# Patient Record
Sex: Female | Born: 1960 | Race: Black or African American | Hispanic: No | Marital: Single | Smoking: Never smoker
Health system: Southern US, Community
[De-identification: ages and names within clinical notes are randomized; demographics above are authoritative.]

## PROBLEM LIST (undated history)

## (undated) ENCOUNTER — Non-Acute Institutional Stay: Payer: MEDICAID

## (undated) ENCOUNTER — Ambulatory Visit

## (undated) ENCOUNTER — Encounter: Attending: Geriatric Medicine | Primary: Geriatric Medicine

## (undated) ENCOUNTER — Ambulatory Visit
Payer: MEDICAID | Attending: Rehabilitative and Restorative Service Providers" | Primary: Rehabilitative and Restorative Service Providers"

## (undated) ENCOUNTER — Encounter

## (undated) ENCOUNTER — Telehealth

## (undated) ENCOUNTER — Ambulatory Visit: Payer: MEDICAID

## (undated) ENCOUNTER — Non-Acute Institutional Stay: Payer: MEDICAID | Attending: Geriatric Medicine | Primary: Geriatric Medicine

## (undated) ENCOUNTER — Ambulatory Visit: Payer: MEDICAID | Attending: Nephrology | Primary: Nephrology

## (undated) ENCOUNTER — Encounter: Attending: Adult Health | Primary: Adult Health

## (undated) ENCOUNTER — Ambulatory Visit: Attending: Radiation Oncology | Primary: Radiation Oncology

## (undated) ENCOUNTER — Encounter: Attending: Radiation Oncology | Primary: Radiation Oncology

## (undated) ENCOUNTER — Encounter: Attending: Pharmacist | Primary: Pharmacist

## (undated) ENCOUNTER — Inpatient Hospital Stay

## (undated) ENCOUNTER — Encounter: Payer: MEDICAID | Attending: Geriatric Medicine | Primary: Geriatric Medicine

## (undated) ENCOUNTER — Encounter: Attending: Anesthesiology | Primary: Anesthesiology

## (undated) ENCOUNTER — Non-Acute Institutional Stay: Payer: MEDICAID | Attending: Hematology & Oncology | Primary: Hematology & Oncology

## (undated) ENCOUNTER — Encounter: Payer: MEDICAID | Attending: Nephrology | Primary: Nephrology

## (undated) ENCOUNTER — Encounter
Payer: MEDICAID | Attending: Rehabilitative and Restorative Service Providers" | Primary: Rehabilitative and Restorative Service Providers"

## (undated) ENCOUNTER — Telehealth: Attending: Hematology & Oncology | Primary: Hematology & Oncology

## (undated) ENCOUNTER — Telehealth: Attending: Internal Medicine | Primary: Internal Medicine

## (undated) ENCOUNTER — Telehealth: Attending: Geriatric Medicine | Primary: Geriatric Medicine

## (undated) ENCOUNTER — Telehealth: Attending: Pharmacist | Primary: Pharmacist

## (undated) ENCOUNTER — Ambulatory Visit: Attending: Orthopaedic Surgery | Primary: Orthopaedic Surgery

## (undated) ENCOUNTER — Ambulatory Visit: Payer: MEDICAID | Attending: Adult Health | Primary: Adult Health

## (undated) ENCOUNTER — Encounter: Payer: MEDICAID | Attending: Adult Health | Primary: Adult Health

## (undated) ENCOUNTER — Telehealth
Attending: Pharmacist Clinician (PhC)/ Clinical Pharmacy Specialist | Primary: Pharmacist Clinician (PhC)/ Clinical Pharmacy Specialist

## (undated) ENCOUNTER — Encounter: Payer: MEDICAID | Attending: Hematology & Oncology | Primary: Hematology & Oncology

## (undated) ENCOUNTER — Ambulatory Visit
Payer: MEDICAID | Attending: Student in an Organized Health Care Education/Training Program | Primary: Student in an Organized Health Care Education/Training Program

## (undated) ENCOUNTER — Ambulatory Visit
Attending: Rehabilitative and Restorative Service Providers" | Primary: Rehabilitative and Restorative Service Providers"

## (undated) DIAGNOSIS — E119 Type 2 diabetes mellitus without complications: Secondary | ICD-10-CM

## (undated) DIAGNOSIS — I10 Essential (primary) hypertension: Secondary | ICD-10-CM

## (undated) HISTORY — PX: ABDOMINAL HYSTERECTOMY: SHX81

---

## 2005-01-19 ENCOUNTER — Emergency Department: Payer: Self-pay | Admitting: Emergency Medicine

## 2007-10-29 ENCOUNTER — Other Ambulatory Visit: Payer: Self-pay

## 2007-10-29 ENCOUNTER — Emergency Department: Payer: Self-pay | Admitting: Emergency Medicine

## 2008-05-23 ENCOUNTER — Emergency Department: Payer: Self-pay | Admitting: Emergency Medicine

## 2013-08-05 ENCOUNTER — Inpatient Hospital Stay: Payer: Self-pay | Admitting: Internal Medicine

## 2013-08-05 DIAGNOSIS — I517 Cardiomegaly: Secondary | ICD-10-CM

## 2013-08-05 LAB — TROPONIN I
Troponin-I: <0.02 ng/mL
Troponin-I: <0.02 ng/mL

## 2013-08-05 LAB — BASIC METABOLIC PANEL
Anion Gap: 7 (ref 7–16)
BUN: 17 mg/dL (ref 7–18)
Calcium, Total: 8.6 mg/dL (ref 8.5–10.1)
Chloride: 106 mmol/L (ref 98–107)
Co2: 26 mmol/L (ref 21–32)
Creatinine: 1.01 mg/dL (ref 0.60–1.30)
EGFR (Non-African Amer.): >60
Glucose: 238 mg/dL — ABNORMAL HIGH (ref 65–99)
Osmolality: 287 (ref 275–301)
Potassium: 3.5 mmol/L (ref 3.5–5.1)
Sodium: 139 mmol/L (ref 136–145)

## 2013-08-05 LAB — CBC
HCT: 40.7 % (ref 35.0–47.0)
HGB: 13.6 g/dL (ref 12.0–16.0)
MCH: 31 pg (ref 26.0–34.0)
MCHC: 33.4 g/dL (ref 32.0–36.0)
MCV: 93 fL (ref 80–100)
Platelet: 270 10*3/uL (ref 150–440)
RBC: 4.39 10*6/uL (ref 3.80–5.20)
RDW: 13.6 % (ref 11.5–14.5)
WBC: 9.8 10*3/uL (ref 3.6–11.0)

## 2013-08-05 LAB — LIPID PANEL
Cholesterol: 200 mg/dL (ref 0–200)
HDL Cholesterol: 69 mg/dL — ABNORMAL HIGH (ref 40–60)
Ldl Cholesterol, Calc: 106 mg/dL — ABNORMAL HIGH (ref 0–100)
Triglycerides: 123 mg/dL (ref 0–200)
VLDL CHOLESTEROL, CALC: 25 mg/dL (ref 5–40)

## 2013-08-05 LAB — DRUG SCREEN, URINE
Amphetamines, Ur Screen: NEGATIVE (ref ?–1000)
Barbiturates, Ur Screen: NEGATIVE (ref ?–200)
Benzodiazepine, Ur Scrn: NEGATIVE (ref ?–200)
CANNABINOID 50 NG, UR ~~LOC~~: POSITIVE (ref ?–50)
Cocaine Metabolite,Ur ~~LOC~~: NEGATIVE (ref ?–300)
MDMA (Ecstasy)Ur Screen: NEGATIVE (ref ?–500)
Methadone, Ur Screen: NEGATIVE (ref ?–300)
Opiate, Ur Screen: NEGATIVE (ref ?–300)
Phencyclidine (PCP) Ur S: NEGATIVE (ref ?–25)
TRICYCLIC, UR SCREEN: NEGATIVE (ref ?–1000)

## 2013-08-05 LAB — CK TOTAL AND CKMB (NOT AT ARMC)
CK, Total: 112 U/L
CK, Total: 108 U/L
CK, Total: 98 U/L
CK-MB: 1.2 ng/mL (ref 0.5–3.6)
CK-MB: 0.8 ng/mL (ref 0.5–3.6)
CK-MB: 0.7 ng/mL (ref 0.5–3.6)

## 2013-08-05 LAB — HEMOGLOBIN A1C: Hemoglobin A1C: 8.2 % — ABNORMAL HIGH (ref 4.2–6.3)

## 2013-08-05 LAB — D-DIMER(ARMC): D-DIMER: 561 ng/mL

## 2013-08-05 LAB — PRO B NATRIURETIC PEPTIDE: B-Type Natriuretic Peptide: 876 pg/mL — ABNORMAL HIGH (ref 0–125)

## 2013-08-06 LAB — BASIC METABOLIC PANEL
Anion Gap: 9 (ref 7–16)
BUN: 19 mg/dL — ABNORMAL HIGH (ref 7–18)
CHLORIDE: 97 mmol/L — AB (ref 98–107)
CO2: 26 mmol/L (ref 21–32)
Calcium, Total: 9.2 mg/dL (ref 8.5–10.1)
Creatinine: 1.09 mg/dL (ref 0.60–1.30)
EGFR (African American): >60
GFR CALC NON AF AMER: 57 — AB
GLUCOSE: 235 mg/dL — AB (ref 65–99)
Osmolality: 274 (ref 275–301)
Potassium: 3.4 mmol/L — ABNORMAL LOW (ref 3.5–5.1)
SODIUM: 132 mmol/L — AB (ref 136–145)

## 2013-08-06 LAB — CBC WITH DIFFERENTIAL/PLATELET
BASOS PCT: 0.8 %
Basophil #: 0.1 10*3/uL (ref 0.0–0.1)
Eosinophil #: 0 10*3/uL (ref 0.0–0.7)
Eosinophil %: 0.4 %
HCT: 40.6 % (ref 35.0–47.0)
HGB: 13.8 g/dL (ref 12.0–16.0)
LYMPHS PCT: 15.2 %
Lymphocyte #: 1.8 10*3/uL (ref 1.0–3.6)
MCH: 30.8 pg (ref 26.0–34.0)
MCHC: 33.9 g/dL (ref 32.0–36.0)
MCV: 91 fL (ref 80–100)
Monocyte #: 1.2 x10 3/mm — ABNORMAL HIGH (ref 0.2–0.9)
Monocyte %: 10.2 %
Neutrophil #: 8.6 10*3/uL — ABNORMAL HIGH (ref 1.4–6.5)
Neutrophil %: 73.4 %
Platelet: 275 10*3/uL (ref 150–440)
RBC: 4.47 10*6/uL (ref 3.80–5.20)
RDW: 13.5 % (ref 11.5–14.5)
WBC: 11.8 10*3/uL — ABNORMAL HIGH (ref 3.6–11.0)

## 2013-08-06 LAB — MAGNESIUM: MAGNESIUM: 1.7 mg/dL — AB

## 2013-08-22 ENCOUNTER — Emergency Department: Payer: Self-pay | Admitting: Emergency Medicine

## 2013-08-22 LAB — BASIC METABOLIC PANEL
Anion Gap: 5 — ABNORMAL LOW (ref 7–16)
BUN: 20 mg/dL — ABNORMAL HIGH (ref 7–18)
CO2: 26 mmol/L (ref 21–32)
Calcium, Total: 9.3 mg/dL (ref 8.5–10.1)
Chloride: 105 mmol/L (ref 98–107)
Creatinine: 1.09 mg/dL (ref 0.60–1.30)
EGFR (African American): >60
EGFR (Non-African Amer.): 57 — ABNORMAL LOW
Glucose: 223 mg/dL — ABNORMAL HIGH (ref 65–99)
OSMOLALITY: 281 (ref 275–301)
Potassium: 3.3 mmol/L — ABNORMAL LOW (ref 3.5–5.1)
Sodium: 136 mmol/L (ref 136–145)

## 2013-08-22 LAB — CBC
HCT: 41.1 % (ref 35.0–47.0)
HGB: 13.8 g/dL (ref 12.0–16.0)
MCH: 30.9 pg (ref 26.0–34.0)
MCHC: 33.6 g/dL (ref 32.0–36.0)
MCV: 92 fL (ref 80–100)
PLATELETS: 329 10*3/uL (ref 150–440)
RBC: 4.46 10*6/uL (ref 3.80–5.20)
RDW: 13.7 % (ref 11.5–14.5)
WBC: 10.6 10*3/uL (ref 3.6–11.0)

## 2013-08-22 LAB — TROPONIN I

## 2013-08-22 LAB — PRO B NATRIURETIC PEPTIDE: B-Type Natriuretic Peptide: 1038 pg/mL — ABNORMAL HIGH (ref 0–125)

## 2014-04-30 ENCOUNTER — Emergency Department: Payer: Self-pay | Admitting: Emergency Medicine

## 2014-04-30 LAB — CBC WITH DIFFERENTIAL/PLATELET
Basophil #: 0.1 10*3/uL (ref 0.0–0.1)
Basophil %: 0.5 %
EOS PCT: 0 %
Eosinophil #: 0 10*3/uL (ref 0.0–0.7)
HCT: 45.5 % (ref 35.0–47.0)
HGB: 15.1 g/dL (ref 12.0–16.0)
Lymphocyte #: 0.9 10*3/uL — ABNORMAL LOW (ref 1.0–3.6)
Lymphocyte %: 7.4 %
MCH: 30.6 pg (ref 26.0–34.0)
MCHC: 33.2 g/dL (ref 32.0–36.0)
MCV: 92 fL (ref 80–100)
MONOS PCT: 3.9 %
Monocyte #: 0.5 x10 3/mm (ref 0.2–0.9)
NEUTROS ABS: 10.5 10*3/uL — AB (ref 1.4–6.5)
Neutrophil %: 88.2 %
Platelet: 230 10*3/uL (ref 150–440)
RBC: 4.94 10*6/uL (ref 3.80–5.20)
RDW: 13.5 % (ref 11.5–14.5)
WBC: 11.9 10*3/uL — AB (ref 3.6–11.0)

## 2014-04-30 LAB — COMPREHENSIVE METABOLIC PANEL
Albumin: 3.8 g/dL (ref 3.4–5.0)
Alkaline Phosphatase: 61 U/L (ref 46–116)
Anion Gap: 12 (ref 7–16)
BUN: 11 mg/dL (ref 7–18)
Bilirubin,Total: 0.5 mg/dL (ref 0.2–1.0)
CHLORIDE: 96 mmol/L — AB (ref 98–107)
Calcium, Total: 9.2 mg/dL (ref 8.5–10.1)
Co2: 26 mmol/L (ref 21–32)
Creatinine: 1.18 mg/dL (ref 0.60–1.30)
EGFR (Non-African Amer.): 51 — ABNORMAL LOW
Glucose: 303 mg/dL — ABNORMAL HIGH (ref 65–99)
OSMOLALITY: 279 (ref 275–301)
POTASSIUM: 3.1 mmol/L — AB (ref 3.5–5.1)
SGOT(AST): 21 U/L (ref 15–37)
SGPT (ALT): 22 U/L (ref 14–63)
SODIUM: 134 mmol/L — AB (ref 136–145)
Total Protein: 7.9 g/dL (ref 6.4–8.2)

## 2014-04-30 LAB — LIPASE, BLOOD: Lipase: 153 U/L (ref 73–393)

## 2014-07-19 NOTE — H&P (Signed)
PATIENT NAME:  Margaret Herrera, Margaret Herrera MR#:  [id number] DATE OF BIRTH:  1960-12-05  DATE OF ADMISSION:  08/05/2013  ADMITTING PHYSICIAN: Gladstone Lighter, MD  PRIMARY CARE PHYSICIAN: [facility]   CHIEF COMPLAINT: Chest tightness and difficulty breathing.   HISTORY OF PRESENT ILLNESS: Margaret Herrera is a 54 year old African American female with past medical history significant for hypertension and insulin-dependent diabetes mellitus who was doing fine up until late last night, presents to the hospital secondary to sudden onset of dyspnea that woke her up from the middle of the sleep associated with chest tightness. The patient works regularly. She works from home. She has desk job. She was feeling fine all day yesterday. She went to bed last night and suddenly woke up in the middle of the night around 1:00 a.m. with shortness of breath, could not breathe at all, could not lie flat, associated with chest tightness and some nausea. She presented to the ER. She was hypoxic. She could not speak in full sentences and very tachypneic. She was placed on a BiPAP and chest x-ray showed pulmonary congestion so she is being treated for pulmonary edema at this time. Also noted her blood pressure systolic was greater than 938H and diastolic greater than 829H in spite of giving her medications to get it down. She is now started on a nitro drip. The patient says that she had a similar episode about a few months ago at [facility]. They did not admit her, monitored in the ER, managed her blood pressure and asked her to follow up with [facility] clinic. She did follow-up with her PCP and her blood pressure medication was changed. However, she is only on lisinopril 10 mg tablet and it seems inadequate to control her blood pressure at this time.   PAST MEDICAL HISTORY: 1.  Hypertension.  2.  Insulin-dependent diabetes mellitus.   PAST SURGICAL HISTORY: C-section.   ALLERGIES TO MEDICATIONS: No known drug allergies.    CURRENT HOME MEDICATIONS:  1.  Lisinopril 10 mg p.o. daily. 2.  Lantus 15 units subcutaneous daily.   SOCIAL HISTORY: Lives at home with her son. 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 No smoking or alcohol use.   FAMILY HISTORY: Dad with diabetes mellitus.  REVIEW OF SYSTEMS: CONSTITUTIONAL: No fever, fatigue or weakness.  EYES: No blurred vision, double vision, inflammation or glaucoma.  ENT: No tinnitus, ear pain, hearing loss, epistaxis or discharge.  RESPIRATORY: No cough, wheeze, or hemoptysis. Positive for dyspnea on exertion. No COPD.  CARDIOVASCULAR: Positive for chest tightness. Positive for orthopnea. No pedal edema, arrhythmia. Positive for dyspnea on exertion. No palpitations or syncope.  GASTROINTESTINAL: Positive for nausea. No vomiting, diarrhea, hematemesis, melena, or abdominal pain.  GENITOURINARY: No dysuria, hematuria, renal calculus, frequency, or incontinence.  ENDOCRINE: No polyuria, nocturia, thyroid problems, heat or cold intolerance.  HEMATOLOGY: No anemia, easy bruising or bleeding.  SKIN: No acne, rash or lesions.  MUSCULOSKELETAL: No neck, back, shoulder pain, arthritis or gout.  NEUROLOGIC: No numbness, weakness, CVA, TIA or seizures.  PSYCHOLOGICAL: No anxiety, insomnia or depression.   PHYSICAL EXAMINATION: VITAL SIGNS: Temperature 98.5 degrees Fahrenheit, pulse 103, respirations 28, blood pressure right now is 210/115 and sats 95% on 30% FiO2 on 12/5 pressures of BiPAP.  GENERAL: Well-built, well-nourished female lying in bed, not in any acute distress.  HEENT: Normocephalic, atraumatic. Pupils equal, round, and reacting to light. Anicteric sclerae. Extraocular movements intact. Oropharynx is clear without erythema, mass or exudates.  NECK: Supple. No  thyromegaly, JVD or carotid bruits. No lymphadenopathy.  LUNGS: She is moving air bilaterally. Fine bibasilar or crackles heard. No rhonchi or wheezes. No use of accessory muscles for breathing  at this time. Currently on a BiPAP.  CARDIOVASCULAR: S1, S2 regular rate and rhythm. No murmurs, rubs, or gallops.  ABDOMEN: Soft, nontender, nondistended. No hepatosplenomegaly. Normal bowel sounds.  EXTREMITIES: No pedal edema. No clubbing or cyanosis. 2+ dorsalis pedis pulses palpable bilaterally.  SKIN: No acne, rash or lesions.  LYMPHATICS: No cervical or inguinal lymphadenopathy.  NEUROLOGIC: Cranial nerves II through XII remain grossly intact. Motor strength 5/5 in all 4 extremities. Sensation is intact. No cerebellar dysfunction signs seen.  PSYCHOLOGICAL: The patient is awake, alert, and oriented x3.   DIAGNOSTIC DATA: WBC 9.8, hemoglobin 13.6, hematocrit 40.7, platelet count 270,000.   Sodium 139, potassium 3.5, chloride 106, bicarb 26, BUN 17, creatinine 1, glucose 238, and calcium 8.6. BNP is elevated at 876.  Chest x-ray is showing borderline heart size without vascular congestion. No airspace disease or consolidation noted.  First troponin is negative.   CT of the chest is showing degree of right-sided congestive heart failure with bibasilar consolidation, pleural effusions bilaterally seen. No pulmonary embolus noted.   EKG is showing sinus tachycardia with LVH pattern, heart rate of 104.   ASSESSMENT AND PLAN: This is a 54 year old female with history of hypertension and insulin-dependent diabetes mellitus presented with sudden onset of dyspnea, hypoxemia, and chest tightness. 1.  Acute respiratory distress with tachypnea and inability to speak in full sentences secondary to acute pulmonary edema. Likely triggered by malignant hypertension. Echo has been ordered. She is started on IV Lasix at this time. Followup chest x-ray in the a.m. CT of the chest did not show any PE, other than pulmonary vascular congestion and pleural effusions. She will be on BiPAP and be monitored in CCU.  2.  Malignant hypertension. Currently on nitro drip. Continue and try to wean off. I am starting  her on lisinopril/HCTZ and metoprolol.  3.  Insulin-dependent diabetes mellitus. Restart Lantus, check HbA1c and also on sliding scale insulin.  4.  Deep vein thrombosis prophylaxis with Lovenox.   CODE STATUS: FULL.  CRITICAL CARE TIME SPENT ON ADMISSION: 60 minutes.  ____________________________ Gladstone Lighter, MD rk:sb D: 08/05/2013 08:23:25 ET T: 08/05/2013 08:54:18 ET JOB#: [id number]  cc: Gladstone Lighter, MD, <Dictator> [facility] Gladstone Lighter MD ELECTRONICALLY SIGNED 08/05/2013 13:38

## 2014-07-19 NOTE — Discharge Summary (Signed)
PATIENT NAME:  Margaret Herrera, EBANKS MR#:  [id number] DATE OF BIRTH:  Aug 10, 1960  DATE OF ADMISSION:  08/05/2013 DATE OF DISCHARGE:  08/07/2013  DISCHARGE DIAGNOSES: 1.  Acute on chronic systolic and diastolic heart failure.  2.  Malignant hypertension.  3.  Diabetes mellitus type 2.  4.  Hyperlipidemia.   DISCHARGE MEDICATIONS: 1.  Lisinopril 10 mg p.o. daily.  2.  Lantus 15 units daily.  3.  Atorvastatin 20 mg p.o. daily.  4.  Hydralazine 50 mg p.o. t.i.d.  5.  Lasix 40 mg p.o. daily.  6.  Metoprolol tartrate 25 mg p.o. b.i.d.   Please note that all of her medications, except lisinopril and Lasix, are new. The patient's lisinopril and Lantus are home medications, but the Lopressor, statin and hydralazine are new medications.   DIET: Low sodium, low fat, low cholesterol.   CONSULTATIONS:  None.   HOSPITAL COURSE: This patient is a 54 year old female patient who came in because of chest tightness and difficulty breathing. The patient's blood pressure was very elevated on admission to systolic 163/846. The patient could not speak full sentences and she also was hypoxic, according to history and physical. The patient was admitted to ICU because of acute respiratory failure with inability to talk due to pulmonary edema and malignant hypertension. The patient was started on nitro drip. Her lab data on admission was unremarkable. CT of the chest showed right-sided CHF with consolidation. The patient's chest x-ray showed vascular congestion without any infiltrate. Labs on admission showed sodium 139, potassium 3.5, chloride 106, bicarb 26, BUN 17, creatinine 1. The patient was admitted to ICU.  CT chest showed vascular congestion and pleural effusion and did not show any PE. The patient was in ICU the 11th and 12th, and she was on nitro drip on May 11th  and 12th; that is more than 24 hours due to persistent elevated blood pressure. The patient was started on p.o. metoprolol along with hydralazine and we  weaned off the nitro drip.  The patient's echocardiogram showed EF of 25% to 30% with severely decreased LV function and impaired relaxation of LV diastolic filling. The patient's blood pressure nicely improved off the nitro drip, and the patient able to take the p.o. medications, so on the 13th, in the morning when I saw her, she was asymptomatic, did not have any trouble breathing. The patient's blood pressure stayed at around 146/83 and heart rate of 80, so we discharged her home in stable condition, wrote prescription for metoprolol, hydralazine and also statins. The patient went home in stable condition.   DISCHARGE PHYSICAL EXAMINATION:  VITAL SIGNS:  Temperature is 98.6, heart rate 82, blood pressure 150/90.  CARDIOVASCULAR:  Rate regular.  LUNGS:  Clear to auscultation. No wheeze. No rales.   .  IMAGING AND LABORATORY DATA DURING THE HOSPITAL DAY: Chest x-ray on May 12th showed resolution of pulmonary edema. WBC, on May 12th, 11.8, hemoglobin 13.8, hematocrit 40.6, platelets 275. Electrolytes, on May 12th showed sodium 132, potassium 3.4, chloride 97, bicarb 20, BUN 19, creatinine 1, glucose 235.  The patient's echocardiogram showed EF of 25% to 30% with moderately severe decreased LV function and also impaired relaxation pattern of LV diastolic filling.  The patient's troponins have been negative. CT angio chest showed no pulmonary emboli. The patient has right-sided CHF, bibasilar consolidation. The patient's urine toxicology is positive for cannabinoids. The patient had LDL of 106.  Hemoglobin A1c of 8.2. EKG showed sinus tachycardia, 104 beats per minute on admission.  TIME SPENT ON DISCHARGE PREPARATION:  More than 35 minutes and discharged home.   ____________________________ Epifanio Lesches, MD sk:dmm D: 08/09/2013 12:16:56 ET T: 08/09/2013 13:27:00 ET JOB#: [id number]  cc: Epifanio Lesches, MD, <Dictator> Epifanio Lesches MD ELECTRONICALLY SIGNED 08/21/2013 9:48

## 2014-09-30 ENCOUNTER — Other Ambulatory Visit: Payer: Self-pay | Admitting: Obstetrics and Gynecology

## 2014-09-30 DIAGNOSIS — Z1239 Encounter for other screening for malignant neoplasm of breast: Secondary | ICD-10-CM

## 2015-03-22 ENCOUNTER — Emergency Department
Admission: EM | Admit: 2015-03-22 | Discharge: 2015-03-22 | Disposition: A | Discharge status: Home / Self Care | Payer: BLUE CROSS/BLUE SHIELD | Attending: Emergency Medicine | Admitting: Emergency Medicine

## 2015-03-22 ENCOUNTER — Emergency Department: Payer: BLUE CROSS/BLUE SHIELD

## 2015-03-22 ENCOUNTER — Encounter: Payer: Self-pay | Admitting: Emergency Medicine

## 2015-03-22 DIAGNOSIS — Z79899 Other long term (current) drug therapy: Secondary | ICD-10-CM | POA: Diagnosis not present

## 2015-03-22 DIAGNOSIS — E1165 Type 2 diabetes mellitus with hyperglycemia: Secondary | ICD-10-CM | POA: Diagnosis not present

## 2015-03-22 DIAGNOSIS — L700 Acne vulgaris: Secondary | ICD-10-CM | POA: Insufficient documentation

## 2015-03-22 DIAGNOSIS — I10 Essential (primary) hypertension: Secondary | ICD-10-CM | POA: Insufficient documentation

## 2015-03-22 DIAGNOSIS — L709 Acne, unspecified: Secondary | ICD-10-CM

## 2015-03-22 DIAGNOSIS — R739 Hyperglycemia, unspecified: Secondary | ICD-10-CM

## 2015-03-22 DIAGNOSIS — Z794 Long term (current) use of insulin: Secondary | ICD-10-CM | POA: Insufficient documentation

## 2015-03-22 DIAGNOSIS — M6281 Muscle weakness (generalized): Secondary | ICD-10-CM | POA: Insufficient documentation

## 2015-03-22 DIAGNOSIS — R531 Weakness: Secondary | ICD-10-CM

## 2015-03-22 HISTORY — DX: Essential (primary) hypertension: I10

## 2015-03-22 HISTORY — DX: Type 2 diabetes mellitus without complications: E11.9

## 2015-03-22 LAB — COMPREHENSIVE METABOLIC PANEL
ALT: 18 U/L (ref 14–54)
AST: 21 U/L (ref 15–41)
Albumin: 4.1 g/dL (ref 3.5–5.0)
Alkaline Phosphatase: 47 U/L (ref 38–126)
Anion gap: 8 (ref 5–15)
BUN: 48 mg/dL — ABNORMAL HIGH (ref 6–20)
CHLORIDE: 102 mmol/L (ref 101–111)
CO2: 24 mmol/L (ref 22–32)
CREATININE: 1.38 mg/dL — AB (ref 0.44–1.00)
Calcium: 8.9 mg/dL (ref 8.9–10.3)
GFR, EST AFRICAN AMERICAN: 49 mL/min — AB (ref >60)
GFR, EST NON AFRICAN AMERICAN: 42 mL/min — AB (ref >60)
Glucose, Bld: 310 mg/dL — ABNORMAL HIGH (ref 65–99)
Potassium: 4.2 mmol/L (ref 3.5–5.1)
Sodium: 134 mmol/L — ABNORMAL LOW (ref 135–145)
Total Bilirubin: 0.9 mg/dL (ref 0.3–1.2)
Total Protein: 7.1 g/dL (ref 6.5–8.1)

## 2015-03-22 LAB — CBC WITH DIFFERENTIAL/PLATELET
Basophils Absolute: 0.1 10*3/uL (ref 0–0.1)
Basophils Relative: 2 %
EOS PCT: 3 %
Eosinophils Absolute: 0.2 10*3/uL (ref 0–0.7)
HCT: 37.3 % (ref 35.0–47.0)
Hemoglobin: 12.7 g/dL (ref 12.0–16.0)
LYMPHS ABS: 2.2 10*3/uL (ref 1.0–3.6)
LYMPHS PCT: 30 %
MCH: 31 pg (ref 26.0–34.0)
MCHC: 34 g/dL (ref 32.0–36.0)
MCV: 91.3 fL (ref 80.0–100.0)
MONO ABS: 0.9 10*3/uL (ref 0.2–0.9)
MONOS PCT: 12 %
Neutro Abs: 4 10*3/uL (ref 1.4–6.5)
Neutrophils Relative %: 53 %
PLATELETS: 242 10*3/uL (ref 150–440)
RBC: 4.08 MIL/uL (ref 3.80–5.20)
RDW: 13.3 % (ref 11.5–14.5)
WBC: 7.5 10*3/uL (ref 3.6–11.0)

## 2015-03-22 LAB — URINALYSIS COMPLETE WITH MICROSCOPIC (ARMC ONLY)
BILIRUBIN URINE: NEGATIVE
Bacteria, UA: NONE SEEN
Hgb urine dipstick: NEGATIVE
KETONES UR: NEGATIVE mg/dL
LEUKOCYTES UA: NEGATIVE
Nitrite: NEGATIVE
PH: 5 (ref 5.0–8.0)
Protein, ur: 100 mg/dL — AB
Specific Gravity, Urine: 1.01 (ref 1.005–1.030)

## 2015-03-22 LAB — GLUCOSE, CAPILLARY: Glucose-Capillary: 304 mg/dL — ABNORMAL HIGH (ref 65–99)

## 2015-03-22 LAB — TROPONIN I

## 2015-03-22 LAB — ETHANOL: ALCOHOL ETHYL (B): 5 mg/dL — AB (ref <5)

## 2015-03-22 MED ORDER — SODIUM CHLORIDE 0.9 % IV BOLUS (SEPSIS)
1000.0000 mL | Freq: Once | INTRAVENOUS | Status: AC
  Administered 2015-03-22: 1000 mL via INTRAVENOUS

## 2015-03-22 NOTE — ED Notes (Signed)
Pt reports lower extremity weakness intermittent x 1 month, most recent episode starting a few hours ago.  Pt also states hyperglycemia, pt hx insulin dependence diabetes.  Pt denies any pain, respirations equal and unlabored skin warm and dry.

## 2015-03-22 NOTE — ED Notes (Signed)
NAD noted at time of D/C. Pt denies questions or concerns. Pt ambulatory to the lobby at this time.  

## 2015-03-22 NOTE — Discharge Instructions (Signed)
1. Drink plenty of fluids daily. 2. Return to the ER for worsening symptoms, persistent vomiting, difficulty breathing or other concerns.  Hyperglycemia Hyperglycemia occurs when the glucose (sugar) in your blood is too high. Hyperglycemia can happen for many reasons, but it most often happens to people who do not know they have diabetes or are not managing their diabetes properly.  CAUSES  Whether you have diabetes or not, there are other causes of hyperglycemia. Hyperglycemia can occur when you have diabetes, but it can also occur in other situations that you might not be as aware of, such as: Diabetes  If you have diabetes and are having problems controlling your blood glucose, hyperglycemia could occur because of some of the following reasons:  Not following your meal plan.  Not taking your diabetes medications or not taking it properly.  Exercising less or doing less activity than you normally do.  Being sick. Pre-diabetes  This cannot be ignored. Before people develop Type 2 diabetes, they almost always have "pre-diabetes." This is when your blood glucose levels are higher than normal, but not yet high enough to be diagnosed as diabetes. Research has shown that some long-term damage to the body, especially the heart and circulatory system, may already be occurring during pre-diabetes. If you take action to manage your blood glucose when you have pre-diabetes, you may delay or prevent Type 2 diabetes from developing. Stress  If you have diabetes, you may be "diet" controlled or on oral medications or insulin to control your diabetes. However, you may find that your blood glucose is higher than usual in the hospital whether you have diabetes or not. This is often referred to as "stress hyperglycemia." Stress can elevate your blood glucose. This happens because of hormones put out by the body during times of stress. If stress has been the cause of your high blood glucose, it can be followed  regularly by your caregiver. That way he/she can make sure your hyperglycemia does not continue to get worse or progress to diabetes. Steroids  Steroids are medications that act on the infection fighting system (immune system) to block inflammation or infection. One side effect can be a rise in blood glucose. Most people can produce enough extra insulin to allow for this rise, but for those who cannot, steroids make blood glucose levels go even higher. It is not unusual for steroid treatments to "uncover" diabetes that is developing. It is not always possible to determine if the hyperglycemia will go away after the steroids are stopped. A special blood test called an A1c is sometimes done to determine if your blood glucose was elevated before the steroids were started. SYMPTOMS  Thirsty.  Frequent urination.  Dry mouth.  Blurred vision.  Tired or fatigue.  Weakness.  Sleepy.  Tingling in feet or leg. DIAGNOSIS  Diagnosis is made by monitoring blood glucose in one or all of the following ways:  A1c test. This is a chemical found in your blood.  Fingerstick blood glucose monitoring.  Laboratory results. TREATMENT  First, knowing the cause of the hyperglycemia is important before the hyperglycemia can be treated. Treatment may include, but is not be limited to:  Education.  Change or adjustment in medications.  Change or adjustment in meal plan.  Treatment for an illness, infection, etc.  More frequent blood glucose monitoring.  Change in exercise plan.  Decreasing or stopping steroids.  Lifestyle changes. HOME CARE INSTRUCTIONS   Test your blood glucose as directed.  Exercise regularly. Your  caregiver will give you instructions about exercise. Pre-diabetes or diabetes which comes on with stress is helped by exercising.  Eat wholesome, balanced meals. Eat often and at regular, fixed times. Your caregiver or nutritionist will give you a meal plan to guide your sugar  intake.  Being at an ideal weight is important. If needed, losing as little as 10 to 15 pounds may help improve blood glucose levels. SEEK MEDICAL CARE IF:   You have questions about medicine, activity, or diet.  You continue to have symptoms (problems such as increased thirst, urination, or weight gain). SEEK IMMEDIATE MEDICAL CARE IF:   You are vomiting or have diarrhea.  Your breath smells fruity.  You are breathing faster or slower.  You are very sleepy or incoherent.  You have numbness, tingling, or pain in your feet or hands.  You have chest pain.  Your symptoms get worse even though you have been following your caregiver's orders.  If you have any other questions or concerns.   This information is not intended to replace advice given to you by your health care provider. Make sure you discuss any questions you have with your health care provider.   Document Released: 09/07/2000 Document Revised: 06/06/2011 Document Reviewed: 11/18/2014 Elsevier Interactive Patient Education 2016 [employer]  Weakness Weakness is a lack of strength. It may be felt all over the body (generalized) or in one specific part of the body (focal). Some causes of weakness can be serious. You may need further medical evaluation, especially if you are elderly or you have a history of immunosuppression (such as chemotherapy or HIV), kidney disease, heart disease, or diabetes. CAUSES  Weakness can be caused by many different things, including:  Infection.  Physical exhaustion.  Internal bleeding or other blood loss that results in a lack of red blood cells (anemia).  Dehydration. This cause is more common in elderly people.  Side effects or electrolyte abnormalities from medicines, such as pain medicines or sedatives.  Emotional distress, anxiety, or depression.  Circulation problems, especially severe peripheral arterial disease.  Heart disease, such as rapid atrial fibrillation,  bradycardia, or heart failure.  Nervous system disorders, such as Guillain-Barr syndrome, multiple sclerosis, or stroke. DIAGNOSIS  To find the cause of your weakness, your caregiver will take your history and perform a physical exam. Lab tests or X-rays may also be ordered, if needed. TREATMENT  Treatment of weakness depends on the cause of your symptoms and can vary greatly. HOME CARE INSTRUCTIONS   Rest as needed.  Eat a well-balanced diet.  Try to get some exercise every day.  Only take over-the-counter or prescription medicines as directed by your caregiver. SEEK MEDICAL CARE IF:   Your weakness seems to be getting worse or spreads to other parts of your body.  You develop new aches or pains. SEEK IMMEDIATE MEDICAL CARE IF:   You cannot perform your normal daily activities, such as getting dressed and feeding yourself.  You cannot walk up and down stairs, or you feel exhausted when you do so.  You have shortness of breath or chest pain.  You have difficulty moving parts of your body.  You have weakness in only one area of the body or on only one side of the body.  You have a fever.  You have trouble speaking or swallowing.  You cannot control your bladder or bowel movements.  You have black or bloody vomit or stools. MAKE SURE YOU:  Understand these instructions.  Will watch  your condition.  Will get help right away if you are not doing well or get worse.   This information is not intended to replace advice given to you by your health care provider. Make sure you discuss any questions you have with your health care provider.   Document Released: 03/14/2005 Document Revised: 09/13/2011 Document Reviewed: 05/13/2011 Elsevier Interactive Patient Education [employer].

## 2015-03-22 NOTE — ED Provider Notes (Signed)
[facility] Emergency Department Provider Note  ____________________________________________  Time seen: Approximately 5:00 AM  I have reviewed the triage vital signs and the nursing notes.   HISTORY  Chief Complaint Extremity Weakness and Hyperglycemia    HPI Margaret Herrera is a 54 y.o. female who presents to the ED from home with a chief complain of weakness. Patient complains of bilateral lower extremity weakness, intermittent x one month. States her PCP attributed to fluid buildup. She was unable to sleep at home tonight secondary to weakness and presents to ED for evaluation. Patient also states she has had some recent medicine changes and notes hyperglycemia. Baseline blood sugars in the 140s; has noted blood sugars in the 400s x the past several days.Spouse denies slurred speech, altered mentation. Patient denies headache, vision changes, focal weakness, fever, chills, chest pain, shortness of breath, abdominal pain, nausea, vomiting, diarrhea. Denies recent travel or trauma. Denies associated back pain, numbness or tingling. Nothing makes her symptoms better or worse.   Past Medical History  Diagnosis Date  . Diabetes mellitus without complication ([place])   . Hypertension     There are no active problems to display for this patient.   History reviewed. No pertinent past surgical history.  Current Outpatient Rx  Name  Route  Sig  Dispense  Refill  . furosemide (LASIX) 40 MG tablet   Oral   Take 40 mg by mouth daily.         . hydrALAZINE (APRESOLINE) 50 MG tablet   Oral   Take 100 mg by mouth 3 (three) times daily.         . hydrochlorothiazide (HYDRODIURIL) 25 MG tablet   Oral   Take 25 mg by mouth daily.         . insulin glargine (LANTUS) 100 UNIT/ML injection   Subcutaneous   Inject 40 Units into the skin daily.         . isosorbide mononitrate (IMDUR) 60 MG 24 hr tablet   Oral   Take 60 mg by mouth daily.         Marland Kitchen  lisinopril (PRINIVIL,ZESTRIL) 10 MG tablet   Oral   Take 10 mg by mouth daily.         . metoprolol (LOPRESSOR) 100 MG tablet   Oral   Take 100 mg by mouth 2 (two) times daily.           Allergies Review of patient's allergies indicates no known allergies.  History reviewed. No pertinent family history.  Social History Social History  Substance Use Topics  . Smoking status: Never Smoker   . Smokeless tobacco: None  . Alcohol Use: No    Review of Systems Constitutional: Positive for lower extremity weakness bilaterally. No fever/chills. Eyes: No visual changes. ENT: No sore throat. Cardiovascular: Denies chest pain. Respiratory: Denies shortness of breath. Gastrointestinal: No abdominal pain.  No nausea, no vomiting.  No diarrhea.  No constipation. Genitourinary: Negative for dysuria. Musculoskeletal: Negative for back pain. Skin: Negative for rash. Neurological: Negative for headaches, focal weakness or numbness.  10-point ROS otherwise negative.  ____________________________________________   PHYSICAL EXAM:  VITAL SIGNS: ED Triage Vitals  Enc Vitals Group     BP 03/22/15 0449 199/83 mmHg     Pulse Rate 03/22/15 0449 66     Resp 03/22/15 0449 19     Temp 03/22/15 0449 98.3 F (36.8 C)     Temp Source 03/22/15 0449 Oral     SpO2 03/22/15  0449 98 %     Weight 03/22/15 0449 180 lb (81.647 kg)     Height 03/22/15 0449 5\' 4"  (1.626 m)     Head Cir --      Peak Flow --      Pain Score 03/22/15 0450 0     Pain Loc --      Pain Edu? --      Excl. in [place]? --     Constitutional: Alert and oriented. Well appearing and in no acute distress. Eyes: Conjunctivae are normal. PERRL. EOMI. Head: Atraumatic. Nose: No congestion/rhinnorhea. Mouth/Throat: Mucous membranes are moist.  Oropharynx non-erythematous. Neck: No stridor.  No carotid bruits. Cardiovascular: Normal rate, regular rhythm. Grossly normal heart sounds.  Good peripheral circulation. Respiratory:  Normal respiratory effort.  No retractions. Lungs CTAB. Gastrointestinal: Soft and nontender. No distention. No abdominal bruits. No CVA tenderness. Musculoskeletal: No lower extremity tenderness nor edema.  No joint effusions. Neurologic:  Normal speech and language. CN II-XII intact. 5/5 motor strength and sensation in all extremities. No gross focal neurologic deficits are appreciated.  Skin:  Skin is warm, dry and intact. No rash noted. Psychiatric: Mood and affect are normal. Speech and behavior are normal.  ____________________________________________   LABS (all labs ordered are listed, but only abnormal results are displayed)  Labs Reviewed  GLUCOSE, CAPILLARY - Abnormal; Notable for the following:    Glucose-Capillary 304 (*)    All other components within normal limits  COMPREHENSIVE METABOLIC PANEL - Abnormal; Notable for the following:    Sodium 134 (*)    Glucose, Bld 310 (*)    BUN 48 (*)    Creatinine, Ser 1.38 (*)    GFR calc non Af Amer 42 (*)    GFR calc Af Amer 49 (*)    All other components within normal limits  URINALYSIS COMPLETEWITH MICROSCOPIC ([facility] ONLY) - Abnormal; Notable for the following:    Color, Urine STRAW (*)    APPearance CLEAR (*)    Glucose, UA >500 (*)    Protein, ur 100 (*)    Squamous Epithelial / LPF 0-5 (*)    All other components within normal limits  CBC WITH DIFFERENTIAL/PLATELET  TROPONIN I  ETHANOL   ____________________________________________  EKG  ED ECG REPORT I, SUNG,JADE J, the attending physician, personally viewed and interpreted this ECG.   Date: 03/22/2015  EKG Time: 0444  Rate: 65  Rhythm: normal EKG, normal sinus rhythm  Axis: Normal  Intervals:none  ST&T Change: Nonspecific  ____________________________________________  RADIOLOGY  CT head without contrast interpreted per Dr. Radene Knee: 1. No acute intracranial pathology seen on CT. 2. Mild small vessel ischemic microangiopathy.  Portable chest x-ray  (viewed by me, interpreted per Dr. Radene Knee): Mild cardiomegaly. Lungs remain grossly clear. ____________________________________________   PROCEDURES  Procedure(s) performed: None  Critical Care performed: No  ____________________________________________   INITIAL IMPRESSION / ASSESSMENT AND PLAN / ED COURSE  Pertinent labs & imaging results that were available during my care of the patient were reviewed by me and considered in my medical decision making (see chart for details).  54 year old female with hyperglycemia and a one-month history of bilateral lower extremity weakness. Will check screening lab work, CT head, initiate IV fluid resuscitation and reassess.  ----------------------------------------- 6:20 AM on 03/22/2015 -----------------------------------------  Patient feeling better. No focal neurological deficits; low suspicion for acute CVA. Strict return precautions given. Patient and spouse verbalize understanding and agree with plan of care. ____________________________________________   FINAL CLINICAL IMPRESSION(S) / ED  DIAGNOSES  Final diagnoses:  Hyperglycemia  Weakness  Hydration acne      Paulette Blanch, MD 03/22/15 [phone]

## 2015-09-29 ENCOUNTER — Emergency Department: Payer: BLUE CROSS/BLUE SHIELD

## 2015-09-29 ENCOUNTER — Emergency Department
Admission: EM | Admit: 2015-09-29 | Discharge: 2015-09-29 | Disposition: A | Discharge status: Home / Self Care | Payer: BLUE CROSS/BLUE SHIELD | Attending: Emergency Medicine | Admitting: Emergency Medicine

## 2015-09-29 ENCOUNTER — Encounter: Payer: Self-pay | Admitting: Emergency Medicine

## 2015-09-29 DIAGNOSIS — Z794 Long term (current) use of insulin: Secondary | ICD-10-CM | POA: Insufficient documentation

## 2015-09-29 DIAGNOSIS — N289 Disorder of kidney and ureter, unspecified: Secondary | ICD-10-CM | POA: Insufficient documentation

## 2015-09-29 DIAGNOSIS — R1084 Generalized abdominal pain: Secondary | ICD-10-CM | POA: Diagnosis not present

## 2015-09-29 DIAGNOSIS — I10 Essential (primary) hypertension: Secondary | ICD-10-CM | POA: Insufficient documentation

## 2015-09-29 DIAGNOSIS — Z7984 Long term (current) use of oral hypoglycemic drugs: Secondary | ICD-10-CM | POA: Insufficient documentation

## 2015-09-29 DIAGNOSIS — R112 Nausea with vomiting, unspecified: Secondary | ICD-10-CM | POA: Diagnosis present

## 2015-09-29 DIAGNOSIS — R739 Hyperglycemia, unspecified: Secondary | ICD-10-CM

## 2015-09-29 DIAGNOSIS — E1165 Type 2 diabetes mellitus with hyperglycemia: Secondary | ICD-10-CM | POA: Diagnosis not present

## 2015-09-29 LAB — URINALYSIS COMPLETE WITH MICROSCOPIC (ARMC ONLY)
Bilirubin Urine: NEGATIVE
Hgb urine dipstick: NEGATIVE
Ketones, ur: NEGATIVE mg/dL
Leukocytes, UA: NEGATIVE
Nitrite: NEGATIVE
PROTEIN: 30 mg/dL — AB
SPECIFIC GRAVITY, URINE: 1.013 (ref 1.005–1.030)
WBC UA: NONE SEEN WBC/hpf (ref 0–5)
pH: 6 (ref 5.0–8.0)

## 2015-09-29 LAB — BASIC METABOLIC PANEL
ANION GAP: 7 (ref 5–15)
BUN: 50 mg/dL — AB (ref 6–20)
CHLORIDE: 103 mmol/L (ref 101–111)
CO2: 24 mmol/L (ref 22–32)
Calcium: 8.7 mg/dL — ABNORMAL LOW (ref 8.9–10.3)
Creatinine, Ser: 1.68 mg/dL — ABNORMAL HIGH (ref 0.44–1.00)
GFR, EST AFRICAN AMERICAN: 38 mL/min — AB (ref >60)
GFR, EST NON AFRICAN AMERICAN: 33 mL/min — AB (ref >60)
Glucose, Bld: 321 mg/dL — ABNORMAL HIGH (ref 65–99)
POTASSIUM: 4.2 mmol/L (ref 3.5–5.1)
SODIUM: 134 mmol/L — AB (ref 135–145)

## 2015-09-29 LAB — COMPREHENSIVE METABOLIC PANEL
ALBUMIN: 4.2 g/dL (ref 3.5–5.0)
ALK PHOS: 47 U/L (ref 38–126)
ALT: 15 U/L (ref 14–54)
AST: 19 U/L (ref 15–41)
Anion gap: 10 (ref 5–15)
BUN: 55 mg/dL — AB (ref 6–20)
CALCIUM: 9.2 mg/dL (ref 8.9–10.3)
CHLORIDE: 98 mmol/L — AB (ref 101–111)
CO2: 24 mmol/L (ref 22–32)
CREATININE: 1.83 mg/dL — AB (ref 0.44–1.00)
GFR calc non Af Amer: 30 mL/min — ABNORMAL LOW (ref >60)
GFR, EST AFRICAN AMERICAN: 34 mL/min — AB (ref >60)
GLUCOSE: 402 mg/dL — AB (ref 65–99)
Potassium: 3.8 mmol/L (ref 3.5–5.1)
SODIUM: 132 mmol/L — AB (ref 135–145)
Total Bilirubin: 0.6 mg/dL (ref 0.3–1.2)
Total Protein: 7.7 g/dL (ref 6.5–8.1)

## 2015-09-29 LAB — CBC WITH DIFFERENTIAL/PLATELET
BASOS ABS: 0.1 10*3/uL (ref 0–0.1)
BASOS PCT: 1 %
EOS ABS: 0.1 10*3/uL (ref 0–0.7)
Eosinophils Relative: 1 %
HCT: 35.5 % (ref 35.0–47.0)
HEMOGLOBIN: 12.5 g/dL (ref 12.0–16.0)
Lymphocytes Relative: 8 %
Lymphs Abs: 0.9 10*3/uL — ABNORMAL LOW (ref 1.0–3.6)
MCH: 31.6 pg (ref 26.0–34.0)
MCHC: 35.1 g/dL (ref 32.0–36.0)
MCV: 89.9 fL (ref 80.0–100.0)
MONOS PCT: 5 %
Monocytes Absolute: 0.6 10*3/uL (ref 0.2–0.9)
NEUTROS ABS: 9.5 10*3/uL — AB (ref 1.4–6.5)
NEUTROS PCT: 85 %
Platelets: 244 10*3/uL (ref 150–440)
RBC: 3.95 MIL/uL (ref 3.80–5.20)
RDW: 13.3 % (ref 11.5–14.5)
WBC: 11.2 10*3/uL — AB (ref 3.6–11.0)

## 2015-09-29 LAB — LIPASE, BLOOD: Lipase: 93 U/L — ABNORMAL HIGH (ref 11–51)

## 2015-09-29 MED ORDER — SODIUM CHLORIDE 0.9 % IV SOLN
Freq: Once | INTRAVENOUS | Status: AC
  Administered 2015-09-29: 11:00:00 via INTRAVENOUS

## 2015-09-29 MED ORDER — ONDANSETRON HCL 4 MG PO TABS: 4.0000 mg | ORAL_TABLET | Freq: Every day | ORAL | Status: DC | PRN

## 2015-09-29 NOTE — Discharge Instructions (Signed)
Abdominal Pain, Adult Many things can cause abdominal pain. Usually, abdominal pain is not caused by a disease and will improve without treatment. It can often be observed and treated at home. Your health care provider will do a physical exam and possibly order blood tests and X-rays to help determine the seriousness of your pain. However, in many cases, more time must pass before a clear cause of the pain can be found. Before that point, your health care provider may not know if you need more testing or further treatment. HOME CARE INSTRUCTIONS Monitor your abdominal pain for any changes. The following actions may help to alleviate any discomfort you are experiencing:  Only take over-the-counter or prescription medicines as directed by your health care provider.  Do not take laxatives unless directed to do so by your health care provider.  Try a clear liquid diet (broth, tea, or water) as directed by your health care provider. Slowly move to a bland diet as tolerated. SEEK MEDICAL CARE IF:  You have unexplained abdominal pain.  You have abdominal pain associated with nausea or diarrhea.  You have pain when you urinate or have a bowel movement.  You experience abdominal pain that wakes you in the night.  You have abdominal pain that is worsened or improved by eating food.  You have abdominal pain that is worsened with eating fatty foods.  You have a fever. SEEK IMMEDIATE MEDICAL CARE IF:  Your pain does not go away within 2 hours.  You keep throwing up (vomiting).  Your pain is felt only in portions of the abdomen, such as the right side or the left lower portion of the abdomen.  You pass bloody or black tarry stools. MAKE SURE YOU:  Understand these instructions.  Will watch your condition.  Will get help right away if you are not doing well or get worse.   This information is not intended to replace advice given to you by your health care provider. Make sure you discuss  any questions you have with your health care provider.   Document Released: 12/22/2004 Document Revised: 12/03/2014 Document Reviewed: 11/21/2012 Chronic Kidney Disease Chronic kidney disease happens when the kidneys are damaged over a long period. The kidneys are two organs that do many important jobs in the body. These jobs include:  Removing wastes and extra fluids from the blood.  Making hormones that help to keep the body healthy.  Making sure that the body has the right amount of fluids and chemicals. Chronic kidney disease may be caused by many things. The kidney damage occurs slowly. If too much damage occurs, the kidneys may stop working the way that they should. This is dangerous. Treatment can help to slow down the damage and keep it from getting worse. HOME CARE  Follow your diet as told by your doctor. You may need to limit the amount of salt (sodium) and protein that you eat each day.  Take medicines only as told by your doctor. Do not take any new medicines unless your doctor approves it.  Quit smoking if you smoke. Talk to your doctor about programs that may help you quit smoking.  Have your blood pressure checked regularly and keep track of the results.  Start or keep doing an exercise plan.  Get shots (immunizations) as told by your doctor.  Take vitamins and minerals as told by your doctor.  Keep all follow-up visits as told by your doctor. This is important. GET HELP RIGHT AWAY IF:  Your symptoms get worse.  You have new symptoms.  You have symptoms of end-stage kidney disease. These include:  Headaches.  Skin that is darker or lighter than normal.  Numbness in the hands or feet.  Easy bruising.  Frequent hiccups.  Stopping of menstrual periods in women.  You have a fever.  You are making very little pee (urine).  You have pain or bleeding when you pee.   This information is not intended to replace advice given to you by your health care  provider. Make sure you discuss any questions you have with your health care provider.   Document Released: 06/08/2009 Document Revised: 12/03/2014 Document Reviewed: 11/11/2011 Elsevier Interactive Patient Education 2016 [employer]  Nausea and Vomiting Nausea means you feel sick to your stomach. Throwing up (vomiting) is a reflex where stomach contents come out of your mouth. HOME CARE   Take medicine as told by your doctor.  Do not force yourself to eat. However, you do need to drink fluids.  If you feel like eating, eat a normal diet as told by your doctor.  Eat rice, wheat, potatoes, bread, lean meats, yogurt, fruits, and vegetables.  Avoid high-fat foods.  Drink enough fluids to keep your pee (urine) clear or pale yellow.  Ask your doctor how to replace body fluid losses (rehydrate). Signs of body fluid loss (dehydration) include:  Feeling very thirsty.  Dry lips and mouth.  Feeling dizzy.  Dark pee.  Peeing less than normal.  Feeling confused.  Fast breathing or heart rate. GET HELP RIGHT AWAY IF:   You have blood in your throw up.  You have black or bloody poop (stool).  You have a bad headache or stiff neck.  You feel confused.  You have bad belly (abdominal) pain.  You have chest pain or trouble breathing.  You do not pee at least once every 8 hours.  You have cold, clammy skin.  You keep throwing up after 24 to 48 hours.  You have a fever. MAKE SURE YOU:   Understand these instructions.  Will watch your condition.  Will get help right away if you are not doing well or get worse.   This information is not intended to replace advice given to you by your health care provider. Make sure you discuss any questions you have with your health care provider.   Document Released: 08/31/2007 Document Revised: 06/06/2011 Document Reviewed: 08/13/2010 Elsevier Interactive Patient Education 2016 [employer].  Chartered certified accountant Patient  Education [employer].

## 2015-09-29 NOTE — ED Notes (Signed)

## 2015-09-29 NOTE — ED Notes (Signed)
Pt presents to ED from home c/o n/v and 10/10 cramping bil LQ abdominal pain starting around 0300 this morning. Had good BM this morning, states she had fever last night. CBG 312 per EMS, pt taking Lantus. Pt given 8mcg fentanyl and 4mg  zofran IV PTA. Pt has no c/o pain on arrival after fentanyl.

## 2015-09-29 NOTE — ED Provider Notes (Addendum)
[facility] Emergency Department Provider Note        Time seen: ----------------------------------------- 8:58 AM on 09/29/2015 -----------------------------------------    I have reviewed the triage vital signs and the nursing notes.   HISTORY  Chief Complaint Abdominal Pain    HPI Londen Vogelgesang is a 55 y.o. female who presents the ER for abdominal cramping while she was having a bowel movement this morning. Patient states recently she had been constipated, had a large bowel movement this morning associated with abdominal cramping. She subsequently started vomiting. She states her blood sugars been very elevated recently, she's also had a fever. She denies other complaints at this time.   Past Medical History  Diagnosis Date  . Diabetes mellitus without complication ([place])   . Hypertension     There are no active problems to display for this patient.   History reviewed. No pertinent past surgical history.  Allergies Review of patient's allergies indicates no known allergies.  Social History Social History  Substance Use Topics  . Smoking status: Never Smoker   . Smokeless tobacco: None  . Alcohol Use: No    Review of Systems Constitutional: Negative for fever. Cardiovascular: Negative for chest pain. Respiratory: Negative for shortness of breath. Gastrointestinal: Positive for abdominal pain, vomiting and constipation Genitourinary: Negative for dysuria. Musculoskeletal: Negative for back pain. Skin: Negative for rash. Neurological: Negative for headaches, focal weakness or numbness.  10-point ROS otherwise negative.  ____________________________________________   PHYSICAL EXAM:  VITAL SIGNS: ED Triage Vitals  Enc Vitals Group     BP --      Pulse --      Resp --      Temp --      Temp src --      SpO2 --      Weight --      Height --      Head Cir --      Peak Flow --      Pain Score --      Pain Loc --    Pain Edu? --      Excl. in [place]? --    Constitutional: Alert and oriented. Well appearing and in no distress. Eyes: Conjunctivae are normal. PERRL. Normal extraocular movements. ENT   Head: Normocephalic and atraumatic.   Nose: No congestion/rhinnorhea.   Mouth/Throat: Mucous membranes are moist.   Neck: No stridor. Cardiovascular: Normal rate, regular rhythm. No murmurs, rubs, or gallops. Respiratory: Normal respiratory effort without tachypnea nor retractions. Breath sounds are clear and equal bilaterally. No wheezes/rales/rhonchi. Gastrointestinal: Soft and nontender. Normal bowel sounds Musculoskeletal: Nontender with normal range of motion in all extremities. No lower extremity tenderness nor edema. Neurologic:  Normal speech and language. No gross focal neurologic deficits are appreciated.  Skin:  Skin is warm, dry and intact. No rash noted. Psychiatric: Mood and affect are normal. Speech and behavior are normal.  ____________________________________________  ED COURSE:  Pertinent labs & imaging results that were available during my care of the patient were reviewed by me and considered in my medical decision making (see chart for details). Patient presents in no acute distress. She had received fentanyl and Zofran prior to arrival. She has no further complaints at this time. ____________________________________________    LABS (pertinent positives/negatives)  Labs Reviewed  CBC WITH DIFFERENTIAL/PLATELET - Abnormal; Notable for the following:    WBC 11.2 (*)    Neutro Abs 9.5 (*)    Lymphs Abs 0.9 (*)    All  other components within normal limits  COMPREHENSIVE METABOLIC PANEL - Abnormal; Notable for the following:    Sodium 132 (*)    Chloride 98 (*)    Glucose, Bld 402 (*)    BUN 55 (*)    Creatinine, Ser 1.83 (*)    GFR calc non Af Amer 30 (*)    GFR calc Af Amer 34 (*)    All other components within normal limits  LIPASE, BLOOD - Abnormal; Notable for  the following:    Lipase 93 (*)    All other components within normal limits  URINALYSIS COMPLETEWITH MICROSCOPIC ([facility] ONLY) - Abnormal; Notable for the following:    Color, Urine STRAW (*)    APPearance CLEAR (*)    Glucose, UA >500 (*)    Protein, ur 30 (*)    Bacteria, UA RARE (*)    Squamous Epithelial / LPF 0-5 (*)    All other components within normal limits  BASIC METABOLIC PANEL - Abnormal; Notable for the following:    Sodium 134 (*)    Glucose, Bld 321 (*)    BUN 50 (*)    Creatinine, Ser 1.68 (*)    Calcium 8.7 (*)    GFR calc non Af Amer 33 (*)    GFR calc Af Amer 38 (*)    All other components within normal limits    RADIOLOGY Images were viewed by me  Abdomen 2 view IMPRESSION: 1. No evidence of bowel obstruction or generalized adynamic ileus. Mild colonic stool burden. 2. Multiple pelvic left lower abdomen calcifications which may reflect a combination phleboliths calcified fibroids/fibroids. Consider a ureteral calculus on the left there consistent clinical symptoms. ____________________________________________  FINAL ASSESSMENT AND PLAN  Abdominal pain, vomiting, Mild renal insufficiency  Plan: Patient with labs and imaging as dictated above. Patient was given 2 L of saline and had a recheck of her basic metabolic panel which did show some improvement in her kidney function. She is showing signs of renal insufficiency which is likely due to poor glycemic control. I will advise close follow-up with her doctor for reevaluation.   Earleen Newport, MD   Note: This dictation was prepared with Dragon dictation. Any transcriptional errors that result from this process are unintentional   Earleen Newport, MD 09/29/15 [place], MD 09/29/15 1320

## 2016-12-26 IMAGING — CT CT HEAD W/O CM
1 series · 15 of 30 positions shown, 19 images · non-contrast
Comparison: None.

CLINICAL DATA: Chronic lower extremity weakness. Acute onset of
headache. Initial encounter.

EXAM:
CT HEAD WITHOUT CONTRAST
TECHNIQUE: Contiguous axial images were obtained from the base of the skull
through the vertex without intravenous contrast.

[Series 2: soft tissue · axial · 0.46mm/px · z∈[-140,-4]mm · 15 of 30 slices shown, 19 images]
[im 2/30  brain]
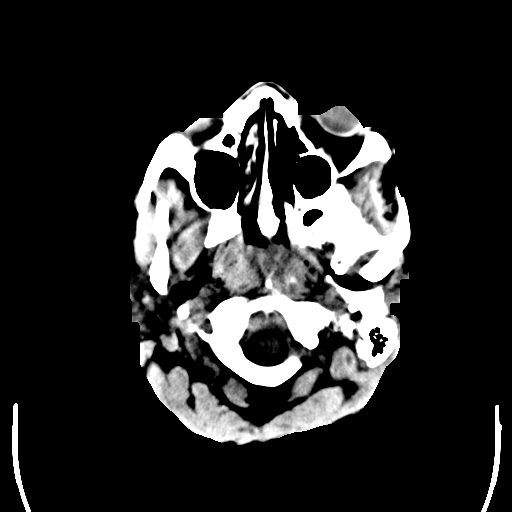
[im 2/30  bone]
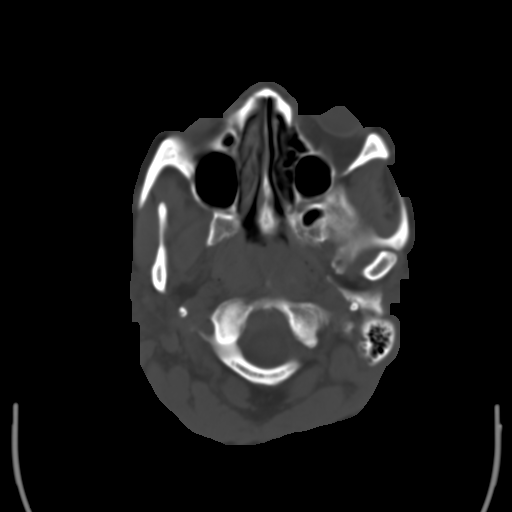
[im 4/30  brain]
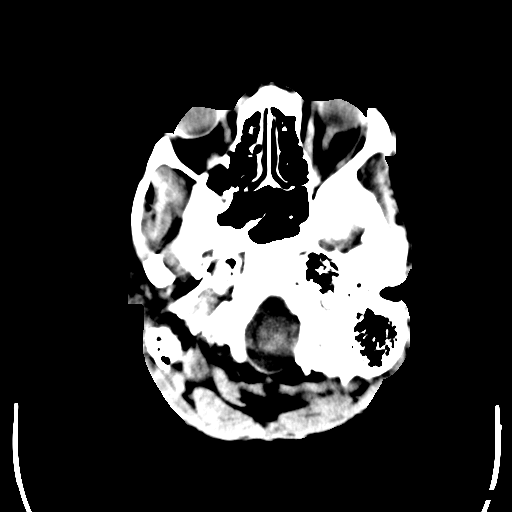
[im 6/30  brain]
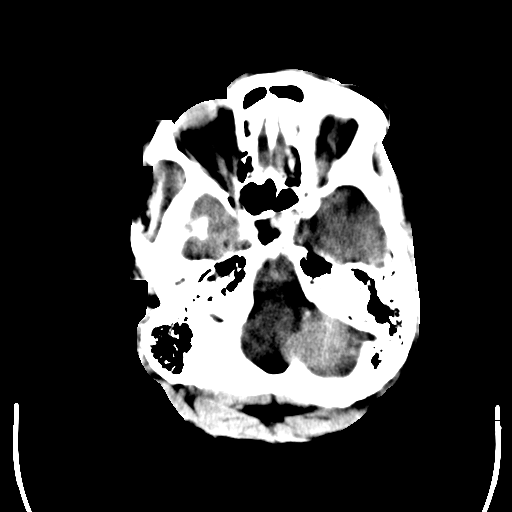
[im 8/30  brain]
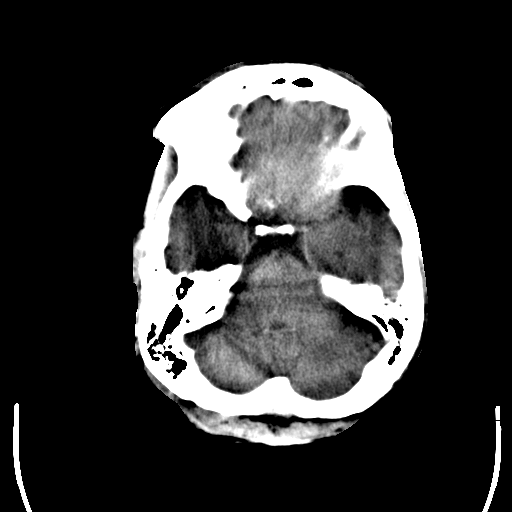
[im 10/30  brain]
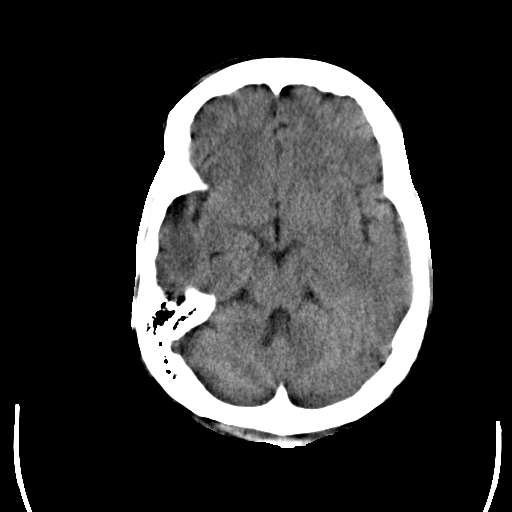
[im 10/30  bone]
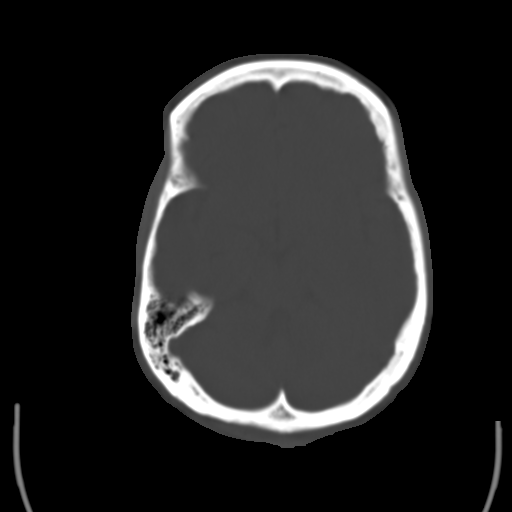
[im 12/30  brain]
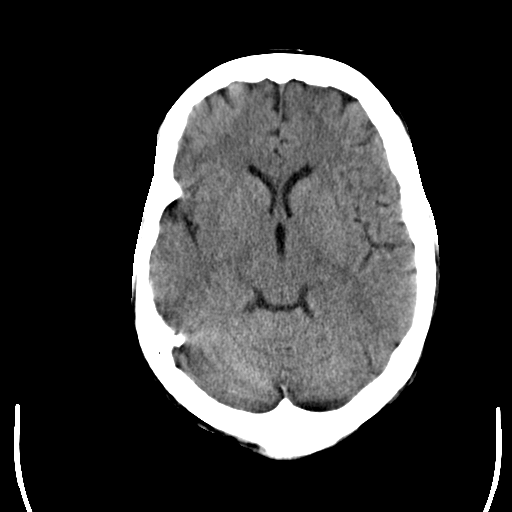
[im 14/30  brain]
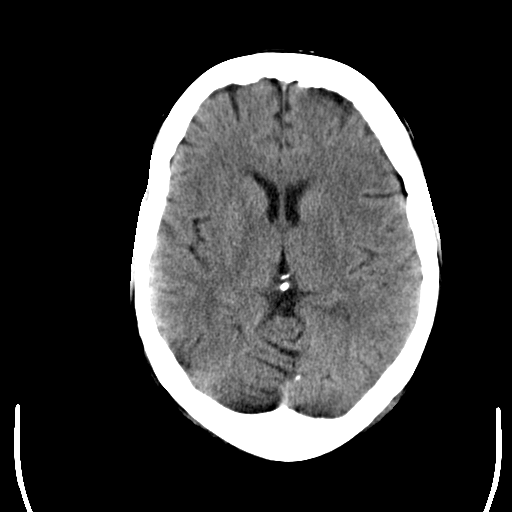
[im 16/30  brain]
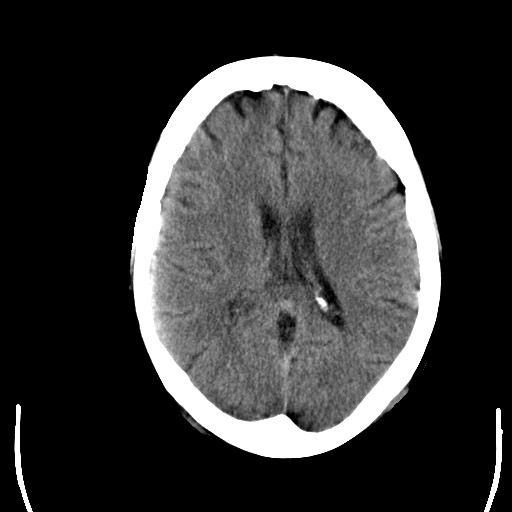
[im 17/30  brain]
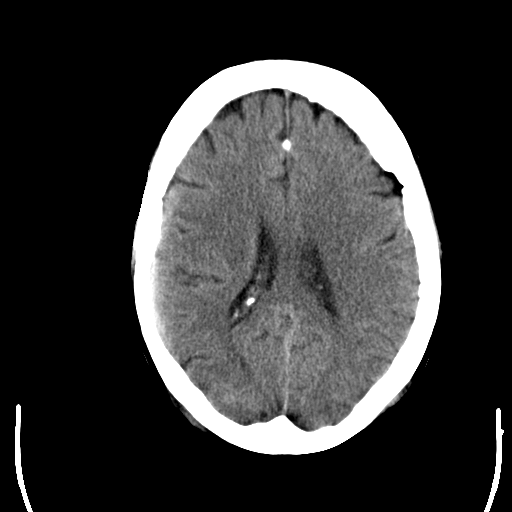
[im 17/30  bone]
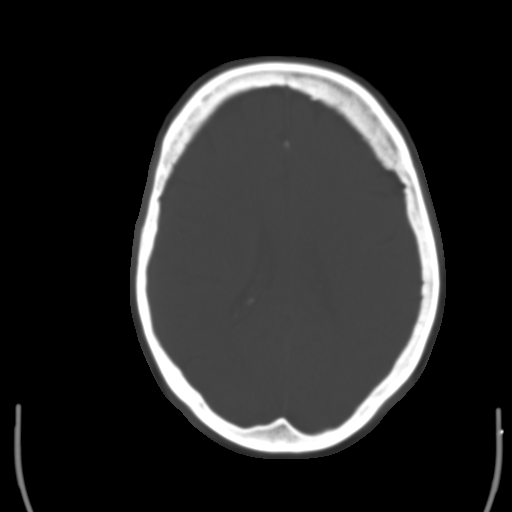
[im 19/30  brain]
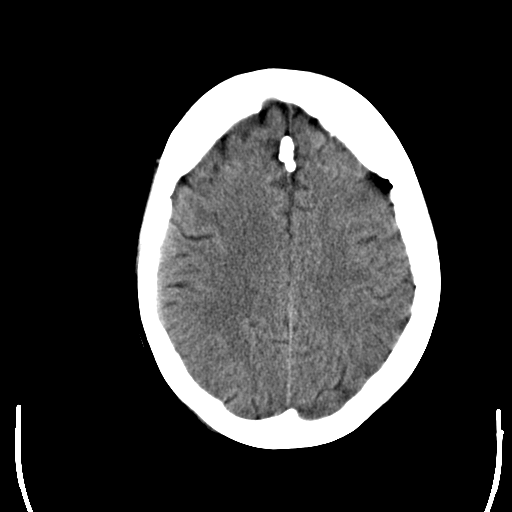
[im 21/30  brain]
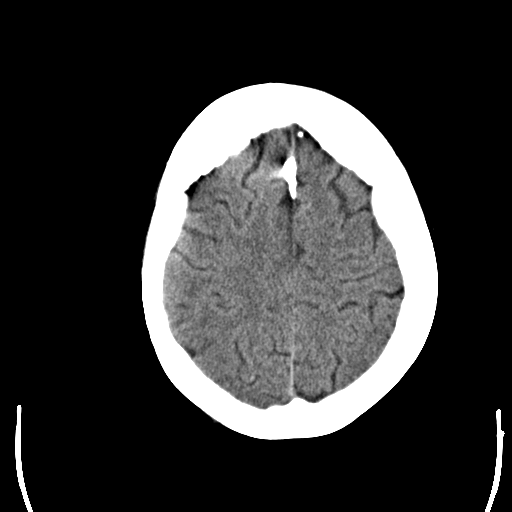
[im 23/30  brain]
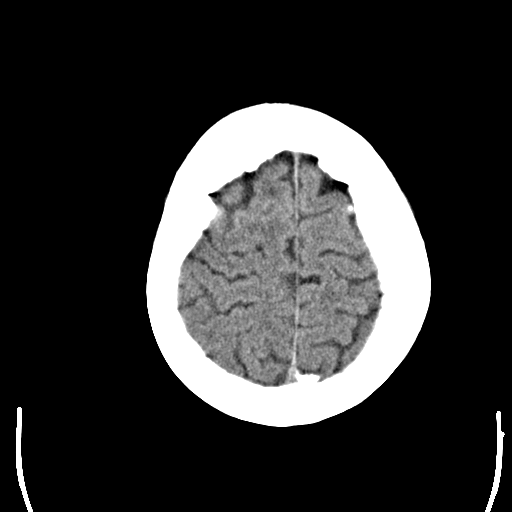
[im 25/30  brain]
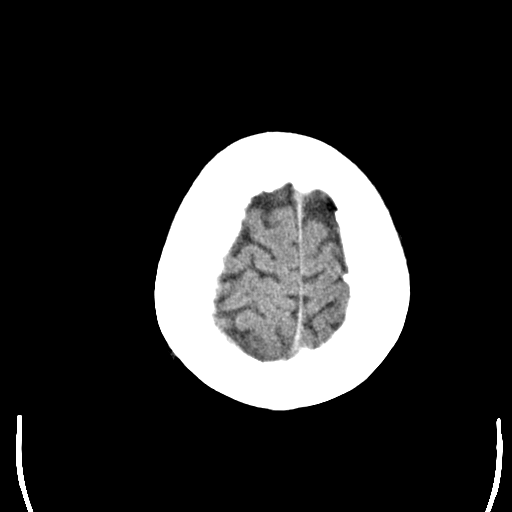
[im 25/30  bone]
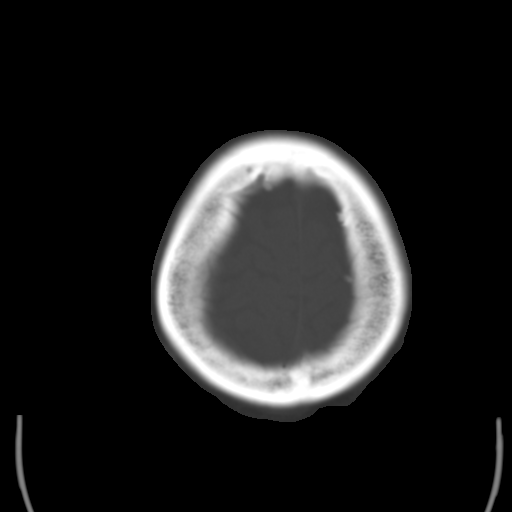
[im 27/30  brain]
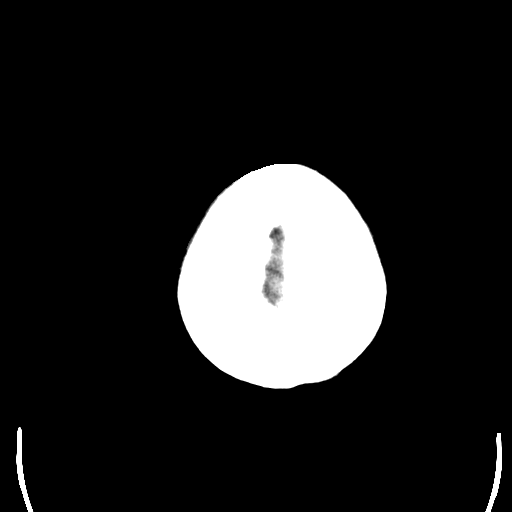
[im 29/30  brain]
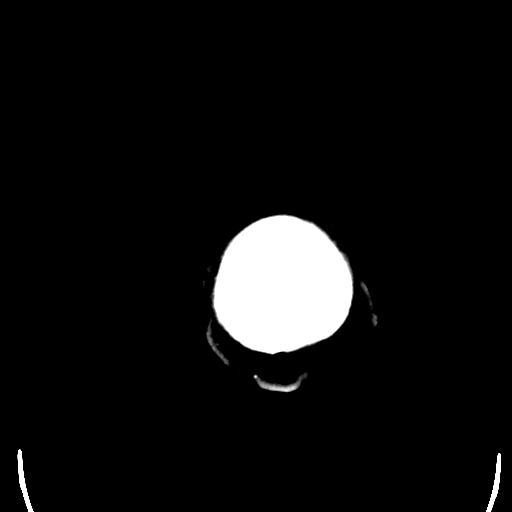

[15 of 30 positions shown; findings below may reference images not displayed]

FINDINGS: There is no evidence of acute infarction, mass lesion, or intra- or
extra-axial hemorrhage on CT.

Mild periventricular white matter change likely reflects small
vessel ischemic microangiopathy.

The posterior fossa, including the cerebellum, brainstem and fourth
ventricle, is within normal limits. The third and lateral
ventricles, and basal ganglia are unremarkable in appearance. The
cerebral hemispheres are symmetric in appearance, with normal
gray-white differentiation. No mass effect or midline shift is seen.

There is no evidence of fracture; visualized osseous structures are
unremarkable in appearance. The visualized portions of the orbits
are within normal limits. The paranasal sinuses and mastoid air
cells are well-aerated. No significant soft tissue abnormalities are
seen.
IMPRESSION: 1. No acute intracranial pathology seen on CT.
2. Mild small vessel ischemic microangiopathy.

## 2017-03-20 ENCOUNTER — Encounter: Payer: Self-pay | Admitting: Emergency Medicine

## 2017-03-20 ENCOUNTER — Other Ambulatory Visit: Payer: Self-pay

## 2017-03-20 ENCOUNTER — Emergency Department: Payer: Managed Care, Other (non HMO)

## 2017-03-20 ENCOUNTER — Observation Stay
Admission: EM | Admit: 2017-03-20 | Discharge: 2017-03-21 | Disposition: A | Discharge status: Home / Self Care | Payer: Managed Care, Other (non HMO) | Attending: Internal Medicine | Admitting: Internal Medicine

## 2017-03-20 DIAGNOSIS — Z794 Long term (current) use of insulin: Secondary | ICD-10-CM | POA: Diagnosis not present

## 2017-03-20 DIAGNOSIS — N183 Chronic kidney disease, stage 3 (moderate): Secondary | ICD-10-CM | POA: Diagnosis not present

## 2017-03-20 DIAGNOSIS — R799 Abnormal finding of blood chemistry, unspecified: Secondary | ICD-10-CM | POA: Insufficient documentation

## 2017-03-20 DIAGNOSIS — R079 Chest pain, unspecified: Principal | ICD-10-CM | POA: Diagnosis present

## 2017-03-20 DIAGNOSIS — Z79899 Other long term (current) drug therapy: Secondary | ICD-10-CM | POA: Insufficient documentation

## 2017-03-20 DIAGNOSIS — E1165 Type 2 diabetes mellitus with hyperglycemia: Secondary | ICD-10-CM | POA: Diagnosis not present

## 2017-03-20 DIAGNOSIS — I130 Hypertensive heart and chronic kidney disease with heart failure and stage 1 through stage 4 chronic kidney disease, or unspecified chronic kidney disease: Secondary | ICD-10-CM | POA: Diagnosis not present

## 2017-03-20 DIAGNOSIS — Z23 Encounter for immunization: Secondary | ICD-10-CM | POA: Insufficient documentation

## 2017-03-20 DIAGNOSIS — R748 Abnormal levels of other serum enzymes: Secondary | ICD-10-CM | POA: Diagnosis not present

## 2017-03-20 DIAGNOSIS — I214 Non-ST elevation (NSTEMI) myocardial infarction: Secondary | ICD-10-CM

## 2017-03-20 DIAGNOSIS — I509 Heart failure, unspecified: Secondary | ICD-10-CM | POA: Diagnosis not present

## 2017-03-20 DIAGNOSIS — E1122 Type 2 diabetes mellitus with diabetic chronic kidney disease: Secondary | ICD-10-CM | POA: Diagnosis not present

## 2017-03-20 LAB — CBC
HEMATOCRIT: 38.7 % (ref 35.0–47.0)
Hemoglobin: 13 g/dL (ref 12.0–16.0)
MCH: 31 pg (ref 26.0–34.0)
MCHC: 33.6 g/dL (ref 32.0–36.0)
MCV: 92.3 fL (ref 80.0–100.0)
Platelets: 281 10*3/uL (ref 150–440)
RBC: 4.19 MIL/uL (ref 3.80–5.20)
RDW: 13.6 % (ref 11.5–14.5)
WBC: 7.3 10*3/uL (ref 3.6–11.0)

## 2017-03-20 LAB — URINALYSIS, COMPLETE (UACMP) WITH MICROSCOPIC
Bacteria, UA: NONE SEEN
Bilirubin Urine: NEGATIVE
GLUCOSE, UA: 150 mg/dL — AB
HGB URINE DIPSTICK: NEGATIVE
Ketones, ur: NEGATIVE mg/dL
Leukocytes, UA: NEGATIVE
NITRITE: NEGATIVE
PROTEIN: NEGATIVE mg/dL
SPECIFIC GRAVITY, URINE: 1.008 (ref 1.005–1.030)
pH: 6 (ref 5.0–8.0)

## 2017-03-20 LAB — GLUCOSE, CAPILLARY
Glucose-Capillary: 321 mg/dL — ABNORMAL HIGH (ref 65–99)
Glucose-Capillary: 257 mg/dL — ABNORMAL HIGH (ref 65–99)
Glucose-Capillary: 281 mg/dL — ABNORMAL HIGH (ref 65–99)

## 2017-03-20 LAB — TROPONIN I
Troponin I: <0.03 ng/mL (ref <0.03)
Troponin I: 0.05 ng/mL (ref <0.03)
Troponin I: 0.05 ng/mL (ref <0.03)

## 2017-03-20 LAB — BASIC METABOLIC PANEL
ANION GAP: 8 (ref 5–15)
BUN: 44 mg/dL — AB (ref 6–20)
CO2: 25 mmol/L (ref 22–32)
Calcium: 8.9 mg/dL (ref 8.9–10.3)
Chloride: 98 mmol/L — ABNORMAL LOW (ref 101–111)
Creatinine, Ser: 1.65 mg/dL — ABNORMAL HIGH (ref 0.44–1.00)
GFR, EST AFRICAN AMERICAN: 39 mL/min — AB (ref >60)
GFR, EST NON AFRICAN AMERICAN: 33 mL/min — AB (ref >60)
Glucose, Bld: 368 mg/dL — ABNORMAL HIGH (ref 65–99)
POTASSIUM: 4.5 mmol/L (ref 3.5–5.1)
SODIUM: 131 mmol/L — AB (ref 135–145)

## 2017-03-20 MED ORDER — ZOLPIDEM TARTRATE 5 MG PO TABS
5.0000 mg | ORAL_TABLET | Freq: Every evening | ORAL | Status: DC | PRN
  Administered 2017-03-20: 5 mg via ORAL
  Filled 2017-03-20: qty 1

## 2017-03-20 MED ORDER — PNEUMOCOCCAL VAC POLYVALENT 25 MCG/0.5ML IJ INJ
0.5000 mL | INJECTION | INTRAMUSCULAR | Status: AC
  Administered 2017-03-21: 0.5 mL via INTRAMUSCULAR
  Filled 2017-03-20: qty 0.5

## 2017-03-20 MED ORDER — SODIUM CHLORIDE 0.9 % IV BOLUS (SEPSIS)
500.0000 mL | Freq: Once | INTRAVENOUS | Status: AC
  Administered 2017-03-20: 500 mL via INTRAVENOUS

## 2017-03-20 MED ORDER — HYDRALAZINE HCL 50 MG PO TABS
100.0000 mg | ORAL_TABLET | Freq: Three times a day (TID) | ORAL | Status: DC
  Administered 2017-03-20 – 2017-03-21 (×2): 100 mg via ORAL
  Filled 2017-03-20 (×2): qty 2

## 2017-03-20 MED ORDER — FUROSEMIDE 40 MG PO TABS
40.0000 mg | ORAL_TABLET | Freq: Every day | ORAL | Status: DC
  Administered 2017-03-21: 40 mg via ORAL
  Filled 2017-03-20: qty 1

## 2017-03-20 MED ORDER — ALPRAZOLAM 0.25 MG PO TABS: 0.2500 mg | ORAL_TABLET | Freq: Two times a day (BID) | ORAL | Status: DC | PRN

## 2017-03-20 MED ORDER — INSULIN ASPART 100 UNIT/ML ~~LOC~~ SOLN
0.0000 [IU] | Freq: Three times a day (TID) | SUBCUTANEOUS | Status: DC
  Administered 2017-03-21: 1 [IU] via SUBCUTANEOUS
  Administered 2017-03-21: 5 [IU] via SUBCUTANEOUS
  Filled 2017-03-20 (×2): qty 1

## 2017-03-20 MED ORDER — ONDANSETRON HCL 4 MG/2ML IJ SOLN: 4.0000 mg | Freq: Four times a day (QID) | INTRAMUSCULAR | Status: DC | PRN

## 2017-03-20 MED ORDER — INSULIN DETEMIR 100 UNIT/ML ~~LOC~~ SOLN
35.0000 [IU] | Freq: Two times a day (BID) | SUBCUTANEOUS | Status: DC
  Administered 2017-03-20 – 2017-03-21 (×2): 35 [IU] via SUBCUTANEOUS
  Filled 2017-03-20 (×3): qty 0.35

## 2017-03-20 MED ORDER — GI COCKTAIL ~~LOC~~
30.0000 mL | Freq: Once | ORAL | Status: AC
  Administered 2017-03-20: 30 mL via ORAL
  Filled 2017-03-20: qty 30

## 2017-03-20 MED ORDER — ASPIRIN 81 MG PO CHEW
324.0000 mg | CHEWABLE_TABLET | Freq: Once | ORAL | Status: AC
  Administered 2017-03-20: 324 mg via ORAL
  Filled 2017-03-20: qty 4

## 2017-03-20 MED ORDER — INSULIN ASPART 100 UNIT/ML ~~LOC~~ SOLN
0.0000 [IU] | Freq: Every day | SUBCUTANEOUS | Status: DC
  Administered 2017-03-20: 3 [IU] via SUBCUTANEOUS
  Filled 2017-03-20: qty 1

## 2017-03-20 MED ORDER — ISOSORBIDE MONONITRATE ER 60 MG PO TB24
60.0000 mg | ORAL_TABLET | Freq: Every day | ORAL | Status: DC
  Administered 2017-03-21: 60 mg via ORAL
  Filled 2017-03-20: qty 1

## 2017-03-20 MED ORDER — ENOXAPARIN SODIUM 40 MG/0.4ML ~~LOC~~ SOLN
40.0000 mg | SUBCUTANEOUS | Status: DC
  Administered 2017-03-20: 40 mg via SUBCUTANEOUS
  Filled 2017-03-20: qty 0.4

## 2017-03-20 MED ORDER — FAMOTIDINE IN NACL 20-0.9 MG/50ML-% IV SOLN
20.0000 mg | Freq: Once | INTRAVENOUS | Status: AC
  Administered 2017-03-20: 20 mg via INTRAVENOUS
  Filled 2017-03-20: qty 50

## 2017-03-20 MED ORDER — LISINOPRIL 20 MG PO TABS
20.0000 mg | ORAL_TABLET | Freq: Every day | ORAL | Status: DC
  Administered 2017-03-21: 20 mg via ORAL
  Filled 2017-03-20: qty 1

## 2017-03-20 MED ORDER — HYDROCHLOROTHIAZIDE 25 MG PO TABS
25.0000 mg | ORAL_TABLET | Freq: Every day | ORAL | Status: DC
  Administered 2017-03-21: 25 mg via ORAL
  Filled 2017-03-20: qty 1

## 2017-03-20 MED ORDER — NITROGLYCERIN 0.4 MG SL SUBL: 0.4000 mg | SUBLINGUAL_TABLET | SUBLINGUAL | Status: DC | PRN

## 2017-03-20 MED ORDER — ASPIRIN EC 325 MG PO TBEC
325.0000 mg | DELAYED_RELEASE_TABLET | Freq: Every day | ORAL | Status: DC
  Administered 2017-03-21: 325 mg via ORAL
  Filled 2017-03-20: qty 1

## 2017-03-20 MED ORDER — INFLUENZA VAC SPLIT QUAD 0.5 ML IM SUSY
0.5000 mL | PREFILLED_SYRINGE | INTRAMUSCULAR | Status: AC
  Administered 2017-03-21: 0.5 mL via INTRAMUSCULAR
  Filled 2017-03-20: qty 0.5

## 2017-03-20 MED ORDER — ATORVASTATIN CALCIUM 20 MG PO TABS: 40.0000 mg | ORAL_TABLET | Freq: Every day | ORAL | Status: DC

## 2017-03-20 MED ORDER — ONDANSETRON HCL 4 MG/2ML IJ SOLN
4.0000 mg | Freq: Once | INTRAMUSCULAR | Status: AC
  Administered 2017-03-20: 4 mg via INTRAVENOUS
  Filled 2017-03-20: qty 2

## 2017-03-20 MED ORDER — ACETAMINOPHEN 325 MG PO TABS: 650.0000 mg | ORAL_TABLET | Freq: Four times a day (QID) | ORAL | Status: DC | PRN

## 2017-03-20 MED ORDER — METOPROLOL TARTRATE 50 MG PO TABS
100.0000 mg | ORAL_TABLET | Freq: Two times a day (BID) | ORAL | Status: DC
  Administered 2017-03-20 – 2017-03-21 (×2): 100 mg via ORAL
  Filled 2017-03-20 (×2): qty 2

## 2017-03-20 MED ORDER — INSULIN ASPART 100 UNIT/ML ~~LOC~~ SOLN
3.0000 [IU] | Freq: Three times a day (TID) | SUBCUTANEOUS | Status: DC
  Administered 2017-03-21 (×2): 3 [IU] via SUBCUTANEOUS
  Filled 2017-03-20 (×2): qty 1

## 2017-03-20 NOTE — ED Triage Notes (Signed)
Pt presents to ED via [facility] for c/o chest pain and hyperglycemia. Per EMS pt c/o chest pain/indigestion feeling since 0300 today and high blood sugar. Pt states she is compliant with her diabetes medication. Pt states she has a feeling of indigestion midline and substernal at this time.

## 2017-03-20 NOTE — ED Notes (Signed)
Up to BR.

## 2017-03-20 NOTE — ED Notes (Signed)
Pt taken to xray at this time.

## 2017-03-20 NOTE — ED Notes (Signed)
Admitting MD at bedside at this time.

## 2017-03-20 NOTE — ED Provider Notes (Signed)
[facility] Emergency Department Provider Note  ____________________________________________  Time seen: Approximately 1:19 PM  I have reviewed the triage vital signs and the nursing notes.   HISTORY  Chief Complaint Chest Pain and Hyperglycemia   HPI Margaret Herrera is a 56 y.o. female with a history of diabetes, CHF, and hypertension who presents for evaluation of chest pain. Patient reports that since yesterday she has had heartburn. She describes the pain in the center of her chest, 1/10, burning quality, constant and nonradiating. She has had nausea but no vomiting. No dizziness, no shortness of breath, no diaphoresis. This morning her sugars were in the 400s. Patient reports that her baseline is usually in the low 200s. She denies any prior history of heart attacks, she is a former Economist, she denies ever undergoing a stress test or heart catheterization.No abdominal pain.   Past Medical History:  Diagnosis Date  . Diabetes mellitus without complication ([place])   . Hypertension     There are no active problems to display for this patient.   Past Surgical History:  Procedure Laterality Date  . ABDOMINAL HYSTERECTOMY     partial    Prior to Admission medications   Medication Sig Start Date End Date Taking? Authorizing Provider  furosemide (LASIX) 40 MG tablet Take 40 mg by mouth daily.    [provider]  hydrALAZINE (APRESOLINE) 100 MG tablet Take 100 mg by mouth 3 (three) times daily. 09/17/15   [provider]  hydrochlorothiazide (HYDRODIURIL) 25 MG tablet Take 25 mg by mouth daily.    [provider]  insulin glargine (LANTUS) 100 UNIT/ML injection Inject 40 Units into the skin daily.    [provider]  isosorbide mononitrate (IMDUR) 60 MG 24 hr tablet Take 60 mg by mouth daily.    [provider]  JANUVIA 100 MG tablet Take 100 mg by mouth daily. 09/11/15   [provider]  lisinopril  (PRINIVIL,ZESTRIL) 10 MG tablet Take 10 mg by mouth daily.    [provider]  metoprolol (LOPRESSOR) 100 MG tablet Take 100 mg by mouth 2 (two) times daily.    [provider]  ondansetron (ZOFRAN) 4 MG tablet Take 1 tablet (4 mg total) by mouth daily as needed for nausea or vomiting. 09/29/15   Earleen Newport, MD    Allergies Patient has no known allergies.  Family History Diabetes Alcoholism   Social History Social History   Tobacco Use  . Smoking status: Never Smoker  . Smokeless tobacco: Never Used  Substance Use Topics  . Alcohol use: No  . Drug use: No    Review of Systems  Constitutional: Negative for fever. Eyes: Negative for visual changes. ENT: Negative for sore throat. Neck: No neck pain  Cardiovascular: + chest pain. Respiratory: Negative for shortness of breath. Gastrointestinal: Negative for abdominal pain, vomiting or diarrhea. + nausea Genitourinary: Negative for dysuria. Musculoskeletal: Negative for back pain. Skin: Negative for rash. Neurological: Negative for headaches, weakness or numbness. Psych: No SI or HI  ____________________________________________   PHYSICAL EXAM:  VITAL SIGNS: ED Triage Vitals  Enc Vitals Group     BP 03/20/17 1215 123/70     Pulse Rate 03/20/17 1215 72     Resp 03/20/17 1215 20     Temp 03/20/17 1215 97.6 F (36.4 C)     Temp Source 03/20/17 1215 Oral     SpO2 03/20/17 1215 100 %     Weight 03/20/17 1215 150 lb (  68 kg)     Height 03/20/17 1215 5\' 2"  (1.575 m)     Head Circumference --      Peak Flow --      Pain Score 03/20/17 1214 1     Pain Loc --      Pain Edu? --      Excl. in [place]? --     Constitutional: Alert and oriented. Well appearing and in no apparent distress. HEENT:      Head: Normocephalic and atraumatic.         Eyes: Conjunctivae are normal. Sclera is non-icteric.       Mouth/Throat: Mucous membranes are moist.       Neck: Supple with no signs of  meningismus. Cardiovascular: Regular rate and rhythm. No murmurs, gallops, or rubs. 2+ symmetrical distal pulses are present in all extremities. No JVD. Respiratory: Normal respiratory effort. Lungs are clear to auscultation bilaterally. No wheezes, crackles, or rhonchi.  Gastrointestinal: Soft, non tender, and non distended with positive bowel sounds. No rebound or guarding. Musculoskeletal: Nontender with normal range of motion in all extremities. No edema, cyanosis, or erythema of extremities. Neurologic: Normal speech and language. Face is symmetric. Moving all extremities. No gross focal neurologic deficits are appreciated. Skin: Skin is warm, dry and intact. No rash noted. Psychiatric: Mood and affect are normal. Speech and behavior are normal.  ____________________________________________   LABS (all labs ordered are listed, but only abnormal results are displayed)  Labs Reviewed  BASIC METABOLIC PANEL - Abnormal; Notable for the following components:      Result Value   Sodium 131 (*)    Chloride 98 (*)    Glucose, Bld 368 (*)    BUN 44 (*)    Creatinine, Ser 1.65 (*)    GFR calc non Af Amer 33 (*)    GFR calc Af Amer 39 (*)    All other components within normal limits  URINALYSIS, COMPLETE (UACMP) WITH MICROSCOPIC - Abnormal; Notable for the following components:   Color, Urine STRAW (*)    APPearance CLEAR (*)    Glucose, UA 150 (*)    Squamous Epithelial / LPF 0-5 (*)    All other components within normal limits  GLUCOSE, CAPILLARY - Abnormal; Notable for the following components:   Glucose-Capillary 321 (*)    All other components within normal limits  TROPONIN I - Abnormal; Notable for the following components:   Troponin I 0.05 (*)    All other components within normal limits  GLUCOSE, CAPILLARY - Abnormal; Notable for the following components:   Glucose-Capillary 257 (*)    All other components within normal limits  CBC  TROPONIN I  CBG MONITORING, ED  CBG  MONITORING, ED   ____________________________________________  EKG  ED ECG REPORT I, Rudene Re, the attending physician, personally viewed and interpreted this ECG.  Normal sinus rhythm, rate of 73, first-degree AV block, normal QRS and QTc intervals, normal axis, no ST elevations or depressions, diffuse T-wave flattening.no significant changes when compared to prior from July 2017 ____________________________________________  RADIOLOGY  CXR: Coarsened lung markings in the bases, right greater than left, suggest bronchitic change.  ____________________________________________   PROCEDURES  Procedure(s) performed: None Procedures Critical Care performed:  Yes  CRITICAL CARE Performed by: Rudene Re  ?  Total critical care time: 30 min  Critical care time was exclusive of separately billable procedures and treating other patients.  Critical care was necessary to treat or prevent imminent or life-threatening deterioration.  Critical care was time spent personally by me on the following activities: development of treatment plan with patient and/or surrogate as well as nursing, discussions with consultants, evaluation of patient's response to treatment, examination of patient, obtaining history from patient or surrogate, ordering and performing treatments and interventions, ordering and review of laboratory studies, ordering and review of radiographic studies, pulse oximetry and re-evaluation of patient's condition.  ____________________________________________   INITIAL IMPRESSION / ASSESSMENT AND PLAN / ED COURSE  56 y.o. female with a history of diabetes, CHF, and hypertension who presents for evaluation of chest painthe patient describes as a burning sensation in the center of her chest associated with nausea. patient is well-appearing, in no distress, has normal vital signs, EKG shows no evidence of ischemia in this patient who is a diabetic and concerned for  possible ACS. We'll check cardiac enzymes 2. Will give aspirin, will also treat for possible reflux with Zofran, GI cocktail, and Pepcid. Patient is hyperglycemic but no evidence of DKA. We'll give gentle hydration.    ----------------------------------------- 1:10 PM on 03/20/2017 ----------------------------------------- OBSERVATION CARE: This patient is being placed under observation care for the following reasons: Chest pain with repeat testing to rule out ischemia  ----------------------------------------- 3:15 PM on 03/20/2017 ----------------------------------------- Patient remains pain free. Second troponin pending  ----------------------------------------- 5:15 PM on 03/20/2017 ----------------------------------------- END OF OBSERVATION STATUS: After an appropriate period of observation, this patient is being admitted due to the following reason(s):  2nd troponin elevated at 0.05. Patient remains pain free. Will admit to the Hospitalist for further evaluation.     As part of my medical decision making, I reviewed the following data within the [place] notes reviewed and incorporated, Labs reviewed , EKG interpreted , Old EKG reviewed, Old chart reviewed, Radiograph reviewed , Discussed with admitting physician , Notes from prior ED visits and  Controlled Substance Database    Pertinent labs & imaging results that were available during my care of the patient were reviewed by me and considered in my medical decision making (see chart for details).    ____________________________________________   FINAL CLINICAL IMPRESSION(S) / ED DIAGNOSES  Final diagnoses:  Chest pain, unspecified type  NSTEMI (non-ST elevated myocardial infarction) ([place])      NEW MEDICATIONS STARTED DURING THIS VISIT:  ED Discharge Orders    None       Note:  This document was prepared using Dragon voice recognition software and may include unintentional  dictation errors.    Rudene Re, MD 03/20/17 [phone]

## 2017-03-20 NOTE — ED Notes (Signed)
This RN to bedside at this time. Pt resting in bed with husband at bedside. Pt states that she is willing to be admitted to hospital. MD aware.

## 2017-03-20 NOTE — H&P (Addendum)
[facility] [place] NAME: Margaret Herrera    MR#:  [id number]  DATE OF BIRTH:  [birth year]  DATE OF ADMISSION:  03/20/2017  PRIMARY CARE PHYSICIAN: [facility] [place]   REQUESTING/REFERRING PHYSICIAN: Rudene Re, MD  CHIEF COMPLAINT:   Chief Complaint  Patient presents with  . Chest Pain  . Hyperglycemia   Chest pain today. HISTORY OF PRESENT ILLNESS:  Margaret Herrera  is a 56 y.o. female with a known history of hypertension and diabetes.  The patient presented the ED with a chest pain which is a burning sensation without radiation.  She had a nausea but no vomiting, diarrhea, diaphoresis or leg edema.  She denies any fever or chills.  She was found to have elevated troponin at 0.05 and a high blood sugar in the 400s.  The patient said she is taking Lantus 30 units twice a day, but the blood sugar at home is still not controlled at more than 200.  She was given aspirin 324 mg in the ED.  PAST MEDICAL HISTORY:   Past Medical History:  Diagnosis Date  . Diabetes mellitus without complication ([place])   . Hypertension     PAST SURGICAL HISTORY:   Past Surgical History:  Procedure Laterality Date  . ABDOMINAL HYSTERECTOMY     partial    SOCIAL HISTORY:   Social History   Tobacco Use  . Smoking status: Never Smoker  . Smokeless tobacco: Never Used  Substance Use Topics  . Alcohol use: No    FAMILY HISTORY:   Family History  Problem Relation Age of Onset  . Diabetes Mother     DRUG ALLERGIES:  No Known Allergies  REVIEW OF SYSTEMS:   Review of Systems  Constitutional: Negative for chills, fever and malaise/fatigue.  HENT: Negative for sore throat.   Eyes: Negative for blurred vision and double vision.  Respiratory: Negative for cough, hemoptysis, shortness of breath, wheezing and stridor.   Cardiovascular: Positive for chest pain. Negative for palpitations, orthopnea and leg swelling.    Gastrointestinal: Negative for abdominal pain, blood in stool, diarrhea, melena, nausea and vomiting.  Genitourinary: Negative for dysuria, flank pain and hematuria.  Musculoskeletal: Negative for back pain and joint pain.  Skin: Negative for rash.  Neurological: Negative for dizziness, sensory change, focal weakness, seizures, loss of consciousness, weakness and headaches.  Endo/Heme/Allergies: Negative for polydipsia.  Psychiatric/Behavioral: Negative for depression. The patient is not nervous/anxious.     MEDICATIONS AT HOME:   Prior to Admission medications   Medication Sig Start Date End Date Taking? Authorizing Provider  furosemide (LASIX) 40 MG tablet Take 40 mg by mouth daily.   Yes [provider]  hydrALAZINE (APRESOLINE) 100 MG tablet Take 100 mg by mouth 3 (three) times daily. 09/17/15  Yes [provider]  hydrochlorothiazide (HYDRODIURIL) 25 MG tablet Take 25 mg by mouth daily.   Yes [provider]  insulin detemir (LEVEMIR) 100 UNIT/ML injection Inject 30 Units into the skin at bedtime.   Yes [provider]  isosorbide mononitrate (IMDUR) 60 MG 24 hr tablet Take 60 mg by mouth daily.   Yes [provider]  JANUVIA 100 MG tablet Take 100 mg by mouth daily. 09/11/15  Yes [provider]  lisinopril (PRINIVIL,ZESTRIL) 20 MG tablet Take 20 mg by mouth daily.    Yes [provider]  metoprolol (LOPRESSOR) 100 MG tablet Take 100 mg by mouth 2 (two) times daily.  Yes [provider]      VITAL SIGNS:  Blood pressure (!) 137/55, pulse 71, temperature 97.6 F (36.4 C), temperature source Oral, resp. rate 13, height 5\' 2"  (1.575 m), weight 150 lb (68 kg), SpO2 99 %.  PHYSICAL EXAMINATION:  Physical Exam  GENERAL:  56 y.o.-year-old patient lying in the bed with no acute distress.  EYES: Pupils equal, round, reactive to light and accommodation. No scleral icterus. Extraocular muscles intact.  HEENT: Head  atraumatic, normocephalic. Oropharynx and nasopharynx clear.  NECK:  Supple, no jugular venous distention. No thyroid enlargement, no tenderness.  LUNGS: Normal breath sounds bilaterally, no wheezing, rales,rhonchi or crepitation. No use of accessory muscles of respiration.  CARDIOVASCULAR: S1, S2 normal. No murmurs, rubs, or gallops.  ABDOMEN: Soft, nontender, nondistended. Bowel sounds present. No organomegaly or mass.  EXTREMITIES: No pedal edema, cyanosis, or clubbing.  NEUROLOGIC: Cranial nerves II through XII are intact. Muscle strength 5/5 in all extremities. Sensation intact. Gait not checked.  PSYCHIATRIC: The patient is alert and oriented x 3.  SKIN: No obvious rash, lesion, or ulcer.   LABORATORY PANEL:   CBC Recent Labs  Lab 03/20/17 1216  WBC 7.3  HGB 13.0  HCT 38.7  PLT 281   ------------------------------------------------------------------------------------------------------------------  Chemistries  Recent Labs  Lab 03/20/17 1216  NA 131*  K 4.5  CL 98*  CO2 25  GLUCOSE 368*  BUN 44*  CREATININE 1.65*  CALCIUM 8.9   ------------------------------------------------------------------------------------------------------------------  Cardiac Enzymes Recent Labs  Lab 03/20/17 1558  TROPONINI 0.05*   ------------------------------------------------------------------------------------------------------------------  RADIOLOGY:  Dg Chest 2 View  Result Date: 03/20/2017 CLINICAL DATA:  Mid chest pain. EXAM: CHEST  2 VIEW COMPARISON:  March 22, 2015 FINDINGS: The heart, hila, and mediastinum are normal. No pneumothorax. Coarsened markings in the bases without focal infiltrate. No other acute abnormalities identified. IMPRESSION: Coarsened lung markings in the bases, right greater than left, suggest bronchitic change. Electronically Signed   By: Dorise Bullion III M.D   On: 03/20/2017 12:42      IMPRESSION AND PLAN:   Chest pain with elevated  troponin, rule out ACS. The patient will be placed alteration.  Continue aspirin 325 mg p.o. daily, Lipitor, follow-up troponin and cardiology consult.  Hyperglycemia with diabetes 2.  Start sliding scale and continue home Lantus, but increase to 25 unit twice daily.  Pseudohyponatremia due to hyperglycemia.  Follow-up BMP.  CKD stage III.  Stable.  Essential hypertension.  Continue home hypertension medication.  All the records are reviewed and case discussed with ED provider. Management plans discussed with the patient, her husband and they are in agreement.  CODE STATUS: Full code  TOTAL TIME TAKING CARE OF THIS PATIENT: 47 minutes.    Demetrios Loll M.D on 03/20/2017 at 6:52 PM  Between 7am to 6pm - Pager - [phone]  After 6pm go to [email or web address] - [employer] [facility]  Office  [phone]  CC: Primary care physician; [employer]   Note: This dictation was prepared with Diplomatic Services operational officer dictation along with smaller Company secretary. Any transcriptional errors that result from this process are unin

## 2017-03-20 NOTE — ED Notes (Signed)
Pt states took 50units of Levemir U-100 prior to coming to ED.

## 2017-03-20 NOTE — ED Notes (Signed)
Megan RN, aware of bed assigned  

## 2017-03-21 LAB — LIPID PANEL
Cholesterol: 186 mg/dL (ref 0–200)
HDL: 45 mg/dL
LDL Cholesterol: 105 mg/dL — ABNORMAL HIGH (ref 0–99)
Total CHOL/HDL Ratio: 4.1 ratio
Triglycerides: 179 mg/dL — ABNORMAL HIGH
VLDL: 36 mg/dL (ref 0–40)

## 2017-03-21 LAB — HEMOGLOBIN A1C
HEMOGLOBIN A1C: 10.1 % — AB (ref 4.8–5.6)
Mean Plasma Glucose: 243.17 mg/dL

## 2017-03-21 LAB — GLUCOSE, CAPILLARY
GLUCOSE-CAPILLARY: 138 mg/dL — AB (ref 65–99)
Glucose-Capillary: 251 mg/dL — ABNORMAL HIGH (ref 65–99)

## 2017-03-21 MED ORDER — INSULIN DETEMIR 100 UNIT/ML ~~LOC~~ SOLN
35.0000 [IU] | Freq: Every day | SUBCUTANEOUS | 11 refills | Status: DC
Start: 2017-03-21 — End: 2019-05-26

## 2017-03-21 NOTE — Discharge Instructions (Signed)
[facility] - [place] [facility] ° °DIET:  °Diabetic diet, cardiac diet ° °DISCHARGE CONDITION:  °Stable ° °ACTIVITY:  °Activity as tolerated ° °OXYGEN:  °Home Oxygen: No. °  °Oxygen Delivery: room air ° °DISCHARGE LOCATION:  °home  ° ° °ADDITIONAL DISCHARGE INSTRUCTION: ° ° °If you experience worsening of your admission symptoms, develop shortness of breath, life threatening emergency, suicidal or homicidal thoughts you must seek medical attention immediately by calling 911 or calling your MD immediately  if symptoms less severe. ° °You Must read complete instructions/literature along with all the possible adverse reactions/side effects for all the Medicines you take and that have been prescribed to you. Take any new Medicines after you have completely understood and accpet all the possible adverse reactions/side effects.  ° °Please note ° °You were cared for by a hospitalist during your hospital stay. If you have any questions about your discharge medications or the care you received while you were in the hospital after you are discharged, you can call the unit and asked to speak with the hospitalist on call if the hospitalist that took care of you is not available. Once you are discharged, your primary care physician will handle any further medical issues. Please note that NO REFILLS for any discharge medications will be authorized once you are discharged, as it is imperative that you return to your primary care physician (or establish a relationship with a primary care physician if you do not have one) for your aftercare needs so that they can reassess your need for medications and monitor your lab values. ° ° °

## 2017-03-21 NOTE — Discharge Summary (Signed)
[place] at [facility], 56 y.o., DOB 06/09/1960, MRN [id number]. Admission date: 03/20/2017 Discharge Date 03/21/2017 Primary [place], [place] Admitting Physician Demetrios Loll, MD  Admission Diagnosis  NSTEMI (non-ST elevated myocardial infarction) [facility]) [I21.4] Chest pain, unspecified type [R07.9]  Discharge Diagnosis   Active Problems:   Chest pain   dm2 with poor control   Pseudohyponatremia   CKD stage 3   [place] Hypertetion        Hospital Course  Margaret Herrera  is a 56 y.o. female with a known history of hypertension and diabetes.  The patient presented the ED with a chest pain which is a burning sensation without radiation.  Patient's troponin was 0.05.  It remained that way throughout the night.  She has no further pains.  She was seen in consultation by cardiology who recommended that she can follow-up outpatient and have a stress test.             Consults  cardiology  Significant Tests:  See full reports for all details    Dg Chest 2 View  Result Date: 03/20/2017 CLINICAL DATA:  Mid chest pain. EXAM: CHEST  2 VIEW COMPARISON:  March 22, 2015 FINDINGS: The heart, hila, and mediastinum are normal. No pneumothorax. Coarsened markings in the bases without focal infiltrate. No other acute abnormalities identified. IMPRESSION: Coarsened lung markings in the bases, right greater than left, suggest bronchitic change. Electronically Signed   By: Dorise Bullion III M.D   On: 03/20/2017 12:42       Today   Subjective:   Margaret Herrera feeling better denies any chest pain o Objective:   Blood pressure (!) 155/73, pulse 63, temperature 98.4 F (36.9 C), temperature source Oral, resp. rate 18, height 5\' 2"  (1.575 m), weight 150 lb (68 kg), SpO2 99 %.  .  Intake/Output Summary (Last 24 hours) at 03/21/2017 1439 Last data filed at 03/21/2017 0947 Gross per 24 hour  Intake 240 ml  Output -   Net 240 ml    Exam VITAL SIGNS: Blood pressure (!) 155/73, pulse 63, temperature 98.4 F (36.9 C), temperature source Oral, resp. rate 18, height 5\' 2"  (1.575 m), weight 150 lb (68 kg), SpO2 99 %.  GENERAL:  56 y.o.-year-old patient lying in the bed with no acute distress.  EYES: Pupils equal, round, reactive to light and accommodation. No scleral icterus. Extraocular muscles intact.  HEENT: Head atraumatic, normocephalic. Oropharynx and nasopharynx clear.  NECK:  Supple, no jugular venous distention. No thyroid enlargement, no tenderness.  LUNGS: Normal breath sounds bilaterally, no wheezing, rales,rhonchi or crepitation. No use of accessory muscles of respiration.  CARDIOVASCULAR: S1, S2 normal. No murmurs, rubs, or gallops.  ABDOMEN: Soft, nontender, nondistended. Bowel sounds present. No organomegaly or mass.  EXTREMITIES: No pedal edema, cyanosis, or clubbing.  NEUROLOGIC: Cranial nerves II through XII are intact. Muscle strength 5/5 in all extremities. Sensation intact. Gait not checked.  PSYCHIATRIC: The patient is alert and oriented x 3.  SKIN: No obvious rash, lesion, or ulcer.   Data Review     CBC w Diff:  Lab Results  Component Value Date   WBC 7.3 03/20/2017   HGB 13.0 03/20/2017   HGB 15.1 04/30/2014   HCT 38.7 03/20/2017   HCT 45.5 04/30/2014   PLT 281 03/20/2017   PLT 230 04/30/2014   LYMPHOPCT 8 09/29/2015   LYMPHOPCT 7.4 04/30/2014   MONOPCT 5 09/29/2015   MONOPCT 3.9 04/30/2014  EOSPCT 1 09/29/2015   EOSPCT 0.0 04/30/2014   BASOPCT 1 09/29/2015   BASOPCT 0.5 04/30/2014   CMP:  Lab Results  Component Value Date   NA 131 (L) 03/20/2017   NA 134 (L) 04/30/2014   K 4.5 03/20/2017   K 3.1 (L) 04/30/2014   CL 98 (L) 03/20/2017   CL 96 (L) 04/30/2014   CO2 25 03/20/2017   CO2 26 04/30/2014   BUN 44 (H) 03/20/2017   BUN 11 04/30/2014   CREATININE 1.65 (H) 03/20/2017   CREATININE 1.18 04/30/2014   PROT 7.7 09/29/2015   PROT 7.9 04/30/2014    ALBUMIN 4.2 09/29/2015   ALBUMIN 3.8 04/30/2014   BILITOT 0.6 09/29/2015   BILITOT 0.5 04/30/2014   ALKPHOS 47 09/29/2015   ALKPHOS 61 04/30/2014   AST 19 09/29/2015   AST 21 04/30/2014   ALT 15 09/29/2015   ALT 22 04/30/2014  .  Micro Results No results found for this or any previous visit (from the past 240 hour(s)).      Code Status Orders  (From admission, onward)        Start     Ordered   03/20/17 1948  Full code  Continuous     03/20/17 1947    Code Status History    Date Active Date Inactive Code Status Order ID Comments User Context   This patient has a current code status but no historical code status.          [place] Follow up in 1 week(s).   Contact information: [place] [id number] [phone]        Teodoro Spray, MD Follow up in 1 week(s).   Specialty:  Cardiology Why:  hospital f/u chest pain Contact information: [address] [place] [id number] [phone]           Discharge Medications   Allergies as of 03/21/2017   No Known Allergies     Medication List    TAKE these medications   furosemide 40 MG tablet Commonly known as:  LASIX Take 40 mg by mouth daily.   hydrALAZINE 100 MG tablet Commonly known as:  APRESOLINE Take 100 mg by mouth 3 (three) times daily.   hydrochlorothiazide 25 MG tablet Commonly known as:  HYDRODIURIL Take 25 mg by mouth daily.   insulin detemir 100 UNIT/ML injection Commonly known as:  LEVEMIR Inject 0.35 mLs (35 Units total) into the skin at bedtime. What changed:  how much to take   isosorbide mononitrate 60 MG 24 hr tablet Commonly known as:  IMDUR Take 60 mg by mouth daily.   JANUVIA 100 MG tablet Generic drug:  sitaGLIPtin Take 100 mg by mouth daily.   lisinopril 20 MG tablet Commonly known as:  PRINIVIL,ZESTRIL Take 20 mg by mouth daily.   metoprolol tartrate  100 MG tablet Commonly known as:  LOPRESSOR Take 100 mg by mouth 2 (two) times daily.          Total Time in preparing paper work, data evaluation and todays exam - 35 minutes  Dustin Flock M.D on 03/21/2017 at 2:39 PM  [facility]   Office  [phone]

## 2017-03-21 NOTE — Progress Notes (Signed)
Pt tobe discharged today. pneumo and flu vaccines given. Iv and tele removed. disch instructions given to pt and spouse. disch via w.c. To hoem.

## 2017-03-21 NOTE — Consult Note (Signed)
[place]  CARDIOLOGY CONSULT NOTE  Patient IDAnaaya Herrera MRN: [id number] DOB/AGE: Oct 13, 1960 56 y.o.  Admit date: 03/20/2017 Referring Physician Dr. Serita Grit Primary Physician   Primary Cardiologist   Reason for Consultation chest pain  HPI: Patient is a 56 year old female with history of diabetes, hypertension with a history in 2015 hypertensive crisis and an EF of 20-25% however subsequent workup at [facility] soon after that revealed preserved LV function.  She does not appear to have had an invasive evaluation at that time.  She has done fairly well although was admitted yesterday with an episode of midsternal chest pain.  Her serum troponin was 0.05.  Her LDL is 105.  Her total cholesterol is 186.  Her HDL is 45.  Her hemoglobin was normal.  She has had no further chest pain.  Her electrocardiogram revealed normal sinus rhythm with no ischemic changes.  She has had no further chest pain.  She denies shortness of breath.  She is compliant with her medications as an outpatient which includes furosemide 40 mg daily, hydralazine 100 mg 3 times daily, hydrochlorothiazide 20 film milligrams daily, isosorbide 60 mg daily, lisinopril 20 mg daily, metoprolol 100 mg twice daily, and aspirin.  She is doing well with no further chest pain.  She denies orthopnea PND shortness of breath.  Review of Systems  Constitutional: Negative.   HENT: Negative.   Eyes: Negative.   Respiratory: Negative.   Cardiovascular: Positive for chest pain.  Gastrointestinal: Negative.   Genitourinary: Negative.   Musculoskeletal: Negative.   Skin: Negative.   Neurological: Negative.   Endo/Heme/Allergies: Negative.   Psychiatric/Behavioral: Negative.     Past Medical History:  Diagnosis Date  . Diabetes mellitus without complication ([place])   . Hypertension     Family History  Problem Relation Age of Onset  . Diabetes Mother     Social History    Socioeconomic History  . Marital status: Single    Spouse name: Not on file  . Number of children: Not on file  . Years of education: Not on file  . Highest education level: Not on file  Social Needs  . Financial resource strain: Not on file  . Food insecurity - worry: Not on file  . Food insecurity - inability: Not on file  . Transportation needs - medical: Not on file  . Transportation needs - non-medical: Not on file  Occupational History  . Not on file  Tobacco Use  . Smoking status: Never Smoker  . Smokeless tobacco: Never Used  Substance and Sexual Activity  . Alcohol use: No  . Drug use: No  . Sexual activity: Not on file  Other Topics Concern  . Not on file  Social History Narrative  . Not on file    Past Surgical History:  Procedure Laterality Date  . ABDOMINAL HYSTERECTOMY     partial     Medications Prior to Admission  Medication Sig Dispense Refill Last Dose  . furosemide (LASIX) 40 MG tablet Take 40 mg by mouth daily.   03/20/2017 at AM  . hydrALAZINE (APRESOLINE) 100 MG tablet Take 100 mg by mouth 3 (three) times daily.   03/20/2017 at AM  . hydrochlorothiazide (HYDRODIURIL) 25 MG tablet Take 25 mg by mouth daily.   03/20/2017 at AM  . isosorbide mononitrate (IMDUR) 60 MG 24 hr tablet Take 60 mg by mouth daily.   03/20/2017 at AM  . JANUVIA 100  MG tablet Take 100 mg by mouth daily.   03/20/2017 at AM  . lisinopril (PRINIVIL,ZESTRIL) 20 MG tablet Take 20 mg by mouth daily.    03/20/2017 at AM  . metoprolol (LOPRESSOR) 100 MG tablet Take 100 mg by mouth 2 (two) times daily.   03/20/2017 at AM    Physical Exam: Blood pressure (!) 155/73, pulse 63, temperature 98.4 F (36.9 C), temperature source Oral, resp. rate 18, height 5\' 2"  (1.575 m), weight 68 kg (150 lb), SpO2 99 %.   Wt Readings from Last 1 Encounters:  03/20/17 68 kg (150 lb)     General appearance: alert and cooperative Resp: clear to auscultation bilaterally Cardio: regular rate and  rhythm GI: soft, non-tender; bowel sounds normal; no masses,  no organomegaly Extremities: extremities normal, atraumatic, no cyanosis or edema Neurologic: Grossly normal  Labs:   Lab Results  Component Value Date   WBC 7.3 03/20/2017   HGB 13.0 03/20/2017   HCT 38.7 03/20/2017   MCV 92.3 03/20/2017   PLT 281 03/20/2017    Recent Labs  Lab 03/20/17 1216  NA 131*  K 4.5  CL 98*  CO2 25  BUN 44*  CREATININE 1.65*  CALCIUM 8.9  GLUCOSE 368*   Lab Results  Component Value Date   CKTOTAL 98 08/05/2013   CKMB 0.7 08/05/2013   TROPONINI 0.05 ([place]) 03/20/2017      Radiology: Acute cardiopulmonary disease other than possible bronchitic changes. EKG: Normal sinus rhythm with no ischemia.  ASSESSMENT AND PLAN: 56 year old female with history of diabetes and hypertension on aggressive antihypertensive management who was admitted with chest pain.  She had a mild troponin elevation 0.05.  She was somewhat hypertensive on presentation.  She also has a chronic renal insufficiency with a creatinine 1.65.  Her serum troponin remained at 0.05.  Electric cardiogram showed no ischemia.  She is asymptomatic at present.  She had a noninvasive workup at [facility] in 2015 which was unremarkable for ischemia.  She had a transient reduction in her LV function in 2015 during an admission with a hypertensive crisis however most recent echo done after that episode resolved showed preserved LV function.  She denies orthopnea or PND.  She denies syncope.  Would recommend ambulation on current regimen and if stable discharge to home.  We will see as an outpatient within 1 week and proceed with a noninvasive workup likely to include functional study with imaging. Signed: Teodoro Spray MD, [facility] 03/21/2017, 10:21 AM

## 2017-03-22 LAB — HIV ANTIBODY (ROUTINE TESTING W REFLEX): HIV Screen 4th Generation wRfx: NONREACTIVE

## 2017-04-04 ENCOUNTER — Other Ambulatory Visit: Payer: Self-pay | Admitting: Obstetrics and Gynecology

## 2017-04-04 DIAGNOSIS — Z1231 Encounter for screening mammogram for malignant neoplasm of breast: Secondary | ICD-10-CM

## 2018-01-03 ENCOUNTER — Encounter: Admit: 2018-01-03 | Discharge: 2018-01-05 | Payer: MEDICAID

## 2018-01-03 DIAGNOSIS — R0602 Shortness of breath: Principal | ICD-10-CM

## 2018-01-04 DIAGNOSIS — R0602 Shortness of breath: Principal | ICD-10-CM

## 2018-01-05 ENCOUNTER — Encounter: Admit: 2018-01-05 | Discharge: 2018-03-05 | Payer: MEDICAID

## 2018-01-05 MED ORDER — INSULIN GLARGINE (U-100) 100 UNIT/ML (3 ML) SUBCUTANEOUS PEN
Freq: Two times a day (BID) | SUBCUTANEOUS | 0 refills | 0 days | Status: SS
Start: 2018-01-05 — End: 2018-08-03

## 2018-01-05 MED ORDER — INSULIN DETEMIR (U-100) 100 UNIT/ML SUBCUTANEOUS SOLUTION
Freq: Two times a day (BID) | SUBCUTANEOUS | 0 refills | 0 days | Status: CP
Start: 2018-01-05 — End: 2018-01-05

## 2018-01-05 MED ORDER — LANCETS 30 GAUGE
Freq: Four times a day (QID) | 11 refills | 0 days | Status: CP
Start: 2018-01-05 — End: 2018-02-04
  Filled 2018-01-05: qty 100, 25d supply, fill #0

## 2018-01-05 MED ORDER — INSULIN ASPART U-100  100 UNIT/ML SUBCUTANEOUS SOLUTION
Freq: Three times a day (TID) | SUBCUTANEOUS | 0 refills | 0.00000 days | Status: CP
Start: 2018-01-05 — End: 2018-01-05
  Filled 2018-01-05: qty 15, 26d supply, fill #0

## 2018-01-05 MED ORDER — INSULIN ASPART (U-100) 100 UNIT/ML (3 ML) SUBCUTANEOUS PEN
Freq: Three times a day (TID) | SUBCUTANEOUS | 0 refills | 0.00000 days | Status: SS
Start: 2018-01-05 — End: 2018-08-03

## 2018-01-05 MED ORDER — BLOOD SUGAR DIAGNOSTIC STRIPS
ORAL_STRIP | Freq: Four times a day (QID) | 0 refills | 0 days | Status: CP
Start: 2018-01-05 — End: ?

## 2018-01-05 MED ORDER — INSULIN ASPART (U-100) 100 UNIT/ML (3 ML) SUBCUTANEOUS PEN: mL | Freq: Three times a day (TID) | 0 refills | 0 days | Status: SS

## 2018-01-05 MED ORDER — PEN NEEDLE, DIABETIC 31 GAUGE X 5/16" (8 MM)
11 refills | 0 days | Status: SS
Start: 2018-01-05 — End: 2018-08-03

## 2018-01-05 MED FILL — LANTUS SOLOSTAR U-100 INSULIN 100 UNIT/ML (3 ML) SUBCUTANEOUS PEN: 30 days supply | Qty: 30 | Fill #0 | Status: AC

## 2018-01-05 MED FILL — ON CALL EXPRESS TEST STRIP: 25 days supply | Qty: 100 | Fill #0 | Status: AC

## 2018-01-05 MED FILL — UNIFINE PENTIPS 31 GAUGE X 5/16" NEEDLE: 16 days supply | Qty: 100 | Fill #0 | Status: AC

## 2018-01-05 MED FILL — LANTUS SOLOSTAR U-100 INSULIN 100 UNIT/ML (3 ML) SUBCUTANEOUS PEN: SUBCUTANEOUS | 30 days supply | Qty: 30 | Fill #0

## 2018-01-05 MED FILL — UNIFINE PENTIPS 31 GAUGE X 5/16" NEEDLE: 16 days supply | Qty: 100 | Fill #0

## 2018-01-05 MED FILL — ON CALL EXPRESS TEST STRIP: 25 days supply | Qty: 100 | Fill #0

## 2018-01-05 MED FILL — NOVOLOG FLEXPEN U-100 INSULIN ASPART 100 UNIT/ML (3 ML) SUBCUTANEOUS: 26 days supply | Qty: 15 | Fill #0 | Status: AC

## 2018-01-05 MED FILL — ON CALL LANCET 30 GAUGE: 25 days supply | Qty: 100 | Fill #0 | Status: AC

## 2018-01-15 ENCOUNTER — Other Ambulatory Visit: Payer: Self-pay | Admitting: Family Medicine

## 2018-01-15 DIAGNOSIS — Z1231 Encounter for screening mammogram for malignant neoplasm of breast: Secondary | ICD-10-CM

## 2018-06-04 ENCOUNTER — Encounter: Admit: 2018-06-04 | Discharge: 2018-06-05 | Disposition: A | Discharge status: Home / Self Care | Payer: MEDICAID

## 2018-06-04 DIAGNOSIS — R112 Nausea with vomiting, unspecified: Principal | ICD-10-CM

## 2018-06-04 DIAGNOSIS — Z87891 Personal history of nicotine dependence: Principal | ICD-10-CM

## 2018-06-04 DIAGNOSIS — Z90710 Acquired absence of both cervix and uterus: Principal | ICD-10-CM

## 2018-06-04 DIAGNOSIS — E785 Hyperlipidemia, unspecified: Principal | ICD-10-CM

## 2018-06-04 DIAGNOSIS — E119 Type 2 diabetes mellitus without complications: Principal | ICD-10-CM

## 2018-06-04 DIAGNOSIS — I10 Essential (primary) hypertension: Principal | ICD-10-CM

## 2018-06-04 DIAGNOSIS — Z79899 Other long term (current) drug therapy: Principal | ICD-10-CM

## 2018-06-04 DIAGNOSIS — R42 Dizziness and giddiness: Principal | ICD-10-CM

## 2018-06-04 DIAGNOSIS — H8112 Benign paroxysmal vertigo, left ear: Principal | ICD-10-CM

## 2018-08-02 ENCOUNTER — Ambulatory Visit
Admit: 2018-08-02 | Discharge: 2018-08-04 | Disposition: A | Discharge status: Home / Self Care | Payer: MEDICAID | Source: Ambulatory Visit

## 2018-08-02 DIAGNOSIS — E876 Hypokalemia: Principal | ICD-10-CM

## 2018-08-03 DIAGNOSIS — E876 Hypokalemia: Principal | ICD-10-CM

## 2018-08-04 MED ORDER — MAGNESIUM OXIDE 400 MG (241.3 MG MAGNESIUM) TABLET
ORAL_TABLET | Freq: Two times a day (BID) | ORAL | 1 refills | 0 days | Status: CP
Start: 2018-08-04 — End: 2018-08-12

## 2018-08-04 MED ORDER — POTASSIUM CHLORIDE ER 20 MEQ TABLET,EXTENDED RELEASE(PART/CRYST)
ORAL_TABLET | Freq: Two times a day (BID) | ORAL | 1 refills | 0 days | Status: CP
Start: 2018-08-04 — End: 2018-08-12

## 2018-08-05 MED ORDER — SPIRONOLACTONE 50 MG TABLET
ORAL_TABLET | Freq: Every day | ORAL | 0 refills | 0.00000 days | Status: CP
Start: 2018-08-05 — End: 2018-08-12

## 2018-08-10 ENCOUNTER — Ambulatory Visit: Admit: 2018-08-10 | Discharge: 2018-08-12

## 2018-08-12 MED ORDER — FUROSEMIDE 40 MG TABLET
ORAL_TABLET | Freq: Every day | ORAL | 0 refills | 0.00000 days | Status: CP
Start: 2018-08-12 — End: 2018-08-12

## 2018-10-17 ENCOUNTER — Ambulatory Visit: Admit: 2018-10-17 | Discharge: 2018-10-19 | Disposition: A | Discharge status: Home / Self Care | Payer: MEDICAID

## 2018-10-17 DIAGNOSIS — I160 Hypertensive urgency: Principal | ICD-10-CM

## 2018-10-19 DIAGNOSIS — I160 Hypertensive urgency: Principal | ICD-10-CM

## 2018-10-19 MED ORDER — AMLODIPINE 5 MG TABLET
ORAL_TABLET | Freq: Every day | ORAL | 0 refills | 30 days | Status: CP
Start: 2018-10-19 — End: ?

## 2018-10-19 MED ORDER — CARVEDILOL 25 MG TABLET
ORAL_TABLET | Freq: Two times a day (BID) | ORAL | 0 refills | 30.00000 days | Status: CP
Start: 2018-10-19 — End: ?

## 2018-10-20 MED ORDER — ISOSORBIDE MONONITRATE ER 120 MG TABLET,EXTENDED RELEASE 24 HR
ORAL_TABLET | Freq: Every day | ORAL | 0 refills | 30.00000 days | Status: CP
Start: 2018-10-20 — End: 2018-11-19

## 2018-12-15 DIAGNOSIS — Z794 Long term (current) use of insulin: Secondary | ICD-10-CM

## 2018-12-15 DIAGNOSIS — Z87891 Personal history of nicotine dependence: Secondary | ICD-10-CM

## 2018-12-15 DIAGNOSIS — N179 Acute kidney failure, unspecified: Secondary | ICD-10-CM

## 2018-12-15 DIAGNOSIS — N189 Chronic kidney disease, unspecified: Secondary | ICD-10-CM

## 2018-12-15 DIAGNOSIS — M8458XA Pathological fracture in neoplastic disease, other specified site, initial encounter for fracture: Secondary | ICD-10-CM

## 2018-12-15 DIAGNOSIS — E1140 Type 2 diabetes mellitus with diabetic neuropathy, unspecified: Secondary | ICD-10-CM

## 2018-12-15 DIAGNOSIS — E11649 Type 2 diabetes mellitus with hypoglycemia without coma: Secondary | ICD-10-CM

## 2018-12-15 DIAGNOSIS — Z20828 Contact with and (suspected) exposure to other viral communicable diseases: Secondary | ICD-10-CM

## 2018-12-15 DIAGNOSIS — G4700 Insomnia, unspecified: Secondary | ICD-10-CM

## 2018-12-15 DIAGNOSIS — M84553A Pathological fracture in neoplastic disease, unspecified femur, initial encounter for fracture: Secondary | ICD-10-CM

## 2018-12-15 DIAGNOSIS — C7951 Secondary malignant neoplasm of bone: Secondary | ICD-10-CM

## 2018-12-15 DIAGNOSIS — I130 Hypertensive heart and chronic kidney disease with heart failure and stage 1 through stage 4 chronic kidney disease, or unspecified chronic kidney disease: Secondary | ICD-10-CM

## 2018-12-15 DIAGNOSIS — Z6834 Body mass index (BMI) 34.0-34.9, adult: Secondary | ICD-10-CM

## 2018-12-15 DIAGNOSIS — Z79899 Other long term (current) drug therapy: Secondary | ICD-10-CM

## 2018-12-15 DIAGNOSIS — I5032 Chronic diastolic (congestive) heart failure: Secondary | ICD-10-CM

## 2018-12-15 DIAGNOSIS — E1122 Type 2 diabetes mellitus with diabetic chronic kidney disease: Secondary | ICD-10-CM

## 2018-12-15 DIAGNOSIS — C50912 Malignant neoplasm of unspecified site of left female breast: Secondary | ICD-10-CM

## 2018-12-15 DIAGNOSIS — E440 Moderate protein-calorie malnutrition: Secondary | ICD-10-CM

## 2018-12-15 DIAGNOSIS — E785 Hyperlipidemia, unspecified: Secondary | ICD-10-CM

## 2018-12-15 DIAGNOSIS — Z8739 Personal history of other diseases of the musculoskeletal system and connective tissue: Secondary | ICD-10-CM

## 2018-12-15 DIAGNOSIS — E1043 Type 1 diabetes mellitus with diabetic autonomic (poly)neuropathy: Secondary | ICD-10-CM

## 2018-12-15 DIAGNOSIS — R531 Weakness: Secondary | ICD-10-CM

## 2018-12-16 ENCOUNTER — Ambulatory Visit
Admit: 2018-12-16 | Discharge: 2018-12-22 | Disposition: A | Discharge status: Home Health | Payer: MEDICAID | Attending: Radiation Oncology

## 2018-12-16 ENCOUNTER — Ambulatory Visit: Admit: 2018-12-16 | Discharge: 2018-12-22 | Disposition: A | Discharge status: Home Health | Payer: MEDICAID

## 2018-12-16 ENCOUNTER — Ambulatory Visit
Admit: 2018-12-16 | Discharge: 2018-12-22 | Disposition: A | Discharge status: Home Health | Payer: MEDICAID | Attending: General Practice

## 2018-12-16 DIAGNOSIS — I5032 Chronic diastolic (congestive) heart failure: Secondary | ICD-10-CM

## 2018-12-16 DIAGNOSIS — Z79899 Other long term (current) drug therapy: Secondary | ICD-10-CM

## 2018-12-16 DIAGNOSIS — Z20828 Contact with and (suspected) exposure to other viral communicable diseases: Secondary | ICD-10-CM

## 2018-12-16 DIAGNOSIS — E11649 Type 2 diabetes mellitus with hypoglycemia without coma: Secondary | ICD-10-CM

## 2018-12-16 DIAGNOSIS — E785 Hyperlipidemia, unspecified: Secondary | ICD-10-CM

## 2018-12-16 DIAGNOSIS — M84553A Pathological fracture in neoplastic disease, unspecified femur, initial encounter for fracture: Secondary | ICD-10-CM

## 2018-12-16 DIAGNOSIS — N179 Acute kidney failure, unspecified: Secondary | ICD-10-CM

## 2018-12-16 DIAGNOSIS — Z794 Long term (current) use of insulin: Secondary | ICD-10-CM

## 2018-12-16 DIAGNOSIS — C7951 Secondary malignant neoplasm of bone: Secondary | ICD-10-CM

## 2018-12-16 DIAGNOSIS — Z6834 Body mass index (BMI) 34.0-34.9, adult: Secondary | ICD-10-CM

## 2018-12-16 DIAGNOSIS — E440 Moderate protein-calorie malnutrition: Secondary | ICD-10-CM

## 2018-12-16 DIAGNOSIS — Z8739 Personal history of other diseases of the musculoskeletal system and connective tissue: Secondary | ICD-10-CM

## 2018-12-16 DIAGNOSIS — G4700 Insomnia, unspecified: Secondary | ICD-10-CM

## 2018-12-16 DIAGNOSIS — E1122 Type 2 diabetes mellitus with diabetic chronic kidney disease: Secondary | ICD-10-CM

## 2018-12-16 DIAGNOSIS — Z87891 Personal history of nicotine dependence: Secondary | ICD-10-CM

## 2018-12-16 DIAGNOSIS — C50912 Malignant neoplasm of unspecified site of left female breast: Secondary | ICD-10-CM

## 2018-12-16 DIAGNOSIS — M8458XA Pathological fracture in neoplastic disease, other specified site, initial encounter for fracture: Secondary | ICD-10-CM

## 2018-12-16 DIAGNOSIS — N189 Chronic kidney disease, unspecified: Secondary | ICD-10-CM

## 2018-12-16 DIAGNOSIS — I130 Hypertensive heart and chronic kidney disease with heart failure and stage 1 through stage 4 chronic kidney disease, or unspecified chronic kidney disease: Secondary | ICD-10-CM

## 2018-12-16 DIAGNOSIS — E1140 Type 2 diabetes mellitus with diabetic neuropathy, unspecified: Secondary | ICD-10-CM

## 2018-12-17 DIAGNOSIS — N189 Chronic kidney disease, unspecified: Secondary | ICD-10-CM

## 2018-12-17 DIAGNOSIS — Z794 Long term (current) use of insulin: Secondary | ICD-10-CM

## 2018-12-17 DIAGNOSIS — E11649 Type 2 diabetes mellitus with hypoglycemia without coma: Secondary | ICD-10-CM

## 2018-12-17 DIAGNOSIS — Z87891 Personal history of nicotine dependence: Secondary | ICD-10-CM

## 2018-12-17 DIAGNOSIS — M8458XA Pathological fracture in neoplastic disease, other specified site, initial encounter for fracture: Secondary | ICD-10-CM

## 2018-12-17 DIAGNOSIS — M84553A Pathological fracture in neoplastic disease, unspecified femur, initial encounter for fracture: Secondary | ICD-10-CM

## 2018-12-17 DIAGNOSIS — E785 Hyperlipidemia, unspecified: Secondary | ICD-10-CM

## 2018-12-17 DIAGNOSIS — C50912 Malignant neoplasm of unspecified site of left female breast: Secondary | ICD-10-CM

## 2018-12-17 DIAGNOSIS — Z6834 Body mass index (BMI) 34.0-34.9, adult: Secondary | ICD-10-CM

## 2018-12-17 DIAGNOSIS — Z79899 Other long term (current) drug therapy: Secondary | ICD-10-CM

## 2018-12-17 DIAGNOSIS — E1140 Type 2 diabetes mellitus with diabetic neuropathy, unspecified: Secondary | ICD-10-CM

## 2018-12-17 DIAGNOSIS — I5032 Chronic diastolic (congestive) heart failure: Secondary | ICD-10-CM

## 2018-12-17 DIAGNOSIS — I130 Hypertensive heart and chronic kidney disease with heart failure and stage 1 through stage 4 chronic kidney disease, or unspecified chronic kidney disease: Secondary | ICD-10-CM

## 2018-12-17 DIAGNOSIS — G4700 Insomnia, unspecified: Secondary | ICD-10-CM

## 2018-12-17 DIAGNOSIS — E1122 Type 2 diabetes mellitus with diabetic chronic kidney disease: Secondary | ICD-10-CM

## 2018-12-17 DIAGNOSIS — Z8739 Personal history of other diseases of the musculoskeletal system and connective tissue: Secondary | ICD-10-CM

## 2018-12-17 DIAGNOSIS — N179 Acute kidney failure, unspecified: Secondary | ICD-10-CM

## 2018-12-17 DIAGNOSIS — C7951 Secondary malignant neoplasm of bone: Secondary | ICD-10-CM

## 2018-12-17 DIAGNOSIS — Z20828 Contact with and (suspected) exposure to other viral communicable diseases: Secondary | ICD-10-CM

## 2018-12-17 DIAGNOSIS — E440 Moderate protein-calorie malnutrition: Secondary | ICD-10-CM

## 2018-12-18 DIAGNOSIS — Z87891 Personal history of nicotine dependence: Secondary | ICD-10-CM

## 2018-12-18 DIAGNOSIS — N179 Acute kidney failure, unspecified: Secondary | ICD-10-CM

## 2018-12-18 DIAGNOSIS — Z79899 Other long term (current) drug therapy: Secondary | ICD-10-CM

## 2018-12-18 DIAGNOSIS — E1140 Type 2 diabetes mellitus with diabetic neuropathy, unspecified: Secondary | ICD-10-CM

## 2018-12-18 DIAGNOSIS — E440 Moderate protein-calorie malnutrition: Secondary | ICD-10-CM

## 2018-12-18 DIAGNOSIS — E1122 Type 2 diabetes mellitus with diabetic chronic kidney disease: Secondary | ICD-10-CM

## 2018-12-18 DIAGNOSIS — M84553A Pathological fracture in neoplastic disease, unspecified femur, initial encounter for fracture: Secondary | ICD-10-CM

## 2018-12-18 DIAGNOSIS — M8458XA Pathological fracture in neoplastic disease, other specified site, initial encounter for fracture: Secondary | ICD-10-CM

## 2018-12-18 DIAGNOSIS — Z8739 Personal history of other diseases of the musculoskeletal system and connective tissue: Secondary | ICD-10-CM

## 2018-12-18 DIAGNOSIS — Z20828 Contact with and (suspected) exposure to other viral communicable diseases: Secondary | ICD-10-CM

## 2018-12-18 DIAGNOSIS — C50912 Malignant neoplasm of unspecified site of left female breast: Secondary | ICD-10-CM

## 2018-12-18 DIAGNOSIS — G4700 Insomnia, unspecified: Secondary | ICD-10-CM

## 2018-12-18 DIAGNOSIS — I130 Hypertensive heart and chronic kidney disease with heart failure and stage 1 through stage 4 chronic kidney disease, or unspecified chronic kidney disease: Secondary | ICD-10-CM

## 2018-12-18 DIAGNOSIS — Z794 Long term (current) use of insulin: Secondary | ICD-10-CM

## 2018-12-18 DIAGNOSIS — I5032 Chronic diastolic (congestive) heart failure: Secondary | ICD-10-CM

## 2018-12-18 DIAGNOSIS — E785 Hyperlipidemia, unspecified: Secondary | ICD-10-CM

## 2018-12-18 DIAGNOSIS — Z6834 Body mass index (BMI) 34.0-34.9, adult: Secondary | ICD-10-CM

## 2018-12-18 DIAGNOSIS — E11649 Type 2 diabetes mellitus with hypoglycemia without coma: Secondary | ICD-10-CM

## 2018-12-18 DIAGNOSIS — N189 Chronic kidney disease, unspecified: Secondary | ICD-10-CM

## 2018-12-18 DIAGNOSIS — C7951 Secondary malignant neoplasm of bone: Secondary | ICD-10-CM

## 2018-12-21 ENCOUNTER — Ambulatory Visit: Admit: 2018-12-21 | Discharge: 2018-12-22 | Attending: Radiation Oncology | Primary: Radiation Oncology

## 2018-12-21 DIAGNOSIS — E1122 Type 2 diabetes mellitus with diabetic chronic kidney disease: Secondary | ICD-10-CM

## 2018-12-21 DIAGNOSIS — Z87891 Personal history of nicotine dependence: Secondary | ICD-10-CM

## 2018-12-21 DIAGNOSIS — G4700 Insomnia, unspecified: Secondary | ICD-10-CM

## 2018-12-21 DIAGNOSIS — E440 Moderate protein-calorie malnutrition: Secondary | ICD-10-CM

## 2018-12-21 DIAGNOSIS — N179 Acute kidney failure, unspecified: Secondary | ICD-10-CM

## 2018-12-21 DIAGNOSIS — Z794 Long term (current) use of insulin: Secondary | ICD-10-CM

## 2018-12-21 DIAGNOSIS — C50912 Malignant neoplasm of unspecified site of left female breast: Secondary | ICD-10-CM

## 2018-12-21 DIAGNOSIS — Z79899 Other long term (current) drug therapy: Secondary | ICD-10-CM

## 2018-12-21 DIAGNOSIS — E1140 Type 2 diabetes mellitus with diabetic neuropathy, unspecified: Secondary | ICD-10-CM

## 2018-12-21 DIAGNOSIS — Z8739 Personal history of other diseases of the musculoskeletal system and connective tissue: Secondary | ICD-10-CM

## 2018-12-21 DIAGNOSIS — I130 Hypertensive heart and chronic kidney disease with heart failure and stage 1 through stage 4 chronic kidney disease, or unspecified chronic kidney disease: Secondary | ICD-10-CM

## 2018-12-21 DIAGNOSIS — M84553A Pathological fracture in neoplastic disease, unspecified femur, initial encounter for fracture: Secondary | ICD-10-CM

## 2018-12-21 DIAGNOSIS — Z20828 Contact with and (suspected) exposure to other viral communicable diseases: Secondary | ICD-10-CM

## 2018-12-21 DIAGNOSIS — C50919 Malignant neoplasm of unspecified site of unspecified female breast: Principal | ICD-10-CM

## 2018-12-21 DIAGNOSIS — N189 Chronic kidney disease, unspecified: Secondary | ICD-10-CM

## 2018-12-21 DIAGNOSIS — I5032 Chronic diastolic (congestive) heart failure: Secondary | ICD-10-CM

## 2018-12-21 DIAGNOSIS — E785 Hyperlipidemia, unspecified: Secondary | ICD-10-CM

## 2018-12-21 DIAGNOSIS — Z6834 Body mass index (BMI) 34.0-34.9, adult: Secondary | ICD-10-CM

## 2018-12-21 DIAGNOSIS — C7951 Secondary malignant neoplasm of bone: Secondary | ICD-10-CM

## 2018-12-21 DIAGNOSIS — M8458XA Pathological fracture in neoplastic disease, other specified site, initial encounter for fracture: Secondary | ICD-10-CM

## 2018-12-21 DIAGNOSIS — E11649 Type 2 diabetes mellitus with hypoglycemia without coma: Secondary | ICD-10-CM

## 2018-12-21 DIAGNOSIS — Z0120 Encounter for dental examination and cleaning without abnormal findings: Secondary | ICD-10-CM

## 2018-12-22 ENCOUNTER — Ambulatory Visit: Admit: 2018-12-22 | Discharge: 2018-12-23

## 2018-12-22 MED ORDER — OXYCODONE 10 MG TABLET
ORAL_TABLET | ORAL | 0 refills | 5 days | Status: SS | PRN
Start: 2018-12-22 — End: 2019-01-05

## 2018-12-22 MED ORDER — INSULIN HUMAN U-100 NPH-REGULR 70-30 MIX 100 UNIT/ML SUBCUTANEOUS SUSP
Freq: Two times a day (BID) | SUBCUTANEOUS | 0 refills | 30 days | Status: CP
Start: 2018-12-22 — End: 2019-01-21

## 2018-12-24 ENCOUNTER — Ambulatory Visit: Admit: 2018-12-24 | Discharge: 2018-12-26 | Payer: MEDICAID

## 2018-12-24 DIAGNOSIS — C7951 Secondary malignant neoplasm of bone: Secondary | ICD-10-CM

## 2018-12-24 DIAGNOSIS — C50012 Malignant neoplasm of nipple and areola, left female breast: Secondary | ICD-10-CM

## 2018-12-24 DIAGNOSIS — Z510 Encounter for antineoplastic radiation therapy: Secondary | ICD-10-CM

## 2018-12-25 ENCOUNTER — Encounter
Admit: 2018-12-25 | Discharge: 2018-12-25 | Payer: MEDICAID | Attending: Geriatric Medicine | Primary: Geriatric Medicine

## 2018-12-25 IMAGING — CR DG CHEST 2V
2 series · 2 of 2 positions shown · non-contrast
Comparison: March 22, 2015

CLINICAL DATA: Mid chest pain.

EXAM:
CHEST  2 VIEW

[chest pa]
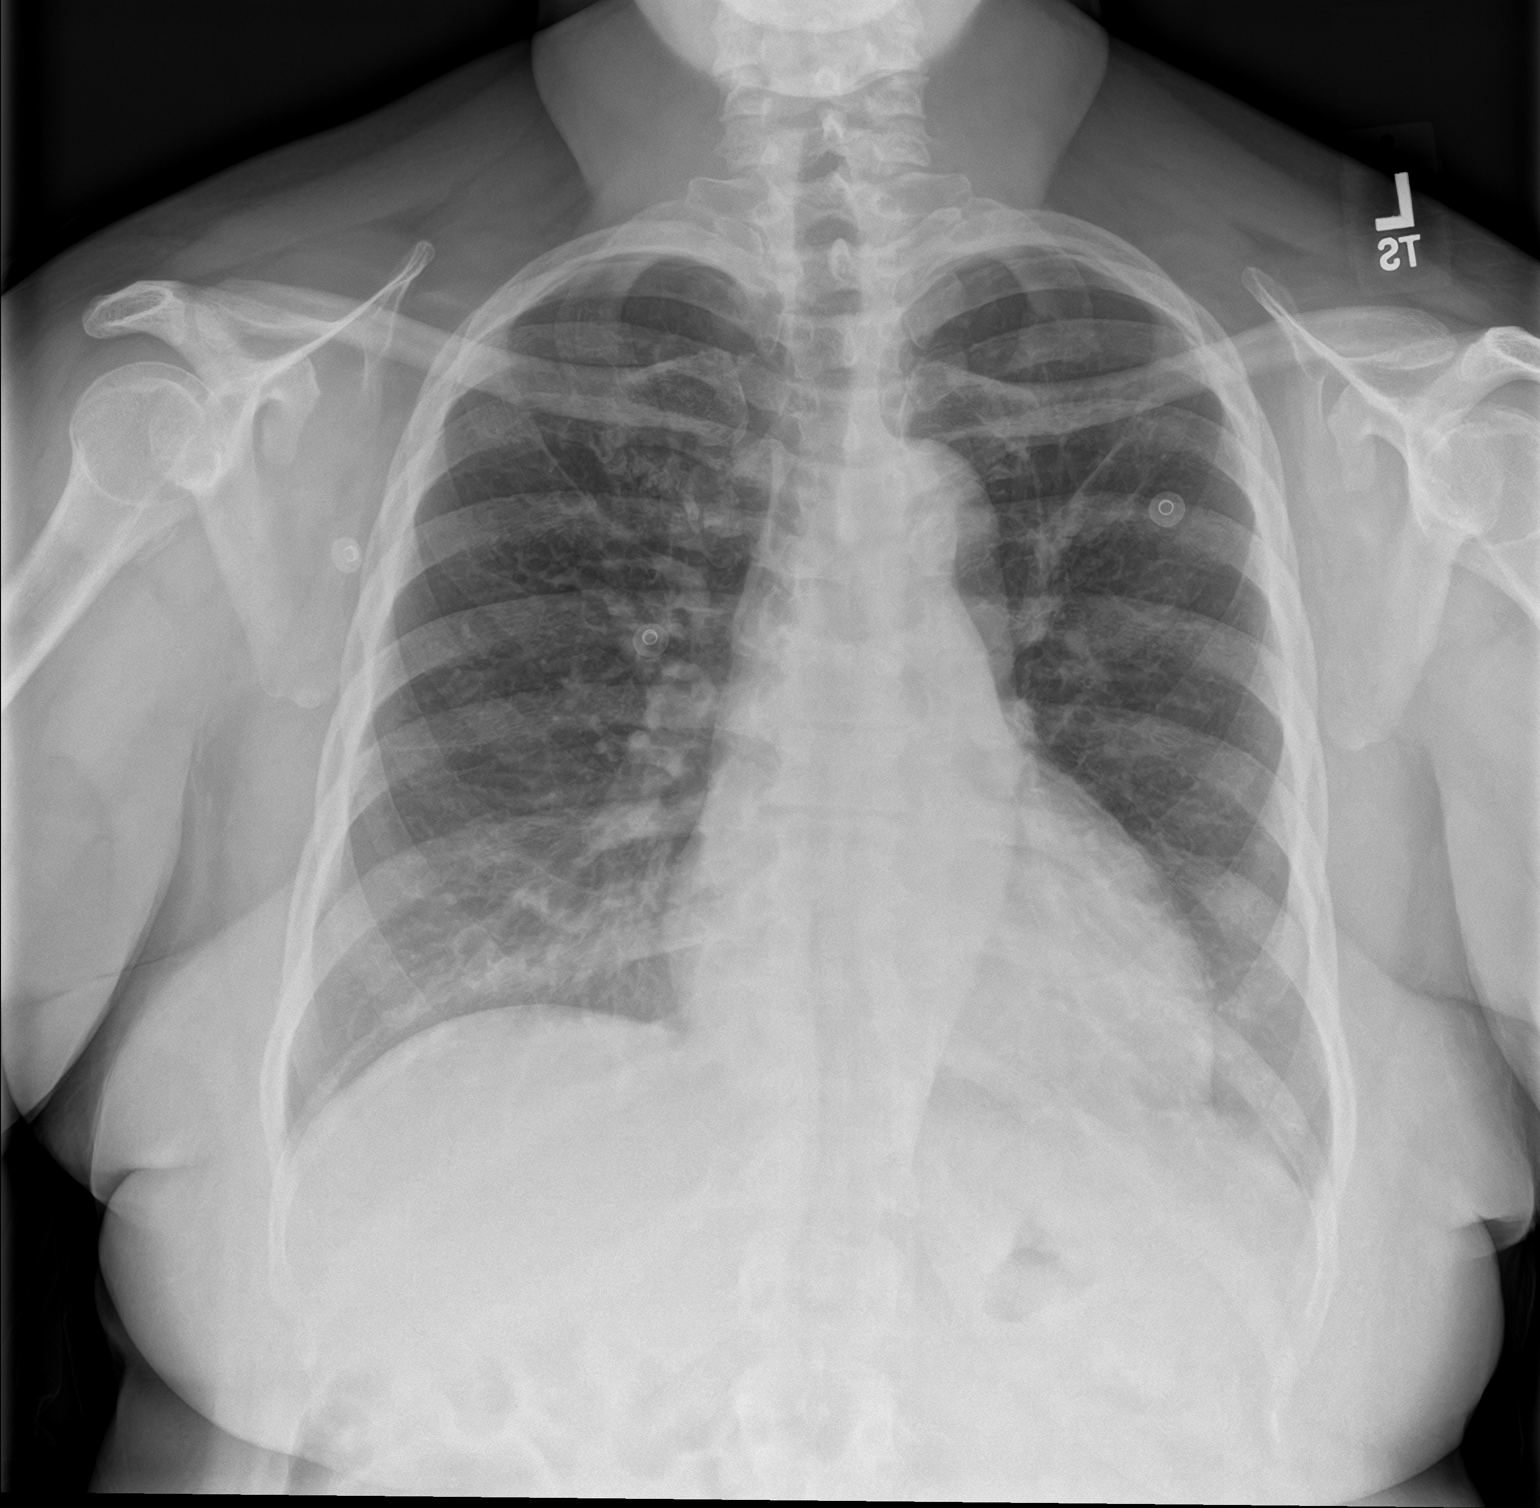

[chest lat]
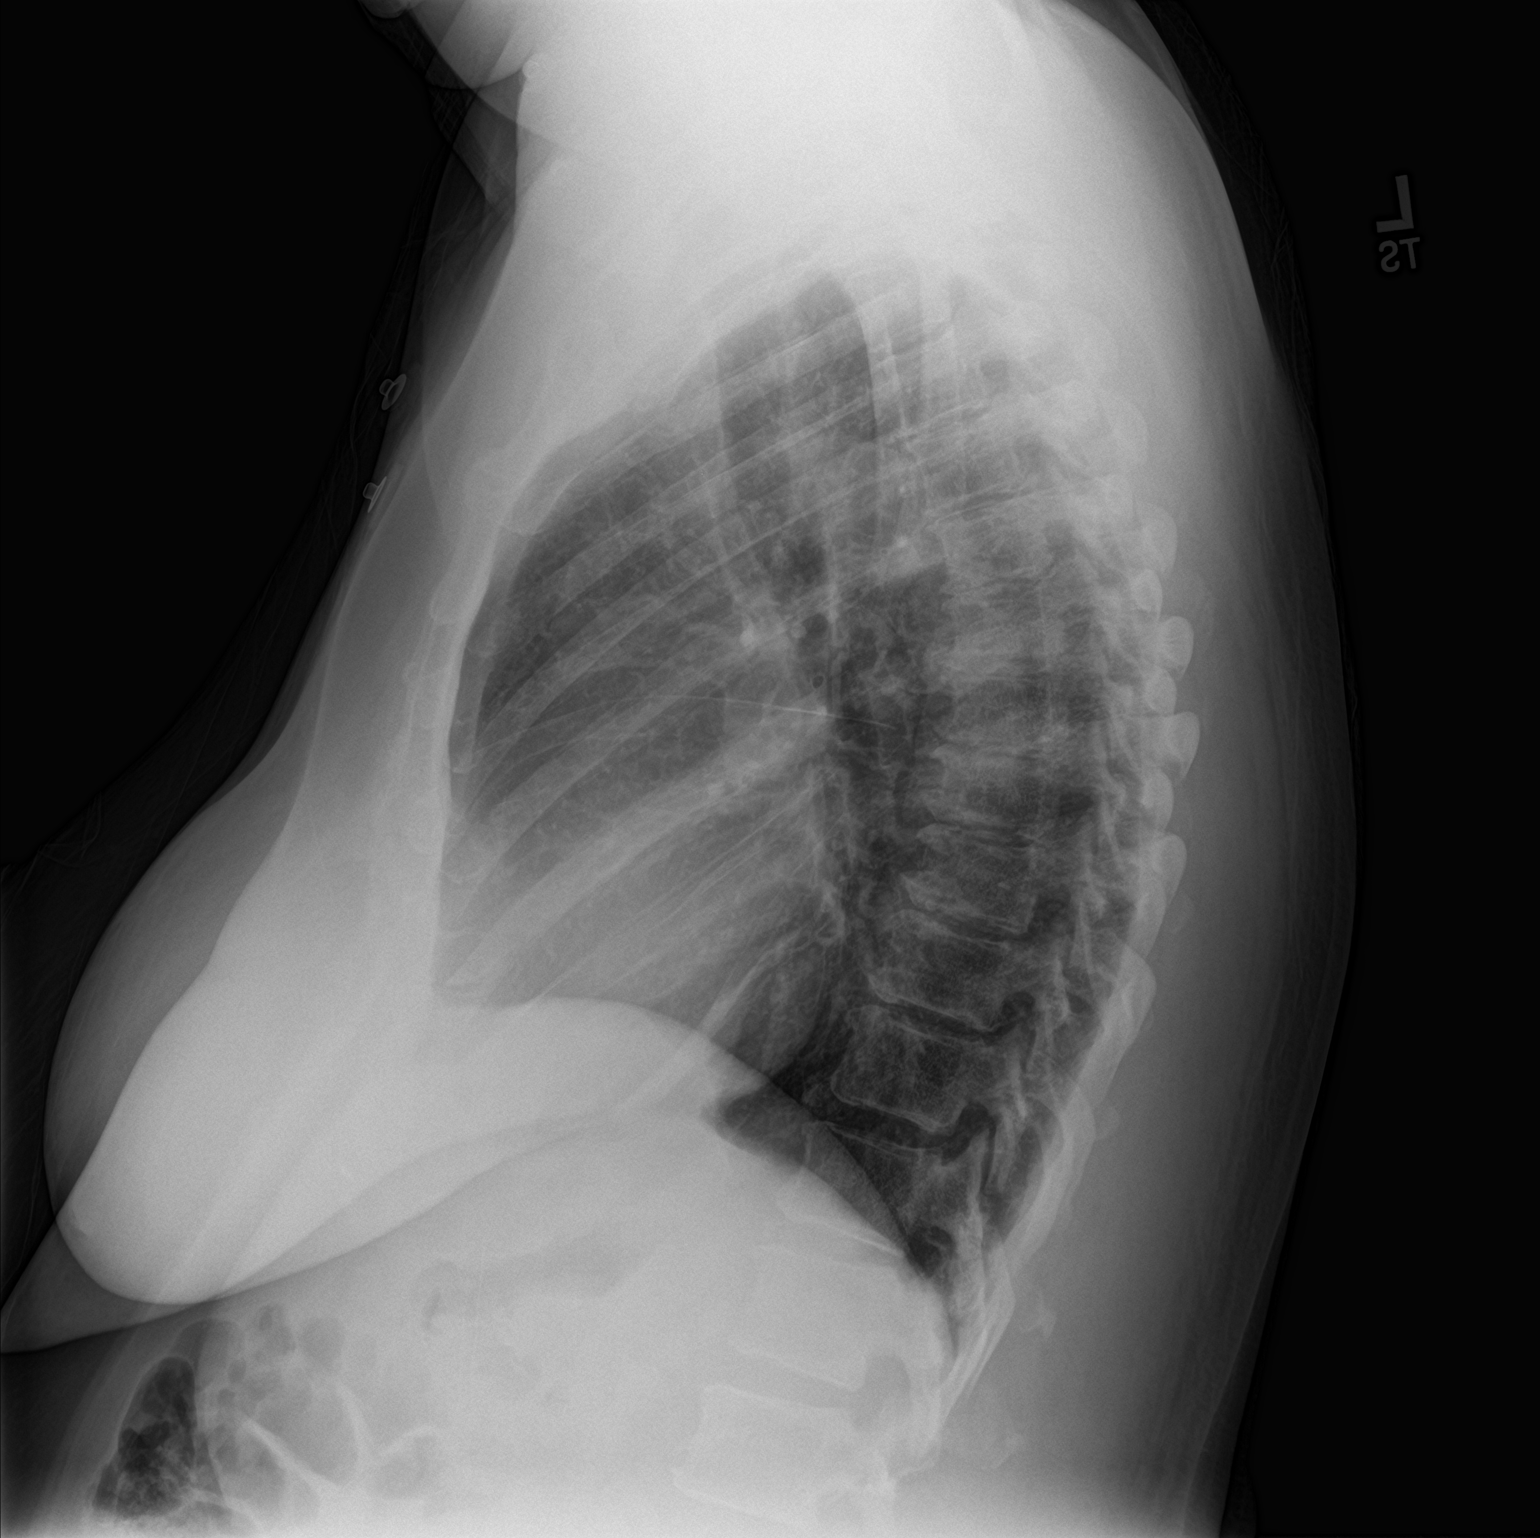

[2 of 2 positions shown; findings below may reference images not displayed]

FINDINGS: The heart, hila, and mediastinum are normal. No pneumothorax.
Coarsened markings in the bases without focal infiltrate. No other
acute abnormalities identified.
IMPRESSION: Coarsened lung markings in the bases, right greater than left,
suggest bronchitic change.

## 2018-12-25 MED ORDER — ONDANSETRON 4 MG DISINTEGRATING TABLET
ORAL_TABLET | Freq: Three times a day (TID) | ORAL | 1 refills | 10.00000 days | Status: CP | PRN
Start: 2018-12-25 — End: 2018-12-27

## 2018-12-27 ENCOUNTER — Encounter
Admit: 2018-12-27 | Discharge: 2018-12-28 | Payer: MEDICAID | Attending: Geriatric Medicine | Primary: Geriatric Medicine

## 2018-12-27 ENCOUNTER — Ambulatory Visit: Admit: 2018-12-27 | Discharge: 2019-01-26 | Payer: MEDICAID

## 2018-12-27 DIAGNOSIS — T451X5A Adverse effect of antineoplastic and immunosuppressive drugs, initial encounter: Secondary | ICD-10-CM

## 2018-12-27 DIAGNOSIS — G893 Neoplasm related pain (acute) (chronic): Secondary | ICD-10-CM

## 2018-12-27 DIAGNOSIS — Z5111 Encounter for antineoplastic chemotherapy: Secondary | ICD-10-CM

## 2018-12-27 DIAGNOSIS — K5903 Drug induced constipation: Secondary | ICD-10-CM

## 2018-12-27 DIAGNOSIS — C50919 Malignant neoplasm of unspecified site of unspecified female breast: Secondary | ICD-10-CM

## 2018-12-27 DIAGNOSIS — C7951 Secondary malignant neoplasm of bone: Secondary | ICD-10-CM

## 2018-12-27 DIAGNOSIS — C50312 Malignant neoplasm of lower-inner quadrant of left female breast: Principal | ICD-10-CM

## 2018-12-27 DIAGNOSIS — Z171 Estrogen receptor negative status [ER-]: Secondary | ICD-10-CM

## 2018-12-27 DIAGNOSIS — R112 Nausea with vomiting, unspecified: Secondary | ICD-10-CM

## 2018-12-27 MED ORDER — SENNOSIDES 8.6 MG TABLET
ORAL_TABLET | Freq: Every evening | ORAL | 11 refills | 30 days | Status: CP
Start: 2018-12-27 — End: 2019-12-27

## 2018-12-27 MED ORDER — POLYETHYLENE GLYCOL 3350 17 GRAM/DOSE ORAL POWDER
Freq: Every day | ORAL | 0 refills | 15 days | Status: CP | PRN
Start: 2018-12-27 — End: ?

## 2018-12-27 MED ORDER — SODIUM PHOSPHATES 19 GRAM-7 GRAM/118 ML ENEMA
ENEMA | Freq: Once | RECTAL | 0 refills | 1.00000 days | Status: CP
Start: 2018-12-27 — End: 2019-01-05

## 2018-12-27 MED ORDER — ONDANSETRON 4 MG DISINTEGRATING TABLET
ORAL_TABLET | Freq: Four times a day (QID) | ORAL | 1 refills | 8.00000 days | Status: SS | PRN
Start: 2018-12-27 — End: 2019-01-05

## 2018-12-28 ENCOUNTER — Encounter: Admit: 2018-12-28 | Discharge: 2018-12-29 | Payer: MEDICAID

## 2018-12-28 DIAGNOSIS — S322XXA Fracture of coccyx, initial encounter for closed fracture: Secondary | ICD-10-CM

## 2018-12-28 DIAGNOSIS — S3210XA Unspecified fracture of sacrum, initial encounter for closed fracture: Principal | ICD-10-CM

## 2019-01-02 ENCOUNTER — Ambulatory Visit: Admit: 2019-01-02 | Discharge: 2019-01-15 | Payer: MEDICAID

## 2019-01-02 DIAGNOSIS — C7951 Secondary malignant neoplasm of bone: Secondary | ICD-10-CM

## 2019-01-02 DIAGNOSIS — C50919 Malignant neoplasm of unspecified site of unspecified female breast: Principal | ICD-10-CM

## 2019-01-03 DIAGNOSIS — R112 Nausea with vomiting, unspecified: Secondary | ICD-10-CM

## 2019-01-03 DIAGNOSIS — I130 Hypertensive heart and chronic kidney disease with heart failure and stage 1 through stage 4 chronic kidney disease, or unspecified chronic kidney disease: Secondary | ICD-10-CM

## 2019-01-03 DIAGNOSIS — E785 Hyperlipidemia, unspecified: Secondary | ICD-10-CM

## 2019-01-03 DIAGNOSIS — R197 Diarrhea, unspecified: Secondary | ICD-10-CM

## 2019-01-03 DIAGNOSIS — Z794 Long term (current) use of insulin: Secondary | ICD-10-CM

## 2019-01-03 DIAGNOSIS — E1122 Type 2 diabetes mellitus with diabetic chronic kidney disease: Secondary | ICD-10-CM

## 2019-01-03 DIAGNOSIS — T451X5A Adverse effect of antineoplastic and immunosuppressive drugs, initial encounter: Secondary | ICD-10-CM

## 2019-01-03 DIAGNOSIS — R1110 Vomiting, unspecified: Secondary | ICD-10-CM

## 2019-01-03 DIAGNOSIS — C50919 Malignant neoplasm of unspecified site of unspecified female breast: Secondary | ICD-10-CM

## 2019-01-03 DIAGNOSIS — C7951 Secondary malignant neoplasm of bone: Secondary | ICD-10-CM

## 2019-01-03 DIAGNOSIS — D649 Anemia, unspecified: Secondary | ICD-10-CM

## 2019-01-03 DIAGNOSIS — I5030 Unspecified diastolic (congestive) heart failure: Secondary | ICD-10-CM

## 2019-01-03 DIAGNOSIS — N189 Chronic kidney disease, unspecified: Secondary | ICD-10-CM

## 2019-01-03 DIAGNOSIS — Z20828 Contact with and (suspected) exposure to other viral communicable diseases: Secondary | ICD-10-CM

## 2019-01-03 DIAGNOSIS — Z79899 Other long term (current) drug therapy: Secondary | ICD-10-CM

## 2019-01-03 DIAGNOSIS — Z87891 Personal history of nicotine dependence: Secondary | ICD-10-CM

## 2019-01-04 ENCOUNTER — Encounter: Admit: 2019-01-04 | Discharge: 2019-01-05 | Payer: MEDICAID

## 2019-01-04 ENCOUNTER — Ambulatory Visit: Admit: 2019-01-04 | Discharge: 2019-01-05 | Payer: MEDICAID

## 2019-01-05 MED ORDER — ONDANSETRON 4 MG DISINTEGRATING TABLET
ORAL_TABLET | Freq: Four times a day (QID) | ORAL | 0 refills | 8.00000 days | Status: CP | PRN
Start: 2019-01-05 — End: 2019-02-04

## 2019-01-05 MED ORDER — HYDRALAZINE 100 MG TABLET
ORAL_TABLET | Freq: Three times a day (TID) | ORAL | 0 refills | 30 days | Status: CP
Start: 2019-01-05 — End: 2019-02-04

## 2019-01-05 MED ORDER — CHOLECALCIFEROL (VITAMIN D3) 25 MCG (1,000 UNIT) TABLET
ORAL_TABLET | Freq: Every day | ORAL | 11 refills | 30.00000 days | Status: CP
Start: 2019-01-05 — End: 2020-01-05

## 2019-01-05 MED ORDER — AMLODIPINE 5 MG TABLET
Freq: Every day | ORAL | 0.00000 days
Start: 2019-01-05 — End: ?

## 2019-01-05 MED ORDER — ISOSORBIDE MONONITRATE ER 60 MG TABLET,EXTENDED RELEASE 24 HR
ORAL_TABLET | Freq: Every day | ORAL | 0 refills | 90 days | Status: CP
Start: 2019-01-05 — End: ?

## 2019-01-05 MED ORDER — OXYCODONE 10 MG TABLET
ORAL_TABLET | ORAL | 0 refills | 5.00000 days | PRN
Start: 2019-01-05 — End: 2019-01-10

## 2019-01-08 DIAGNOSIS — C7951 Secondary malignant neoplasm of bone: Principal | ICD-10-CM

## 2019-01-08 DIAGNOSIS — C50919 Malignant neoplasm of unspecified site of unspecified female breast: Principal | ICD-10-CM

## 2019-01-08 MED ORDER — CAPECITABINE 500 MG TABLET: 2000 mg | tablet | Freq: Two times a day (BID) | 3 refills | 14 days | Status: AC

## 2019-01-08 NOTE — Unmapped (Signed)
Return Patient Evaluation    Referring Physician: [facility], [facility]*    PCP: [facility]    Consulting Physicians: Surgical oncology: NA.  Radiation oncology: Eliberto Ivory.    Reason for Visit: The patient is seen in consultation at the request of Dr. Redmond Pulling Healt* for evaluation of breast cancer.    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  First line: Planning Abraxane atezolizumab if PDL 1+.  Capecitabine if not.  Second line: Clinical trial or TBD.    Genomics: Harmony and strata pending.  PDL 1 pending.  Bone directed therapy: Plan Xgeva or Zometa.  Radiation: 12/21/2018??? 01/02/2019: Palliative radiation to pelvis/proximal femur  -----------------------------------------------------------------------------------------------------  Assessment: The following issues were discussed: Alexandra Richards is a 58 y.o. 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 We discussed that MBC is not curable with current technologies but is treatable. Will need distant (likely bone biopsy) at some point. Baseline bone scan with uptake in spine, left hemisacrum + path fracture (received RT) and uptake in left rib, right pubic ramus, and bilateral femora. Plan to treat with SOC Abraxane + atezolizumab however PDL1 testing was negative, therefore will will plan to treat with Capecitabine. Currently no trials and first-line triple negative setting.     She is here today to discuss systemic therapy, unfortunately PDL1 was negative and therefore we would recommend starting with capecitabine. Given her baseline Cr/CKD will plan to start at 1500 mg BID for 14 days on and 7 days off. She will have formal chemotherapy teaching with Aimee Faso today, and [facility] will reach out with plans to get drug delivered to her by end of this week.     Biggest concern today is pain, she has not been using her oxycodone regularly as she was not given many after her last discharge. Today her pain is 9/10 in the left hip, she notes when she uses the ocy 10 mg it is very helpful for pain, but she has not taken this in the last 24 hrs. She is using tylenol which only helps sometime, worse today after having to sit in the car. Refilled oxy 10 mg to use every 6 hours, encouraged to continue to alternate with tylenol. We reviewed bowel regimen to prevent constipation on narcotics, she denies any constipation. We discussed using a stool softener 1-2 times a day when taking oxy, and to add senna or Mirilax if > 48 hrs without BM. Nausea has been well controlled with Zofran ODT 4 mg every 6 hours. We discussed nausea management on capecitabine, and I sent in compazine for her to use alternating with Zofran as needed.     She still has Medicaid pending, and has completed her [facility] application but did not turn it in yet. I encouraged to fax in today if at all possible, in order for [place] to give Xgeva today and arranged for medications. Will give Rivka Barbara today, patient will send in [facility] app this afternoon. Will plan for Xgeva every 6 weeks to time up with cape cycles.    Will have her return in 3 weeks with labs + provider visit     Plan    1.  Metastatic left breast cancer  >Systemic therapy: Plan for capecitabine since PDL1 negative, DR d/t CKD - 1500 mg BID for 14 days on and 7 days off  >Palliative Radiation: 12/21/2018??? 01/02/2019: Palliative radiation to pelvis/proximal femur  >Genetic/Genomic: HARMONY and  STRATA pending  >Imaging: 12/16/2018: CT CAP bone and centimeters/subcentimeter pulmonary mets.  Bone scan with several areas of bone lesions.   >Bone Directed therapy: Dental clearance inpatient but apparently never received Zometa.  Plan Rivka Barbara - pending insurance auth or [employer].  >Vasc Access: Will need port at some point.  >Tumor Markers: pending    Comorbidities  > DM: On insulin, hyperglycemia on recent admission. Increased NPH. BS have been 200 or less, no hypoglycemia, has f/u with PCP next month who manages insulin.  > HTN: difficult to manage and on multiple meds.    > CKD: Monitor CR closely.    > HFpEF: Attention to fluid status. On lasix 40 mg daily.     Supportive care  Bone pain/Arthalgias: Controlled with APAP and oxycodone 10 mg, did not have enough oxy so pain today 9/10. Refilled oxy 10 mg #120 tablets to take q6h PRN.   Fatigue: Mild to moderate currently.  Anorexia: Intermittently good - better with control of nausea.  Neuropathy: Mild at baseline.  Has DM.  Constipation: Aggressive bowel regimen - colace 1-2 times daily, add Mirilax or senna PRN  Nausea: admitted for intractable N/V, now on Zofran q6h    Follow-up  -- plan to start capecitabine ASAP  -- Xgeva today  -- RTC in 3 weeks with labs   -----------------------------------------------------------------------------------------------------  No chief complaint on file.      History of the Present Illness: Alexandra Richards is a pleasant 58 y.o. female who  has a past medical history of Diabetes mellitus (CMS-HCC), HLD (hyperlipidemia), Hypertension, and Renal disorder. She is seen in consultation at the request of Dr. Redmond Pulling Healt* for evaluation of breast cancer.    Briefly she has a history of refractory HTN, HLD, HFpEF, CKD, IDDM and had recent ED visits/admissions. She developed right breast soreness and progressive pain pelvic and hip pain. Found to have a left medial breast mass. Biopsy confirmed high-grade carcinoma. ER/PR and HER-2 negative thus essentially triple negative breast cancer. Staging CT showed diffuse osseous mets with pathological fracture of the sacrum andcentimeters and subcentimeter pulmonary nodules. No other visceral or liver mets.  She was seen by orthopedics who recommended nonoperative intervention.  She received 2 fractions of palliative radiation.No FH of breast or ovarian cancer.    Interval History:  Here today for follow-up on systemic treatment.   Unfortunately she was PDL1 negative so plan is for capecitabine.    -- She as admitted from 10/8 to 10/10 after 1 day of intractable nausea/vomiting. She was given several antiemetics and discharged home on Zofran 4 mg q6h. Since discharge she denies any nausea with current regimen.  -- pain today is 9/10, she did not have enough oxy and as been using tylenol with minimal relief. Pain is in her left hip and radiates down her leg. Gave a dose of oxy in clinic which helped. She finds the oxy + tylenol helpful when she was taking it. Denies any other pain  -- no constipation or diarrhea, we discussed bowel regimen on narcotics  -- fatigue most impacted by pain, she does get up out of bed and sit in the recliner. Is able to perform ADL's independently but does need to move slow.   -- appetite somewhat improved with control of nausea  -- BP has been really well controlled since hospital discharge, she reports taking all her BP meds as prescribed. Her BP today was 105/56, she denies any lightheaded or dizziness.  -- glucose  levels have been < 200, denies any hypoglycemia     Review of Systems: A complete twelve systems review was obtained and is positive per the HPI but otherwise negative in detail. See MIMS #1170 where available.    Functional Status: ECOG PS 1???2.  Independent with some ADLs and all IADLs.  Ambulates with walker due to pain.  Cognitively intact.    Oncology History   Malignant neoplasm of lower-inner quadrant of left breast in female, estrogen receptor negative (CMS-HCC)   11/2018 Initial Diagnosis    Malignant neoplasm of lower-inner quadrant of left breast in female, estrogen receptor negative (CMS-HCC)     12/16/2018 Interval Scan(s)    CT Chest:   --3.8 cm left medial breast mass with associated soft tissue stranding, concerning for breast malignancy.  -- Prominent left axillary lymphadenopathy measuring up to 1.6 cm, concerning for nodal metastatic involvement.  -- Numerous bilateral pulmonary nodules throughout all lung lobes, measuring up to 1.0 cm, concerning for metastatic disease.  -- Numerous lytic osseous lesions throughout the axial and appendicular skeleton, consistent with osseous metastatic disease.    CT A/P  --Numerous lytic lesions of the pelvis and proximal femurs, with pathologic fractures of the bilateral sacrum extending into the sacral foramina.     11/2018 -  Presenting Symptoms    Developed right breast soreness and progressive pain pelvic and hip pain.      12/18/2018 Biopsy    Left breast 3:00 core needle biopsy.  Grade 3 ER negative, PR negative, HER-2 negative by IHC invasive ductal carcinoma.     12/21/2018 - 01/02/2019 Radiation    Palliative radiation to 3000 cGy at 300 cGy/fraction for a total of 10 fractions.      01/10/2019 - 01/10/2019 Chemotherapy    OP BREAST ABRAXANE ATEZOLIZUMAB  atezolizumab 840 mg IV on days 1, 15, nab-PACLitaxel 100 mg/m2 IV on days 1, 8, 15, every 28 days until disease progression or DLT         Patient Active Problem List   Diagnosis   ??? Hyperglycemia   ??? Fatigue   ??? Type 2 diabetes mellitus, with long-term current use of insulin (CMS-HCC)   ??? Anemia   ??? Hyponatremia   ??? Bilateral leg edema   ??? Hypokalemia   ??? Hypomagnesemia   ??? Essential hypertension   ??? CKD (chronic kidney disease)   ??? Elevated TSH   ??? Hyperkalemia   ??? (HFpEF) heart failure with preserved ejection fraction (CMS-HCC)   ??? Hypoglycemia   ??? Weakness of both lower extremities   ??? AKI (acute kidney injury) (CMS-HCC)   ??? 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 Malignant neoplasm of lower-inner quadrant of left breast in female, estrogen receptor negative (CMS-HCC)   ??? Non-intractable vomiting with nausea       Past Medical History:   Diagnosis Date   ??? Diabetes mellitus (CMS-HCC)    ??? HLD (hyperlipidemia)    ??? Hypertension    ??? Renal disorder        No past surgical history on file.    Gyn History:     Medications:  Current Outpatient Medications Medication Sig Dispense Refill   ??? acetaminophen (TYLENOL) 325 MG tablet Take 650 mg by mouth every six (6) hours as needed.      ??? amLODIPine (NORVASC) 5 MG tablet Take 2 tablets (10 mg total) by mouth daily.     ???  atorvastatin (LIPITOR) 40 MG tablet Take 40 mg by mouth nightly.      ??? blood sugar diagnostic (ON CALL EXPRESS TEST STRIP) Strp Use to check blood sugar by Other route Four (4) times a day (before meals and nightly). 100 strip 0   ??? carvediloL (COREG) 25 MG tablet Take 1 tablet (25 mg total) by mouth Two (2) times a day. 60 tablet 0   ??? cholecalciferol, vitamin D3, 1,000 unit (25 mcg) tablet Take 2 tablets (2,000 Units total) by mouth daily. 60 tablet 11   ??? cloNIDine HCL (CATAPRES) 0.3 MG tablet Take 0.3 mg by mouth Two (2) times a day.      ??? furosemide (LASIX) 40 MG tablet Take 40 mg by mouth daily. Take additional 40 mg if weight gain >3 lb in 1 day or leg swelling     ??? hydrALAZINE (APRESOLINE) 100 MG tablet Take 1 tablet (100 mg total) by mouth Three (3) times a day. 90 tablet 0   ??? insulin NPH-insulin regular, 70/30, (HUMULIN/NOVOLIN) 100 unit/mL (70-30) injection Inject 0.15 mL (15 Units total) under the skin Two (2) times a day (30 minutes before a meal). Can increase by 5 units if glucose is greater than 140. 9 mL 0   ??? isosorbide mononitrate (IMDUR) 60 MG 24 hr tablet Take 2 tablets (120 mg total) by mouth daily. 180 tablet 0   ??? lisinopriL (PRINIVIL,ZESTRIL) 20 MG tablet Take 20 mg by mouth daily.     ??? melatonin 5 mg Tab Take 5 mg by mouth nightly.     ??? ondansetron (ZOFRAN-ODT) 4 MG disintegrating tablet Take 1 tablet (4 mg total) by mouth every six (6) hours as needed for nausea. 30 tablet 0   ??? oxyCODONE (ROXICODONE) 10 mg immediate release tablet Take 1 tablet (10 mg total) by mouth every four (4) hours as needed for pain for up to 5 days. 30 tablet 0   ??? polyethylene glycol (MIRALAX) 17 gram/dose powder Take 17 g by mouth daily as needed. 255 g 0   ??? senna (SENOKOT) 8.6 mg tablet Take 2 tablets by mouth nightly. 60 tablet 11     No current facility-administered medications for this visit.        Allergies:  No Known Allergies    Family History: Cancer-related family history is not on file.    Social History:   Social History     Social History Narrative    Lives alone in [place].  Never married.  3 children (1 son and 2 daughters).  Son live in [place] and she plans to move to an apartment near him. 23 grandchildren.   Former smoker.  Half [employer] x5-10 years.  Quit in 42s.  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  Associates degree in computer programming and bachelors in hospitality/tourism.       Physical Examination:   Vital Signs: There were no vitals taken for this visit.  General: Healthy-appearing patient, mild distress 2/2 left hip pain  HEENT: PERRTLA, EOMI, OP clear  Cardiovascular: Normal S1 and S2. RRR. No audible murmurs.  Respiratory: Chest clear to percussion and auscultation.   Gastrointestinal: Abdomen soft without masses and tenderness, no hepatosplenomegaly or other masses.   Musculoskeletal: No bony pain or tenderness. ++ pain overlying left hip   Skin: No rash, ecchymoses or purpuric lesions noted.  Breasts: Exam deferred today.  Neurologic:  Alert and oriented. Grossly non-focal  Lymphatic: No cervical, axillary or supraclavicular  adenopathy.   Extremity: No lower extremity edema or upper extremity lymphedema    DATA REVIEW:    Laboratory: I personally reviewed the pertinent laboratory data.    Radiology: I personally reviewed the pertinent imaging.    Pathology: I personally reviewed the pathology report.

## 2019-01-08 NOTE — Unmapped (Signed)
It was a pleasure to see you today in the Medical Oncology Clinic.    Please call our nurse navigator, Modena Jansky, if you have any interval questions or concerns:      Modena Jansky, RN: phone: [phone]     Prescription refills: [phone]     FAX: [phone]   ----------------------------------------------------------------------------------------------------  For health related questions call:   Please call NURSE TRIAGE LINE at [phone]    For appointments & questions Monday through Friday 8 AM???5 PM     Please call [phone] or Toll free [phone]    On Lovenia Kim and Holidays  Call [phone] and ask for the oncologist on call    For emergencies, evenings or weekends, please call [phone] and ask for the oncology fellow on call.     Reasons to call emergency line may include:   Fever of 100.5 or greater   Nausea and/or vomiting not relieved with nausea medicine   Diarrhea or constipation not relieved with bowel regimen   Severe pain not relieved with usual pain regimen     --------------------------------------------------------------------------------------------------------  Given the rapidly spreading coronavirus (COVID-19), we recommended you avoid travel and crowds, and wash your hands frequently.     If you develop fever, cough, shortness of breath and/or have known exposure (close contact < 6 feet) with someone who has tested positive please call the [facility] Coronavirus Helpline at [phone].         Alexandra American, DNP, NP-C  Breast Medical Oncology  [facility]emat[facility]/Oncology  [address]  [place], [place] [address]

## 2019-01-09 DIAGNOSIS — C50919 Malignant neoplasm of unspecified site of unspecified female breast: Principal | ICD-10-CM

## 2019-01-09 DIAGNOSIS — C7951 Secondary malignant neoplasm of bone: Principal | ICD-10-CM

## 2019-01-10 ENCOUNTER — Encounter: Admit: 2019-01-10 | Discharge: 2019-01-10 | Payer: MEDICAID

## 2019-01-10 ENCOUNTER — Encounter: Admit: 2019-01-10 | Discharge: 2019-01-10 | Payer: MEDICAID | Attending: Adult Health | Primary: Adult Health

## 2019-01-10 ENCOUNTER — Ambulatory Visit: Admit: 2019-01-10 | Discharge: 2019-01-10 | Payer: MEDICAID

## 2019-01-10 DIAGNOSIS — C50919 Malignant neoplasm of unspecified site of unspecified female breast: Principal | ICD-10-CM

## 2019-01-10 DIAGNOSIS — C7951 Secondary malignant neoplasm of bone: Principal | ICD-10-CM

## 2019-01-10 LAB — CBC W/ AUTO DIFF
BASOPHILS RELATIVE PERCENT: 0.3 %
EOSINOPHILS ABSOLUTE COUNT: 0.5 10*9/L — ABNORMAL HIGH (ref 0.0–0.4)
HEMOGLOBIN: 9.7 g/dL — ABNORMAL LOW (ref 13.5–16.0)
LARGE UNSTAINED CELLS: 2 % (ref 0–4)
LYMPHOCYTES ABSOLUTE COUNT: 0.5 10*9/L — ABNORMAL LOW (ref 1.5–5.0)
LYMPHOCYTES RELATIVE PERCENT: 6.1 %
MEAN CORPUSCULAR HEMOGLOBIN CONC: 34.5 g/dL (ref 31.0–37.0)
MEAN CORPUSCULAR HEMOGLOBIN: 30.5 pg (ref 26.0–34.0)
MEAN CORPUSCULAR VOLUME: 88.7 fL (ref 80.0–100.0)
MEAN PLATELET VOLUME: 7 fL (ref 7.0–10.0)
MONOCYTES ABSOLUTE COUNT: 0.6 10*9/L (ref 0.2–0.8)
MONOCYTES RELATIVE PERCENT: 8.6 %
NEUTROPHILS ABSOLUTE COUNT: 5.7 10*9/L (ref 2.0–7.5)
NEUTROPHILS RELATIVE PERCENT: 76.3 %
PLATELET COUNT: 250 10*9/L (ref 150–440)
RED BLOOD CELL COUNT: 3.18 10*12/L — ABNORMAL LOW (ref 4.00–5.20)
RED CELL DISTRIBUTION WIDTH: 14.5 % (ref 12.0–15.0)
WBC ADJUSTED: 7.5 10*9/L (ref 4.5–11.0)

## 2019-01-10 LAB — COMPREHENSIVE METABOLIC PANEL
ALBUMIN: 3.3 g/dL — ABNORMAL LOW (ref 3.5–5.0)
ALKALINE PHOSPHATASE: 52 U/L (ref 38–126)
ALT (SGPT): 12 U/L (ref <35)
ANION GAP: 10 mmol/L (ref 7–15)
AST (SGOT): 23 U/L (ref 14–38)
BILIRUBIN TOTAL: 0.4 mg/dL (ref 0.0–1.2)
BLOOD UREA NITROGEN: 25 mg/dL — ABNORMAL HIGH (ref 7–21)
BUN / CREAT RATIO: 14
CALCIUM: 9.3 mg/dL (ref 8.5–10.2)
CHLORIDE: 103 mmol/L (ref 98–107)
CO2: 25 mmol/L (ref 22.0–30.0)
CREATININE: 1.74 mg/dL — ABNORMAL HIGH (ref 0.60–1.00)
EGFR CKD-EPI AA FEMALE: 36 mL/min/{1.73_m2} — ABNORMAL LOW (ref >=60)
EGFR CKD-EPI NON-AA FEMALE: 31 mL/min/{1.73_m2} — ABNORMAL LOW (ref >=60)
GLUCOSE RANDOM: 71 mg/dL (ref 70–179)
POTASSIUM: 3.8 mmol/L (ref 3.5–5.0)
SODIUM: 138 mmol/L (ref 135–145)

## 2019-01-10 LAB — LARGE UNSTAINED CELLS: Lab: 2

## 2019-01-10 LAB — CARCINOEMBRYONIC ANTIGEN: Carcinoembryonic Ag:MCnc:Pt:Ser/Plas:Qn:: 1.6

## 2019-01-10 LAB — PROTEIN TOTAL: Protein:MCnc:Pt:Ser/Plas:Qn:: 6.4 — ABNORMAL LOW

## 2019-01-10 MED ORDER — OXYCODONE 10 MG TABLET: 10 mg | tablet | Freq: Four times a day (QID) | 0 refills | 23 days | Status: AC

## 2019-01-10 MED ORDER — OXYCODONE 10 MG TABLET: 10 mg | tablet | Freq: Four times a day (QID) | 0 refills | 30 days | Status: AC

## 2019-01-10 MED ORDER — OXYCODONE 10 MG TABLET: 10 mg | tablet | Freq: Four times a day (QID) | 0 refills | 30 days

## 2019-01-10 MED ORDER — CAPECITABINE 500 MG TABLET: 1500 mg | tablet | Freq: Two times a day (BID) | 1 refills | 14 days | Status: AC

## 2019-01-10 MED ORDER — PROCHLORPERAZINE MALEATE 10 MG TABLET: 10 mg | tablet | Freq: Four times a day (QID) | 2 refills | 8 days | Status: AC

## 2019-01-10 NOTE — Unmapped (Signed)
[facility] Medication Referral: No PA required    Medication (Brand/Generic): Capecitabine 500 mg tablets    Initial Benefits Investigation Claim completed with resulted information below:  No PA required  Patient ABLE to fill at [facility]  Insurance Company:  [employer] PAP  Anticipated Copay: $0.00    As Co-pay is under $25 defined limit, per policy there will be no further investigation of need for financial assistance at this time unless patient requests. This referral has been communicated to the provider and handed off to the [facility] team for further processing and filling of prescribed medication.   ______________________________________________________________________  Please utilize this referral for viewing purposes as it will serve as the central location for all relevant documentation and updates.

## 2019-01-10 NOTE — Unmapped (Signed)
C/o right hip pain radiating down leg    Is requesting pain medication.

## 2019-01-10 NOTE — Unmapped (Signed)
[facility]   Patient Onboarding/Medication Counseling    Alexandra Richards is a 58 y.o. female with breast cancer who I am counseling today on initiation of therapy.  I am speaking to the patient.    Verified patient's date of birth / HIPAA.    Specialty medication(s) to be sent: Hematology/Oncology: Capecitabine 500mg , directions: 1500mg  twice daily for 14 days on and 7 days off      Non-specialty medications/supplies to be sent: n/a      Medications not needed at this time: n/a         Xeloda (capecitabine)    Medication & Administration     Dosage: 1500mg  (3 tablets) two times a day for 14 days on and 7 days off    Administration: I reviewed the importance of taking with a full glass of water within 30 minutes of a meal (at least 1 cup of food). Discussed that tablet should be swallowed whole and cannot be crushed or chewed.    Adherence/Missed dose instruction: If a dose is missed, do not take an extra dose or two doses at one time. Simply take your next dose at the regularly scheduled time and record any missed doses so our team is aware.    Goals of Therapy     Prevent disease progression    Side Effects & Monitoring Parameters     ? Nausea/vomiting  ? Diarrhea/constipation  ? Infection precautions  ? Fatigue  ? Mouth sores or irritation  ? Hand/foot syndrome (tingling, numbness, pain, redness, peeling or blistering) Use Urea 20% cream, Aquaphor, Eucerin, Cetaphil, or CeraVe twice daily on hands and feet as preventative  ? Sun precautions  ? Decrease appetite/ taste changes  ? Bleeding precautions (bruising easily, nose bleeds, gums bleed)  ? Headache  ? Dry skin  ? Hair loss    The following side effects should be reported to the provider:  ? Diarrhea not controlled by anti-diarrheals  ? Swelling, warmth, numbness, change of color or pain in a leg or arm  ? Signs of infection (fever >100.4, chills, sore throat, sputum production) ? Signs of liver problems (dark urine, abdominal pain, light-colored stools, vomiting, yellow skin or eyes)  ? Signs of bleeding (vomiting or coughing up blood, blood that looks like coffee grounds, blood in the urine or black, red tarry stools, bruising that gets bigger without reason, any persistent or severe bleeding)  ? Skin rash or signs of skin infection (cracking, peeling, blistering, bleeding skin, red or irritated eyes)  ? Signs of fluid and electrolyte problems (mood changes, confusion, abnormal/fast  heartbeat, severe dizziness, passing out, increased thirst, seizures, loss of strength and energy, lack of appetite, unable to pass urine or change in amount of urine passed, dry mouth, dry eyes, or nausea or vomiting.  ? Signs of hand foot syndrome (redness or irritation on the palms of the hands or soles of the feet)  ? Signs of mucositis (red or swollen mouth/gums, sores in the mouth, gums or tongue, soreness or pain in the mouth or throat, difficulty swallowing or talking, dryness, mild burning, or pain when eating food white patches or pus in the mouth or on the tongue, increased mucus or thicker saliva)  ? Signs of anaphylaxis (wheezing, chest tightness, swelling of face, lips, tongue or throat)    Monitoring parameters: Renal function should be estimated at baseline to determine initial dose. During therapy, CBC with differential, hepatic function, and renal function should be monitored. Monitor INR  closely if receiving concomitant warfarin. Pregnancy test prior to treatment initiation (in females of reproductive potential). Monitor for diarrhea, dehydration, hand-foot syndrome, Stevens-Johnson syndrome, toxic epidermal necrolysis, stomatitis, and cardiotoxicity. Monitor adherence.    Contraindications, Warnings & Precautions [place] Boxed Warning: Capecitabine may increase the anticoagulant effects of warfarin; bleeding events, including death, have occurred with concomitant use. Clinically significant increases in prothrombin time (PT) and INR have occurred within several days to months after capecitabine initiation (in patients previously stabilized on anticoagulants), and may continue up to 1 month after capecitabine discontinuation. May occur in patients with or without liver metastases. Monitor PT and INR frequently and adjust anticoagulation dosing accordingly. An increased risk of coagulopathy is correlated with a cancer diagnosis and age >60 years.    Contraindications:  ? Known hypersensitivity to capecitabine, fluorouracil, or any component of the formulation;   ? Severe renal impairment (CrCl <30 mL/minute)    Warnings and Precautions:  ? Bone marrow suppression  ? Cardiotoxicity: Myocardial infarction, ischemia, angina, dysrhythmias, cardiac arrest, cardiac failure, sudden death, ECG changes, and cardiomyopathy  ? Mouth sores-discussed use of baking soda/salt water rinses  ? Dermatologic toxicity: Stevens-Johnson syndrome and toxic epidermal necrolysis   ? Hand/foot prevention (moisturizers, luke warm hand washes, patting hands/feet dry, decrease rubbing on hands/feet)  ? GI toxicity: May cause diarrhea (may be severe); Importance of good nutrition to help minimize diarrhea (high protein, BRAT, yogurt, avoid greasy and spicy foods).  Importance of hydration if having frequent diarrhea ? Reproductive concerns: Evaluate pregnancy status prior to therapy in females of reproductive potential. Females of reproductive potential should use effective contraception during treatment and for 6 months after the last dose. Males with female partners of reproductive potential should use effective contraception during treatment and for 3 months after the last dose. Based on the mechanism of action and data from animal reproduction studies, in utero exposure to capecitabine may cause fetal harm.   ? Breast feeding considerations: It is not known if capecitabine is present in breast milk. Due to the potential for serious adverse reactions in the breastfed infant, breastfeeding is not recommended by the manufacturer during treatment and for 2 weeks after the last dose.    Drug/Food Interactions     ? Medication list reviewed in Epic. The patient was instructed to inform the care team before taking any new medications or supplements. No drug interactions identified.   ? Avoid live vaccines.    Storage, Handling Precautions, & Disposal     ? This medication should be stored at room temperature and in a dry location. Keep out of reach of others including children and pets. Keep the medicine in the original container with a child-proof top (no pillboxes). Do not throw away or flush unused medication down the toilet or sink. This drug is considered hazardous and should be handled as little as possible.  If someone else helps with medication administration, they should wear gloves.      Current Medications (including OTC/herbals), Comorbidities and Allergies     Current Outpatient Medications   Medication Sig Dispense Refill   ??? acetaminophen (TYLENOL) 325 MG tablet Take 650 mg by mouth every six (6) hours as needed.      ??? amLODIPine (NORVASC) 5 MG tablet Take 2 tablets (10 mg total) by mouth daily.     ??? atorvastatin (LIPITOR) 40 MG tablet Take 40 mg by mouth nightly. ??? blood sugar diagnostic (ON CALL EXPRESS TEST STRIP) Strp Use to check blood sugar by Other  Richards Four (4) times a day (before meals and nightly). 100 strip 0   ??? capecitabine (XELODA) 500 MG tablet Take 3 tablets (1,500 mg total) by mouth Two (2) times a day . For 14 days on then 7 days off 84 tablet 1   ??? carvediloL (COREG) 25 MG tablet Take 1 tablet (25 mg total) by mouth Two (2) times a day. 60 tablet 0   ??? cholecalciferol, vitamin D3, 1,000 unit (25 mcg) tablet Take 2 tablets (2,000 Units total) by mouth daily. 60 tablet 11   ??? cloNIDine HCL (CATAPRES) 0.3 MG tablet Take 0.3 mg by mouth Two (2) times a day.      ??? furosemide (LASIX) 40 MG tablet Take 40 mg by mouth daily. Take additional 40 mg if weight gain >3 lb in 1 day or leg swelling     ??? hydrALAZINE (APRESOLINE) 100 MG tablet Take 1 tablet (100 mg total) by mouth Three (3) times a day. 90 tablet 0   ??? insulin NPH-insulin regular, 70/30, (HUMULIN/NOVOLIN) 100 unit/mL (70-30) injection Inject 0.15 mL (15 Units total) under the skin Two (2) times a day (30 minutes before a meal). Can increase by 5 units if glucose is greater than 140. 9 mL 0   ??? isosorbide mononitrate (IMDUR) 60 MG 24 hr tablet Take 2 tablets (120 mg total) by mouth daily. 180 tablet 0   ??? lisinopriL (PRINIVIL,ZESTRIL) 20 MG tablet Take 20 mg by mouth daily.     ??? melatonin 5 mg Tab Take 5 mg by mouth nightly.     ??? ondansetron (ZOFRAN-ODT) 4 MG disintegrating tablet Take 1 tablet (4 mg total) by mouth every six (6) hours as needed for nausea. 30 tablet 0   ??? polyethylene glycol (MIRALAX) 17 gram/dose powder Take 17 g by mouth daily as needed. 255 g 0   ??? prochlorperazine (COMPAZINE) 10 MG tablet Take 1 tablet (10 mg total) by mouth every six (6) hours as needed for nausea. 30 tablet 2   ??? senna (SENOKOT) 8.6 mg tablet Take 2 tablets by mouth nightly. 60 tablet 11     No current facility-administered medications for this visit.        No Known Allergies    Patient Active Problem List Diagnosis   ??? Hyperglycemia   ??? Fatigue   ??? Type 2 diabetes mellitus, with long-term current use of insulin (CMS-HCC)   ??? Anemia   ??? Hyponatremia   ??? Bilateral leg edema   ??? Hypokalemia   ??? Hypomagnesemia   ??? Essential hypertension   ??? CKD (chronic kidney disease)   ??? Elevated TSH   ??? Hyperkalemia   ??? (HFpEF) heart failure with preserved ejection fraction (CMS-HCC)   ??? Hypoglycemia   ??? Weakness of both lower extremities   ??? AKI (acute kidney injury) (CMS-HCC)   ??? 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 Malignant neoplasm of lower-inner quadrant of left breast in female, estrogen receptor negative (CMS-HCC)   ??? Non-intractable vomiting with nausea       Reviewed and up to date in Epic.    Appropriateness of Therapy     Is medication and dose appropriate based on diagnosis? Yes    Baseline Quality of Life Assessment      How many days over the past month did your condition keep you from your normal activities? 0    Financial Information  Medication Assistance provided: None Required    Anticipated copay of $0 reviewed with patient. Verified delivery address.    Delivery Information     Scheduled delivery date: 01/11/19    Expected start date: 01/11/19    Medication will be delivered via Same Day Courier to the home address in [place].  This shipment will not require a signature.      Explained the services we provide at [facility] and that each month we would call to set up refills.  Stressed importance of returning phone calls so that we could ensure they receive their medications in time each month.  Informed patient that we should be setting up refills 7-10 days prior to when they will run out of medication.  A pharmacist will reach out to perform a clinical assessment periodically.  Informed patient that a welcome packet and a drug information handout will be sent. Patient verbalized understanding of the above information as well as how to contact the pharmacy at [phone] option 4 with any questions/concerns.  The pharmacy is open Monday through Friday 8:30am-4:30pm.  A pharmacist is available 24/7 via pager to answer any clinical questions they may have.    Patient Specific Needs     ? Does the patient have any physical, cognitive, or cultural barriers? No    ? Patient prefers to have medications discussed with  Patient     ? Is the patient able to read and understand education materials at a high school level or above? Yes    ? Patient's primary language is  English     ? Is the patient high risk? No     ? Does the patient require a Care Management Plan? No     ? Does the patient require physician intervention or other additional services (i.e. nutrition, smoking cessation, social work)? No      Alexandra Richards [facility] Specialty Pharmacist

## 2019-01-10 NOTE — Unmapped (Signed)
Hi,     [facility] contacted the Communication [facility] regarding the following:    - States they don't carry the Oxycodone that was requested. The patient would [facility] [place].     Please contact [facility] at [phone] for proper follow up. Pt is requesting to use [address], [place], [place] [address]  Ph. [phone]        Thanks in advance,    Drema Balzarine  [facility]   [phone]

## 2019-01-10 NOTE — Unmapped (Signed)
Addended by: Carlos American A on: 01/10/2019 03:35 PM     Modules accepted: Orders

## 2019-01-11 MED FILL — CAPECITABINE 500 MG TABLET: ORAL | 21 days supply | Qty: 84 | Fill #0

## 2019-01-11 MED FILL — CAPECITABINE 500 MG TABLET: 21 days supply | Qty: 84 | Fill #0 | Status: AC

## 2019-01-11 NOTE — Unmapped (Signed)
Pharmacist Oral Chemotherapy Education    Medication: Capecitabine    Medication Education     Oral chemotherapy regimen: Capecitabine 1500 mg PO BID  Tentative Start Date: 01/12/19  Pharmacy: [facility]    Medication indication, administration twice daily after a meal, regimen schedule, and oral chemotherapy handling precautions were reviewed with the patient.  Side effects discussed included but were not limited to: nausea/vomiting, complications of myelosuppression, mucositis, diarrhea, hand/foot syndrome, and fatigue.  Patient was instructed to apply lotion to hands and feet twice daily to reduce risk of this adverse effect.  Avoidance of excessive friction/rubbing of hands and feet as well as wearing good-fitting, comfortable shoes can also reduce irritation.  I also discussed the use of Imodium to treat diarrhea,  good oral care with  salt water rinses to reduce the risk of mouth sores, and use of anti-emetics to treat nausea and vomiting.     Patient is not sure if her PAP documents were delivered to the pharmacy. Informed patient that if more signatures are needed, someone from the MAP will be contacting patient.     Drug interactions: none identified     Follow up: will follow up patient ~ 2 weeks once patient starts regimen    Patient verbalized understanding of the above information as well as how to contact the Team with any questions/concerns.    Approximate time with patient: 20 minutes    Ezekiel Ina, PharmD  PGY2 Oncology Pharmacy Resident   Pager ID: [phone]    I agree with the information and plan as documented by the resident.    Raelene Bott, PharmD, BCOP, CPP  Clinical Pharmacist Practitioner, Breast Oncology  Pager: [phone]

## 2019-01-11 NOTE — Unmapped (Signed)
VS taken during clinic visit today.    Labs completed via phlebotomy/lab.     Pt received Xgeva injection in right lower abdomen - pt tolerated well.    AVS received at scheduling.    Pt stable and assisted from clinic via wheelchair by family.    Pt applying for charity care. Pt signed medication waiver for receive Xgeva today. Pt aware of possible out-of-pocket cost.

## 2019-01-12 LAB — CA 27-29: Lab: 10.8

## 2019-01-21 NOTE — Unmapped (Signed)
Clinical Pharmacist Practitioner: Breast Clinic    Oral Chemotherapy Follow-Up    Assessment and recommendations:  Patient is adherent and tolerating regimen well. Continue to take Capecitabine 1500 mg (3 tablets) PO BID.     Follow- up: 11/05 with Corrine Gaynell Face, AGNP  ______________________________________________________________________    Oral Chemotherapy: Capecitabine 1500 mg PO BID x 14 days on and 7 days off  Pharmacy:  [facility]       Start date: 01/12/19    HPI: Alexandra Richards is a 58 y.o. 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 Patient is PDL-1 negative and has started capecitabine.    Interim History: Patient is tolerating regimen very well so far. She reports some minor fatigue but that this has not changed since starting capecitabine. She endorses no new side effects or changes since starting her regimen such as nausea, vomiting, diarrhea, hand foot syndrome, and mucositis.     Adherence: Patient is adherent to regimen is taking it correctly after her meals. She reports no missed doses since starting capecitabine. No other recent medication changes.       Toxicities: None - patient is tolerating regimen well    Drug-Drug Interactions:  None idenfitied    Other issues discussed: None    Medications:  Current Outpatient Medications   Medication Sig Dispense Refill   ??? acetaminophen (TYLENOL) 325 MG tablet Take 650 mg by mouth every six (6) hours as needed.      ??? amLODIPine (NORVASC) 5 MG tablet Take 2 tablets (10 mg total) by mouth daily.     ??? atorvastatin (LIPITOR) 40 MG tablet Take 40 mg by mouth nightly.      ??? blood sugar diagnostic (ON CALL EXPRESS TEST STRIP) Strp Use to check blood sugar by Other route Four (4) times a day (before meals and nightly). 100 strip 0   ??? capecitabine (XELODA) 500 MG tablet Take 3 tablets (1,500 mg total) by mouth Two (2) times a day . For 14 days on then 7 days off 84 tablet 1 ??? carvediloL (COREG) 25 MG tablet Take 1 tablet (25 mg total) by mouth Two (2) times a day. 60 tablet 0   ??? cholecalciferol, vitamin D3, 1,000 unit (25 mcg) tablet Take 2 tablets (2,000 Units total) by mouth daily. 60 tablet 11   ??? cloNIDine HCL (CATAPRES) 0.3 MG tablet Take 0.3 mg by mouth Two (2) times a day.      ??? furosemide (LASIX) 40 MG tablet Take 40 mg by mouth daily. Take additional 40 mg if weight gain >3 lb in 1 day or leg swelling     ??? hydrALAZINE (APRESOLINE) 100 MG tablet Take 1 tablet (100 mg total) by mouth Three (3) times a day. 90 tablet 0   ??? insulin NPH-insulin regular, 70/30, (HUMULIN/NOVOLIN) 100 unit/mL (70-30) injection Inject 0.15 mL (15 Units total) under the skin Two (2) times a day (30 minutes before a meal). Can increase by 5 units if glucose is greater than 140. 9 mL 0   ??? isosorbide mononitrate (IMDUR) 60 MG 24 hr tablet Take 2 tablets (120 mg total) by mouth daily. 180 tablet 0   ??? lisinopriL (PRINIVIL,ZESTRIL) 20 MG tablet Take 20 mg by mouth daily.     ??? melatonin 5 mg Tab Take 5 mg by mouth nightly.     ??? ondansetron (ZOFRAN-ODT) 4 MG disintegrating tablet Take 1 tablet (4 mg total) by mouth every six (6)  hours as needed for nausea. 30 tablet 0   ??? oxyCODONE (ROXICODONE) 10 mg immediate release tablet Take 1 tablet (10 mg total) by mouth every six (6) hours as needed for pain. 90 tablet 0   ??? polyethylene glycol (MIRALAX) 17 gram/dose powder Take 17 g by mouth daily as needed. 255 g 0   ??? prochlorperazine (COMPAZINE) 10 MG tablet Take 1 tablet (10 mg total) by mouth every six (6) hours as needed for nausea. 30 tablet 2   ??? senna (SENOKOT) 8.6 mg tablet Take 2 tablets by mouth nightly. 60 tablet 11     No current facility-administered medications for this visit.        Approximate time spent on the phone with patient:  10 minutes    Ezekiel Ina, PharmD  PGY2 Oncology Pharmacy Resident   Pager ID: [phone]    I agree with the information and plan as documented by the resident. Raelene Bott, PharmD, BCOP, CPP  Clinical Pharmacist Practitioner, Breast Oncology  Pager: [phone]

## 2019-01-23 ENCOUNTER — Encounter: Admit: 2019-01-23 | Discharge: 2019-01-26 | Payer: MEDICAID

## 2019-01-23 ENCOUNTER — Ambulatory Visit: Admit: 2019-01-23 | Discharge: 2019-01-26 | Payer: MEDICAID

## 2019-01-23 LAB — SODIUM WHOLE BLOOD: Sodium:SCnc:Pt:Bld:Qn:: 134 — ABNORMAL LOW

## 2019-01-23 LAB — BLOOD GAS CRITICAL CARE PANEL, VENOUS
BASE EXCESS VENOUS: -3 — ABNORMAL LOW (ref -2.0–2.0)
CALCIUM IONIZED VENOUS (MG/DL): 4.36 mg/dL — ABNORMAL LOW (ref 4.40–5.40)
GLUCOSE WHOLE BLOOD: 146 mg/dL
HEMOGLOBIN BLOOD GAS: 9.6 g/dL — ABNORMAL LOW (ref 12.00–16.00)
LACTATE BLOOD VENOUS: 0.7 mmol/L (ref 0.5–1.8)
O2 SATURATION VENOUS: 84.9 % (ref 40.0–85.0)
PCO2 VENOUS: 31 mmHg — ABNORMAL LOW (ref 40–60)
PH VENOUS: 7.44 — ABNORMAL HIGH (ref 7.32–7.43)
POTASSIUM WHOLE BLOOD: 3.6 mmol/L (ref 3.4–4.6)
SODIUM WHOLE BLOOD: 134 mmol/L — ABNORMAL LOW (ref 135–145)

## 2019-01-23 LAB — COMPREHENSIVE METABOLIC PANEL
ALBUMIN: 3.4 g/dL — ABNORMAL LOW (ref 3.5–5.0)
ALKALINE PHOSPHATASE: 64 U/L (ref 38–126)
ALT (SGPT): 10 U/L (ref <35)
ANION GAP: 12 mmol/L (ref 7–15)
AST (SGOT): 21 U/L (ref 14–38)
BILIRUBIN TOTAL: 0.5 mg/dL (ref 0.0–1.2)
BLOOD UREA NITROGEN: 24 mg/dL — ABNORMAL HIGH (ref 7–21)
BUN / CREAT RATIO: 15
CHLORIDE: 103 mmol/L (ref 98–107)
CO2: 19 mmol/L — ABNORMAL LOW (ref 22.0–30.0)
CREATININE: 1.6 mg/dL — ABNORMAL HIGH (ref 0.60–1.00)
EGFR CKD-EPI AA FEMALE: 40 mL/min/{1.73_m2} — ABNORMAL LOW (ref >=60)
EGFR CKD-EPI NON-AA FEMALE: 35 mL/min/{1.73_m2} — ABNORMAL LOW (ref >=60)
POTASSIUM: 4.1 mmol/L (ref 3.5–5.0)
PROTEIN TOTAL: 6.5 g/dL (ref 6.5–8.3)
SODIUM: 134 mmol/L — ABNORMAL LOW (ref 135–145)

## 2019-01-23 LAB — CBC W/ AUTO DIFF
BASOPHILS RELATIVE PERCENT: 0.3 %
EOSINOPHILS ABSOLUTE COUNT: 0.1 10*9/L (ref 0.0–0.4)
EOSINOPHILS RELATIVE PERCENT: 1.1 %
HEMATOCRIT: 27.7 % — ABNORMAL LOW (ref 36.0–46.0)
LARGE UNSTAINED CELLS: 2 % (ref 0–4)
LYMPHOCYTES ABSOLUTE COUNT: 0.6 10*9/L — ABNORMAL LOW (ref 1.5–5.0)
LYMPHOCYTES RELATIVE PERCENT: 7.2 %
MEAN CORPUSCULAR HEMOGLOBIN: 30.7 pg (ref 26.0–34.0)
MEAN CORPUSCULAR VOLUME: 89.5 fL (ref 80.0–100.0)
MEAN PLATELET VOLUME: 6.6 fL — ABNORMAL LOW (ref 7.0–10.0)
MONOCYTES ABSOLUTE COUNT: 0.8 10*9/L (ref 0.2–0.8)
MONOCYTES RELATIVE PERCENT: 10.3 %
NEUTROPHILS ABSOLUTE COUNT: 6.3 10*9/L (ref 2.0–7.5)
NEUTROPHILS RELATIVE PERCENT: 78.7 %
PLATELET COUNT: 628 10*9/L — ABNORMAL HIGH (ref 150–440)
RED BLOOD CELL COUNT: 3.09 10*12/L — ABNORMAL LOW (ref 4.00–5.20)
RED CELL DISTRIBUTION WIDTH: 16.5 % — ABNORMAL HIGH (ref 12.0–15.0)
WBC ADJUSTED: 8 10*9/L (ref 4.5–11.0)

## 2019-01-23 LAB — ALT (SGPT): Alanine aminotransferase:CCnc:Pt:Ser/Plas:Qn:: 10

## 2019-01-23 LAB — LIPASE: Triacylglycerol lipase:CCnc:Pt:Ser/Plas:Qn:: 45

## 2019-01-23 LAB — HEMOGLOBIN: Hemoglobin:MCnc:Pt:Bld:Qn:: 9.5 — ABNORMAL LOW

## 2019-01-24 NOTE — Unmapped (Signed)
[facility]  Emergency Department Provider Note     ED Clinical Impression      Final diagnoses:   Colitis (Primary)   Non-intractable vomiting with nausea, unspecified vomiting type           Impression, ED Course, Assessment and Plan      Impression: Alexandra Richards is a 58 y.o. 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     Patient is chronically ill appearing, VS WNL, afebrile and in NAD. On exam, mild diffuse tenderness, abdomen soft. Bowel sounds present.     DDx includes colitis vs gastroenteritis vs diverticulitis vs pancreatitis vs ileus vs obstruction. Plan for basic labs, lipase, VBG, CT A/P, IV fluids, Zofran, and morphine.     10:01 PM CT A/P consistent with colitis. Moderate left pleural effusion and atelectasis. BUN and creatinine 24 and 1.6, consistent with her baseline ~ 25 and 1.7. No signs of anemia. No leukocytosis.     10:26 PM On reevaluation, patient is not feeling improved. Remains nauseous. Given colitis seen on CT with persistent symptoms, will plan for admission. MAO paged for admission.        Additional Medical Decision Making     I have reviewed the vital signs and the nursing notes. Labs and radiology results that were available during my care of the patient were independently reviewed by me and considered in my medical decision making.     I independently visualized the radiology images.   I discussed the case and plan for continuity of care with the admitting provider.       Portions of this record have been created using Scientist, clinical (histocompatibility and immunogenetics). Dictation errors have been sought, but may not have been identified and corrected.  ____________________________________________         History        Chief Complaint  Abdominal Pain      HPI Alexandra Richards is a 58 y.o. female with PMH of T2DM, HTN, HLD, CKD, HFpEF, breast cancer with metastases to bone (on chemotherapy), and anemia presenting with nausea, vomiting, and cramping lower abdominal pain since yesterday. She has been able to tolerate only minimal PO intake, some water about an hour PTA. Her pain is worsened with lying for a prolonged time. She has not had a BM in over 1 week. She has been taking stool softeners without relief. She is passing flatus. She also reports diaphoresis, chills,and occasional shortness of breath. No hx of abdominal surgeries. No known drug allergies. Denies hematuria.    She was admitted here for similar symptoms 01/03/19-01/05/19 at which time her symptoms were controlled with IV anti-emetics (Zofran, Phenergan, Reglan, compazine) and was discharged on scheduled Zofran 4 mg Q6.     Past Medical History:   Diagnosis Date   ??? Diabetes mellitus (CMS-HCC)    ??? HLD (hyperlipidemia)    ??? Hypertension    ??? Renal disorder        Patient Active Problem List   Diagnosis   ??? Hyperglycemia   ??? Fatigue   ??? Type 2 diabetes mellitus, with long-term current use of insulin (CMS-HCC)   ??? Anemia   ??? Hyponatremia   ??? Bilateral leg edema   ??? Hypokalemia   ??? Hypomagnesemia   ??? Essential hypertension   ???  CKD (chronic kidney disease)   ??? Elevated TSH   ??? Hyperkalemia   ??? (HFpEF) heart failure with preserved ejection fraction (CMS-HCC)   ??? Hypoglycemia   ??? Weakness of both lower extremities   ??? AKI (acute kidney injury) (CMS-HCC)   ??? 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 Malignant neoplasm of lower-inner quadrant of left breast in female, estrogen receptor negative (CMS-HCC)   ??? Non-intractable vomiting with nausea       No past surgical history on file.    No current facility-administered medications for this encounter.     Current Outpatient Medications: ???  acetaminophen (TYLENOL) 325 MG tablet, Take 650 mg by mouth every six (6) hours as needed. , Disp: , Rfl:   ???  amLODIPine (NORVASC) 5 MG tablet, Take 2 tablets (10 mg total) by mouth daily., Disp: , Rfl:   ???  atorvastatin (LIPITOR) 40 MG tablet, Take 40 mg by mouth nightly. , Disp: , Rfl:   ???  blood sugar diagnostic (ON CALL EXPRESS TEST STRIP) Strp, Use to check blood sugar by Other route Four (4) times a day (before meals and nightly)., Disp: 100 strip, Rfl: 0  ???  capecitabine (XELODA) 500 MG tablet, Take 3 tablets (1,500 mg total) by mouth Two (2) times a day . For 14 days on then 7 days off, Disp: 84 tablet, Rfl: 1  ???  carvediloL (COREG) 25 MG tablet, Take 1 tablet (25 mg total) by mouth Two (2) times a day., Disp: 60 tablet, Rfl: 0  ???  cholecalciferol, vitamin D3, 1,000 unit (25 mcg) tablet, Take 2 tablets (2,000 Units total) by mouth daily., Disp: 60 tablet, Rfl: 11  ???  cloNIDine HCL (CATAPRES) 0.3 MG tablet, Take 0.3 mg by mouth Two (2) times a day. , Disp: , Rfl:   ???  furosemide (LASIX) 40 MG tablet, Take 40 mg by mouth daily. Take additional 40 mg if weight gain >3 lb in 1 day or leg swelling, Disp: , Rfl:   ???  hydrALAZINE (APRESOLINE) 100 MG tablet, Take 1 tablet (100 mg total) by mouth Three (3) times a day., Disp: 90 tablet, Rfl: 0  ???  insulin NPH-insulin regular, 70/30, (HUMULIN/NOVOLIN) 100 unit/mL (70-30) injection, Inject 0.15 mL (15 Units total) under the skin Two (2) times a day (30 minutes before a meal). Can increase by 5 units if glucose is greater than 140., Disp: 9 mL, Rfl: 0  ???  isosorbide mononitrate (IMDUR) 60 MG 24 hr tablet, Take 2 tablets (120 mg total) by mouth daily., Disp: 180 tablet, Rfl: 0  ???  lisinopriL (PRINIVIL,ZESTRIL) 20 MG tablet, Take 20 mg by mouth daily., Disp: , Rfl:   ???  melatonin 5 mg Tab, Take 5 mg by mouth nightly., Disp: , Rfl: ???  ondansetron (ZOFRAN-ODT) 4 MG disintegrating tablet, Take 1 tablet (4 mg total) by mouth every six (6) hours as needed for nausea., Disp: 30 tablet, Rfl: 0  ???  oxyCODONE (ROXICODONE) 10 mg immediate release tablet, Take 1 tablet (10 mg total) by mouth every six (6) hours as needed for pain., Disp: 90 tablet, Rfl: 0  ???  polyethylene glycol (MIRALAX) 17 gram/dose powder, Take 17 g by mouth daily as needed., Disp: 255 g, Rfl: 0  ???  prochlorperazine (COMPAZINE) 10 MG tablet, Take 1 tablet (10 mg total) by mouth every six (6) hours as needed for nausea., Disp: 30 tablet, Rfl: 2  ???  senna (SENOKOT) 8.6 mg tablet, Take 2 tablets by mouth nightly., Disp: 60 tablet, Rfl: 11    Allergies  Patient has no known allergies.    History reviewed. No pertinent family history.    Social History  Social History     Tobacco Use   ??? Smoking status: Former Smoker     Types: Cigarettes   ??? Smokeless tobacco: Never Used   Substance Use Topics   ??? Alcohol use: No     Alcohol/week: 0.0 standard drinks   ??? Drug use: Yes     Types: Marijuana     Comment: Last use in April 2020       Review of Systems  Constitutional: As per HPI  Eyes: Negative for visual changes.  ENT: Negative for sore throat.  Cardiovascular: Negative for chest pain.  Respiratory: As per HPI  Gastrointestinal: As per HPI  Genitourinary: Negative for dysuria.   Musculoskeletal: Negative for back pain.  Skin: Negative for rash.  Neurological: Negative for headaches, focal weakness or numbness.       Physical Exam     ED Triage Vitals [01/23/19 1945]   Enc Vitals Group      BP 141/65      Heart Rate 92      SpO2 Pulse       Resp 17      Temp 37.4 ??C (99.3 ??F)      Temp src       SpO2 99 %     Constitutional: Alert and oriented. Appears uncomfortable but nontoxic.   Eyes: Conjunctivae are normal.  ENT       Head: Normocephalic and atraumatic.       Nose: No congestion.       Mouth/Throat: Mucous membranes are moist.       Neck: No stridor. Hematological/Lymphatic/Immunilogical: No cervical lymphadenopathy.  Cardiovascular: Normal rate, regular rhythm. Normal and symmetric distal pulses are present in all extremities.  Respiratory: Normal respiratory effort. Breath sounds are normal.  Gastrointestinal: Mild diffuse tenderness, abdomen soft. BS present.  Musculoskeletal: Normal range of motion in all extremities.       Right lower leg: No tenderness or edema.       Left lower leg: No tenderness or edema.  Neurologic: Normal speech and language. No gross focal neurologic deficits are appreciated.  Skin: Skin is warm, dry and intact. No rash noted.  Psychiatric: Mood and affect are normal. Speech and behavior are normal.    Radiology     CT Abdomen Pelvis with IV Contrast ONLY   Final Result      -Mild wall thickening of the sigmoid colon with minimal surrounding stranding which may represent colitis.      -Unchanged scattered lytic lesions within the visualized spine, pelvis and femurs and multiple right upper lobe pulmonary nodules consistent with metastatic disease.      - Moderate left pleural effusion with associated atelectasis, increased from prior.        Documentation assistance was provided by Leighton Ruff, Scribe, on January 23, 2019 at 8:07 PM Shaune Leeks, MD.     January 25, 2019 3:57 PM. Documentation assistance provided by the scribe. I was present during the time the encounter was recorded. The information recorded by the scribe was done at my direction and has been reviewed and validated by me.        Sherryl Barters, MD  01/25/19 [phone]

## 2019-01-24 NOTE — Unmapped (Signed)
Your tele/video hospital follow up appointment will be on Monday 11/9 at 10:40 am with Dr. Illene Regulus ( no availability with Dr. Su Hilt)   phone: [phone]

## 2019-01-24 NOTE — Unmapped (Signed)
Problem: Adult Inpatient Plan of Care  Goal: Plan of Care Review  Outcome: Progressing  Goal: Patient-Specific Goal (Individualization)  Outcome: Progressing  Goal: Absence of Hospital-Acquired Illness or Injury  Outcome: Progressing  Goal: Optimal Comfort and Wellbeing  Outcome: Progressing  Goal: Readiness for Transition of Care  Outcome: Progressing  Goal: Rounds/Family Conference  Outcome: Progressing   Admitted from ED with abd pain. A/O. No c/o pain. Stretcher to bed transfer. Oriented to room and call light system.

## 2019-01-24 NOTE — Unmapped (Signed)
Family Medicine Inpatient Service    Progress Note    Team: Family Medicine [place] [facility] (pgr [phone])    Hospital Day: 0    ASSESSMENT / PLAN:   Nyhla Richards is a 58 y.o. female with HTN, T2DM, CKD, HFpEF, metastatic breast cancer (on oral chemo) who presents with nausea and vomiting; and constipation; Nausea has improved somewhat but has not had a BM in 10 days.       Nausea and vomiting - Constipation - Abdominal Pain:  Nausea overall improving; able to tolerate breakfast; has not had a BM in 10 days; also reported poor appetite lately and becoming nauseated from scent of food.     - plan for suppository; and increase miralax to TID;  - zofran q8h PRN    ??  # Metastatic Breast Cancer on oral chemotherapy - Pathologic fracture:  Plan to hold oral chemo for 1 day while addressing constipation and nausea. Started oral capecitabine 1500mg  PO BID on 10/17.  She will taken 14 days on and 7 days off.     - scheduling tylenol 1 gram q8   - continue home oxycodone 10mg  q6h prn  ??  # Vaginal discharge: Vaginitis screen + for BV;  - will start intravaginal clindamycin for 7 days nightly;   ??  # T2DM: on 15u of 70/30 BID at home  - SSI  - restart home if she is able to eat, otherwise, just long acting  ??  Chronic conditions:  # Anemia:  Baseline appears to be between 10-11.  Likely related to chronic disease  # CKD:  Creatinine 1.6, which is slightly lower than baseline.  # HTN - CHF:  Continue home atorvastatin, coreg 25 BID, clonidine 0.3 BID, lasix 40 daily, hydralazine 100 TID, indur 120mg , lisinopril 20mg  daily      # Checklist:  - IVF None  - Tubes/Lines/Drains: PIV  - Diet Regular  - Bowel Regimen: Miralax 17 g daily  - DVT: SQ Lovenox  - Code Status:   Orders Placed This Encounter   Procedures   ??? Full Code     Standing Status:   Standing     Number of Occurrences:   1     - Dispo: Floor    [ ]  Anticipated Discharge Location: Home  [ ]  PT/OT/DME: No needs anticipated  [ ]  CM/SW needs: None anticipated [ ]  Meds/Rx:  Not yet prescribed. No special med needs  [ ]  Teaching: None anticipated  [ ]  Follow up appt: Appointment needed  [ ]  Excuse letter: None anticipated  [ ]  Transport: Private Needed    SUBJECTIVE:  Interval events:     Nausea has improved a little and no vomiting since ED.    Reporting feeling upset and ashamed/guilty of presenting back to ED for the pain; tearful at times;     Endorses nausea and decreased appetite for several weeks; feeling nauseated by the scent of food;   Has not had BM in 10 days; takes senna; but had bloating and pain;    Previously took some miralax and an enema? And reported diarrhea.      REVIEW OF SYSTEMS:   Pertinent positives and negatives per HPI. A complete review of systems otherwise negative.    PHYSICAL EXAM:      Intake/Output Summary (Last 24 hours) at 01/24/2019 0635  Last data filed at 01/23/2019 2340  Gross per 24 hour   Intake 1000 ml   Output ???   Net 1000  ml       Recent Vitals:  Vitals:    01/24/19 0513   BP: 124/58   Pulse: 71   Resp: 18   Temp: 36.9 ??C   SpO2: 100%       GEN: ill appearing; laying in bed; NAD  HEENT: NCAT,  CV: Regular rate and rhythm. No murmurs appreciated  Pulm: Normal work of breathing on RA anterior lung fields clear   Abd: soft; NT  Neuro: Alert No focal deficits.  Ext: No peripheral edema.  Palpable distal pulses.  Skin: No rashes or skin lesions.         LABS/ STUDIES:    All imaging, laboratory studies, and other pertinent tests including electrocardiography within the last 24 hours were reviewed and are summarized within the assessment and plan.     NUTRITION:         Daphene Jaeger, MD PGY2  January 24, 2019 6:35 AM

## 2019-01-24 NOTE — Unmapped (Signed)
CM initial assessment completed telephonically as a precautionary measure in accordance with [facility] COVID-19 Pandemic emergency response plan. Patient verbalized understanding and agreement with completing this assessment by telephone.     Patient being ruled out for COVID, cm spoke with patient via phone.  Patient lives alone.  She does not qualify for another grocery bag as she received one in July of 2020.  CM entered resources for patient in AVS.  She has someone that can pick her up at discharge.  Needs to be determined at this time.  CM to follow.          Care Management  Initial Transition Planning Assessment   Type of Residence: Mailing Address:  [address] [place] [address]  Contacts: Accompanied by: Alone Extended Emergency Contact Information  Primary Emergency Contact: Odelia, Graciano  Mobile Phone: [phone]  Relation: Son    Patient Phone Number: [phone] (home)           Medical Provider(s): [facility]  Reason for Admission: Admitting Diagnosis:  No admission diagnoses are documented for this encounter.  Past Medical History:   has a past medical history of Diabetes mellitus (CMS-HCC), HLD (hyperlipidemia), Hypertension, and Renal disorder.  Past Surgical History:   has no past surgical history on file.   Previous admit date: 12/16/2018    Primary Insurance- Payor: MEDICAID [place] / Plan: MEDICAID [place] / Product Type: *No Product type* /   Secondary Insurance ??? None  Prescription Coverage ??? Medicaid  Preferred Pharmacy - [id number] - [place], [place] - [address]  [facility] [place] [place] - [address]  [facility]  [facility]    Transportation home: Private vehicle  Level of function prior to admission: Requires Assistance, uses a walker at baseline.                 General Care Manager assessed the patient by : Telephone conversation with family, Discussion with Clinical Care team, Medical record review(CM spoke with patient via phone this am d/t COVID 19 rule out)  Orientation Level: Oriented X4  Who provides care at home?: N/A  Reason for referral: Discharge Planning    Contact/Decision Maker  Extended Emergency Contact Information  Primary Emergency Contact: Ailee, Pates  Mobile Phone: [phone]  Relation: Son    Armed forces operational officer Next of Kin / Guardian / POA / Advance Directives     HCDM (patient stated preference): Leatrice, Parilla - Son - [phone]    Advance Directive (Medical Treatment)  Does patient have an advance directive covering medical treatment?: Patient does not have advance directive covering medical treatment.    Health Care Decision Maker [HCDM] (Medical & Mental Health Treatment)  Healthcare Decision Maker: Patient does not wish to appoint a Health Care Decision Maker at this time  Information offered on HCDM, Medical & Mental Health advance directives:: Patient given information.         Patient Information  Lives with: Alone    Type of Residence: Private residence        Location/Detail: [address], [place] [address]    Support Systems/Concerns: Family Members, Children, Friends/Neighbors    Responsibilities/Dependents at home?: No    Home Care services in place prior to admission?: No          Outpatient/Community Resources in place prior to admission:  Clinic  Agency detail (Name/Phone #): PCP: [facility]    Equipment Currently Used at Home: walker, rolling       Currently receiving outpatient dialysis?: No       Financial Information       Need for financial assistance?: No       Social Determinants of Health  Social Determinants of Health were addressed in provider documentation.  Please refer to patient history.  Social History     Socioeconomic History   ??? Marital status: Single     Spouse name: None ??? Number of children: 3   ??? Years of education: None   ??? Highest education level: None   Occupational History   ??? None   Social Needs   ??? Financial resource strain: None   ??? Food insecurity     Worry: Often true     Inability: Often true   ??? Transportation needs     Medical: No     Non-medical: No   Tobacco Use   ??? Smoking status: Former Smoker     Types: Cigarettes   ??? Smokeless tobacco: Never Used   Substance and Sexual Activity   ??? Alcohol use: No     Alcohol/week: 0.0 standard drinks   ??? Drug use: Yes     Types: Marijuana     Comment: Last use in April 2020   ??? Sexual activity: None   Lifestyle   ??? Physical activity     Days per week: None     Minutes per session: None   ??? Stress: None   Relationships   ??? Social Wellsite geologist on phone: None     Gets together: None     Attends religious service: None     Active member of club or organization: None     Attends meetings of clubs or organizations: None     Relationship status: None   Other Topics Concern   ??? None   Social History Narrative    Lives alone in [place].  Never married.  3 children (1 son and 2 daughters).  Son live in [place] and she plans to move to an apartment near him. 23 grandchildren.   Former smoker.  Half [employer] x5-10 years.  Quit in 50s.  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  Associates degree in computer programming and bachelors in hospitality/tourism.     Housing/Utilities   ??? Within the past 12 months, have you ever stayed: outside, in a car, in a tent, in an overnight shelter, or temporarily in someone else's home (i.e. couch-surfing)?     ??? Are you worried about losing your housing?     ??? Within the past 12 months, have you been unable to get utilities (heat, electricity) when it was really needed?       Literacy   ??? How often do you need to have someone help you when you read instructions, pamphlets, or other written material from your doctor or pharmacy?         Discharge Needs Assessment Concerns to be Addressed: denies needs/concerns at this time, discharge planning    Clinical Risk Factors: Multiple Diagnoses (Chronic), Readmission < 30 Days    Barriers to taking medications: No    Prior overnight hospital stay or ED visit in last 90 days: Yes    Readmission Within the Last 30 Days: previous discharge plan unsuccessful  Anticipated Changes Related to Illness: none    Equipment Needed After Discharge: none    Discharge Facility/Level of Care Needs: other (see comments)(Home with self care)    Readmission  Risk of Unplanned Readmission Score:  %  Predictive Model Details   No score data available for [facility] Risk of Unplanned Readmission     Readmitted Within the Last 30 Days? (No if blank)   Patient at risk for readmission?: No    Discharge Plan  Screen findings are: Care Manager reviewed the plan of the patient's care with the Multidisciplinary Team. No discharge planning needs identified at this time. Care Manager will continue to manage plan and monitor patient's progress with the team.    Expected Discharge Date: 01/26/2019    Expected Transfer from Critical Care: (N/A)    Patient and/or family were provided with choice of facilities / services that are available and appropriate to meet post hospital care needs?: N/A       Initial Assessment complete?: Yes  Clenton Pare  January 24, 2019 9:48 AM

## 2019-01-24 NOTE — Unmapped (Addendum)
Alexandra Richards is a 58 y.o. female with a past medical history significant for HTN, T2DM, CKD, HFpEF, metastatic breast cancer who presents with nausea and vomiting; and constipation .    Nausea and vomiting - Constipation:  CT in ED showed colitis and significant stool burden; she had not stooled for 11 days. Nausea was controled with zofran.  Her electroyltes did not need repletition; prior to discharge she was tolerating a regular diet; pain was controlled with oral meds.  She had multiple BMs after x2enema; and mag citrate along with miralax TID and Senna BID; She was discharged on zofran and rectal phenergan PRN   For bowel regimen: Miralax and Senna BID; can decrease after normal stooling.   ??  # Metastatic Breast Cancer on oral chemotherapy - Pathologic fracture:  home oral chemo was held during admission after discussing case with outpatient Oncologist; pt will follow upon discharge.   ??  # BV: started on clindamycin; vaginal suppository; continue course for 7 day treatment ??    # T2DM:  Glucose 140s-150s during admission. Patient discharged back on home 15u of 70/30 BID - SSI  # Anemia:  Baseline appears to be between 10-11.  Likely related to chronic disease  # CKD:  Creatinine 1.6, which is slightly lower than baseline.  # HTN - CHF:  Continue home atorvastatin, coreg 25 BID, clonidine 0.3 BID, lasix 40 daily, hydralazine 100 TID, indur 120mg , lisinopril 20mg  daily

## 2019-01-24 NOTE — Unmapped (Signed)
Family Medicine Inpatient Service    History and Physical Note    Team: [facility] (pgr [phone])    PCP: [facility]  Date of Admission: January 23, 2019  Code Status: full code  Emergency Contact: Frankey Shown [place], son [phone])    ASSESSMENT / PLAN:   Alexandra Richards is a 58 y.o. female with a past medical history significant for HTN, T2DM, CKD, HFpEF, metastatic breast cancer who presents with nausea and vomiting    # Nausea and vomiting - Abdominal Pain:  1 day of nausea, vomiting and abdominal pain. CT in ED showed colitis and significant stool burden.  On exam she is tender in left quadrants and suprapubic.  Unclear cause of nausea but likely multifactorial from chemotherapy, colitis, constipation, gastroparesis. Could also consider UTI as a contributing factor as she has had some burning with urination and was tender in suprapubic region of her abdomen.   - monitor Qtc  - zofran q8h PRN  - could consider compazine. Was prescribed after last hospitalization, but did not take after leaving hospital  - f/u UA and urine culture  - continue home senna daily  - start miralax daily, patient reported diarrhea before, but she likely needs an osmotic laxative  - mIVF for 12 hours    # Metastatic Breast Cancer on oral chemotherapy - Pathologic fracture:  Started oral capecitabine 1500mg  PO BID on 10/17.  She will taken 14 days on and 7 days off.  Vomiting may be related to medication. This medication is not on formulary, so patient will need to bring it in to be able to continue it while inpatient.  She is scheduled to take it through 10/30  - discuss restarting home capecitabine 1500mg  PO BID through 10/30  - consider reaching out to oncologist about nausea  - continue home oxycodone 10mg  q6h prn    # Vaginal discharge:  She has recently had vaginal discharge and a foul odor.    - wet prep    # T2DM: on 15u of 70/30 BID at home  - SSI - restart home if she is able to eat, otherwise, just long acting    Chronic conditions:  # Anemia:  Baseline appears to be between 10-11.  Likely related to chronic disease  # CKD:  Creatinine 1.6, which is slightly lower than baseline.  # HTN - CHF:  Continue home atorvastatin, coreg 25 BID, clonidine 0.3 BID, lasix 40 daily, hydralazine 100 TID, indur 120mg , lisinopril 20mg  daily    # FEN/GI:  - IVF LR at 124mL/hr  - Check electrolytes as indicated, replete as needed.  - Diet Regular    # PPX:   - DVT: SQ Lovenox    # Dispo: Floor  [ ]  Anticipated Discharge Location: Home  [ ]  PT/OT/DME: PT/OT ordered  [ ]  CM/SW needs: None anticipated  [ ]  Meds/Rx:  Not yet prescribed. No special med needs  [ ]  Teaching: None anticipated  [ ]  Follow up appt: Appointment needed  [ ]  Excuse letter: None anticipated  [ ]  Transport: Private Needed      HISTORY OF PRESENT ILLNESS:  Alexandra Richards is a 58 y.o. female who presents with nausea, vomiting, and cramping lower abdominal pain since yesterday.  She was recently admitted from 10/9-10/10 for similar symptoms.  She also recently started oral chemotherapy on 10/17.  She reports that on 10/27 she started having nausea and vomiting.  She also reports that she has not  had a bowel movement in about 10 days.  She has been taking senna, but not miralax.  On 10/28, she has had some difficulty with urination.  She urinated in the AM and had some burning associated with it, but throughout the day, was unable to urinate until coming to the ED and getting some IV fluids.  She has also recently noticed some foul odor and discharge from her vagina.  She also reports body chills, but no fever, headache yesterday, and blurry vision for 1 week. ED Course: In the ED she was given zofran and morphine for her nausea and pain.  Labs were significant for a Creatinine of 1.6, Hgb of 9.5, normal lipase, and a pH of 7.44.  CT abdomen/pelvis showed possible colitis, considerable stool burden, metastatic lesions, and a moderate left pleural effusion.    COVID-19 Universal Testing on Admission (if asymptomatic, does not need repeat if done with the past 7 days): Asymptomatic & Pending    PAST MEDICAL / SURGICAL HX:  Past Medical History:   Diagnosis Date   ??? Diabetes mellitus (CMS-HCC)    ??? HLD (hyperlipidemia)    ??? Hypertension    ??? Renal disorder      No past surgical history on file.    FAMILY HX:   History reviewed. No pertinent family history.    SOCIAL HX:   Social History     Socioeconomic History   ??? Marital status: Single     Spouse name: None   ??? Number of children: 3   ??? Years of education: None   ??? Highest education level: None   Occupational History   ??? None   Social Needs   ??? Financial resource strain: None   ??? Food insecurity     Worry: Often true     Inability: Often true   ??? Transportation needs     Medical: None     Non-medical: None   Tobacco Use   ??? Smoking status: Former Smoker     Types: Cigarettes   ??? Smokeless tobacco: Never Used   Substance and Sexual Activity   ??? Alcohol use: No     Alcohol/week: 0.0 standard drinks   ??? Drug use: Yes     Types: Marijuana     Comment: Last use in April 2020   ??? Sexual activity: None   Lifestyle   ??? Physical activity     Days per week: None     Minutes per session: None   ??? Stress: None   Relationships   ??? Social Wellsite geologist on phone: None     Gets together: None     Attends religious service: None     Active member of club or organization: None     Attends meetings of clubs or organizations: None     Relationship status: None   Other Topics Concern   ??? None   Social History Narrative Lives alone in [place].  Never married.  3 children (1 son and 2 daughters).  Son live in [place] and she plans to move to an apartment near him. 23 grandchildren.   Former smoker.  Half [employer] x5-10 years.  Quit in 63s.  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  Associates degree in computer programming and bachelors in hospitality/tourism.       MEDICATIONS / ALLERGIES:  (Not in a hospital admission)      No Known Allergies  IMMUNIZATIONS:   Immunization History   Administered Date(s) Administered   ??? Influenza Vaccine Quad (IIV4 PF) 110mo+ injectable 03/21/2017, 01/04/2018   ??? Influenza Virus Vaccine, unspecified formulation 12/22/2018   ??? PNEUMOCOCCAL POLYSACCHARIDE 23 07/28/2003, 03/21/2017, 01/05/2018       REVIEW OF SYSTEMS:  Pertinent positives and negatives per HPI. A complete review of systems otherwise negative.    PHYSICAL EXAM:    Initial ED Vitals:   ED Triage Vitals   Enc Vitals Group      BP 01/23/19 1945 141/65      Heart Rate 01/23/19 1945 92      SpO2 Pulse 01/23/19 2124 89      Resp 01/23/19 1945 17      Temp 01/23/19 1945 37.4 ??C      Temp src --       SpO2 01/23/19 1945 99 %      Weight --       Height --       Head Circumference --       Peak Flow --       Pain Score --       Pain Loc --       Pain Edu? --       Excl. in GC? --        Recent Vitals:  Vitals:    01/23/19 2200   BP: 145/68   Pulse: 89   Resp: 20   Temp:    SpO2: 97%       GEN: Chronically ill appearing, lying in bed, NAD   Eyes: PERRL. No scleral icterus. Conjunctiva non-erythematous. EOMI.  HEENT: NCAT.  Neck: Supple.  CV: Regular rate and rhythm. No murmurs/rubs/gallops. No costochondral tenderness. No cyanosis or clubbing.   Pulm: CTAB. No wheezing, crackles, or rhonchi.  Abd: Flat.  Tender in left upper and lower quadrant and in suprapubic region. No guarding, rebound.  Normoactive bowel sounds.    Neuro: A&O x 3. No focal deficits. Strength 5/5 UE/LE. Distal sensation to light touch intact. Ext: No peripheral edema.  Palpable distal pulses.  Skin: No rashes or skin lesions.       LABS/ STUDIES:  All imaging, laboratory studies, and other pertinent tests including electrocardiography were reviewed prior to admission and are summarized within the assessment and plan.     Nada Boozer, MD PGY2  January 23, 2019 11:39 PM

## 2019-01-24 NOTE — Unmapped (Signed)
RADIATION ONCOLOGY TREATMENT COMPLETION NOTE    Encounter Date: 01/24/2019  Patient Name: Alexandra Richards  Medical Record Number: [id number]    Referring Physician: Kerrin Champagne MD medical oncology    Primary Care Provider: [facility]    DIAGNOSIS:  Metastatic breast cancer  58 y.o. female found to have malignant neoplasm of the breast metastatic to the bone.  She has severe lytic lesions causing multiple fractures in the sacrum and lytic lesions of the bilateral femoral heads with additional lesions found in the pelvis.    CT of the chest shows a 3.8 cm mass in the left breast. Multiple pulmonary nodules.    TREATMENT INTENT: palliative    CLINICAL TRIAL: no    CHEMOTHERAPY: not administered    RADIATION TREATMENT SUMMARY:          Treatment site Treatment Technique/Modality Energy Dose per fraction Total number  of fractions Total dose Start date End date   Pelvis and bilateral femoral heads / hips 3D CRT 15 MV 300 cGy 10 3000 cGy 12/21/2018   01/02/2019     COMPLETED INTENDED COURSE:  Yes    TREATMENT BREAK > 2 WEEKS:  No    TOLERANCE TO TREATMENT:  Mild toxicities or complications not requiring treatment    FEEDING TUBE:  no    PLAN FOR FOLLOW-UP: Taneeka Renee Raboin is to return for follow up with me/our group as needed and for follow up in 1 week(s) with Med Onc.    First two fractions at [facility] as inpatient, remaining 8 treatments as outpatient at [facility].    Felix Ahmadi, MD  01/24/19 10:49 AM

## 2019-01-24 NOTE — Unmapped (Signed)
Pt to ED for reports of nausea and abd pain.  Pt reports she has not had a BM x 10 days.  Is currently on chemo for breast CA

## 2019-01-25 LAB — URINALYSIS
BILIRUBIN UA: NEGATIVE
BLOOD UA: NEGATIVE
KETONES UA: NEGATIVE
NITRITE UA: NEGATIVE
PH UA: 6 (ref 5.0–9.0)
PROTEIN UA: 100 — AB
RBC UA: <1 /HPF (ref <4)
SPECIFIC GRAVITY UA: 1.009 (ref 1.005–1.040)
SQUAMOUS EPITHELIAL: 12 /HPF — ABNORMAL HIGH (ref 0–5)
UROBILINOGEN UA: 0.2
WBC UA: >100 /HPF — ABNORMAL HIGH (ref 0–5)

## 2019-01-25 LAB — MAGNESIUM: Magnesium:MCnc:Pt:Ser/Plas:Qn:: 1.9

## 2019-01-25 LAB — BASIC METABOLIC PANEL
ANION GAP: 8 mmol/L (ref 7–15)
BLOOD UREA NITROGEN: 23 mg/dL — ABNORMAL HIGH (ref 7–21)
CALCIUM: 8.1 mg/dL — ABNORMAL LOW (ref 8.5–10.2)
CHLORIDE: 107 mmol/L (ref 98–107)
CO2: 21 mmol/L — ABNORMAL LOW (ref 22.0–30.0)
CREATININE: 1.62 mg/dL — ABNORMAL HIGH (ref 0.60–1.00)
EGFR CKD-EPI NON-AA FEMALE: 34 mL/min/{1.73_m2} — ABNORMAL LOW (ref >=60)
GLUCOSE RANDOM: 237 mg/dL — ABNORMAL HIGH (ref 70–179)
POTASSIUM: 4.2 mmol/L (ref 3.5–5.0)
SODIUM: 136 mmol/L (ref 135–145)

## 2019-01-25 LAB — CO2: Carbon dioxide:SCnc:Pt:Ser/Plas:Qn:: 21 — ABNORMAL LOW

## 2019-01-25 LAB — COLOR

## 2019-01-25 NOTE — Unmapped (Signed)
Patient alert and oriented, vitals stable.  No complaints of pain.  SMOG enema and magnesium citrate given in addition to scheduled regimen.  Multiple bowel movements during shift, form and round.  Brown in color.  Call bell in reach, continue to monitor.   Problem: Adult Inpatient Plan of Care  Goal: Plan of Care Review  Outcome: Progressing  Goal: Patient-Specific Goal (Individualization)  Outcome: Progressing  Goal: Absence of Hospital-Acquired Illness or Injury  Outcome: Progressing  Goal: Optimal Comfort and Wellbeing  Outcome: Progressing  Goal: Readiness for Transition of Care  Outcome: Progressing  Goal: Rounds/Family Conference  Outcome: Progressing     Problem: Self-Care Deficit  Goal: Improved Ability to Complete Activities of Daily Living  Outcome: Progressing     Problem: Diabetes Comorbidity  Goal: Blood Glucose Level Within Desired Range  Outcome: Progressing     Problem: Heart Failure Comorbidity  Goal: Maintenance of Heart Failure Symptom Control  Outcome: Progressing     Problem: Hypertension Comorbidity  Goal: Blood Pressure in Desired Range  Outcome: Progressing     Problem: Pain Acute  Goal: Optimal Pain Control  Outcome: Progressing     Problem: Adjustment to Illness (Heart Failure)  Goal: Optimal Coping  Outcome: Progressing     Problem: Arrhythmia/Dysrhythmia (Heart Failure)  Goal: Stable Heart Rate and Rhythm  Outcome: Progressing     Problem: Cardiac Output Decreased (Heart Failure)  Goal: Optimal Cardiac Output  Outcome: Progressing     Problem: Fluid Imbalance (Heart Failure)  Goal: Fluid Balance  Outcome: Progressing     Problem: Functional Ability Impaired (Heart Failure)  Goal: Optimal Functional Ability  Outcome: Progressing     Problem: Oral Intake Inadequate (Heart Failure)  Goal: Optimal Nutrition Intake  Outcome: Progressing     Problem: Respiratory Compromise (Heart Failure)  Goal: Effective Oxygenation and Ventilation  Outcome: Progressing Problem: Sleep Disordered Breathing (Heart Failure)  Goal: Effective Breathing Pattern During Sleep  Outcome: Progressing     Problem: Fall Injury Risk  Goal: Absence of Fall and Fall-Related Injury  Outcome: Progressing

## 2019-01-25 NOTE — Unmapped (Signed)
Family Medicine Inpatient Service    Progress Note    Team: [facility] [place] [facility] (pgr [phone])    Hospital Day: 0    ASSESSMENT / PLAN:   Alexandra Richards is a 58 y.o. female with HTN, T2DM, CKD, HFpEF, metastatic breast cancer (on oral chemo) who presents with nausea and vomiting; and constipation; Nausea has improved somewhat but has not had a BM in 10 days.       Nausea and vomiting - Constipation - Abdominal Pain:  nausea improved; no Vomiting; continues to have decreased appetite; did not have a BM after miralax and colace enema;   -plan for smog enema and mag citrate; continuemiralax to TID;  - zofran q8h PRN    ??  # Metastatic Breast Cancer on oral chemotherapy - Pathologic fracture:  Discussed case with Oncologist; and will plan to hold meds until follow up for now.   Started oral capecitabine 1500mg  PO BID on 10/17.  She will taken 14 days on and 7 days off.   - scheduling tylenol 1 gram q8   - continue home oxycodone 10mg  q6h prn  ??  # Vaginal discharge: Vaginitis screen + for BV;  -intravaginal clindamycin for 7 days nightly;   ??  # T2DM: on 15u of 70/30 BID at home  - SSI    ??  Chronic conditions:  # Anemia:  Baseline appears to be between 10-11.  Likely related to chronic disease  # CKD:  Creatinine 1.6, which is slightly lower than baseline.  # HTN - CHF:  Continue home atorvastatin, coreg 25 BID, clonidine 0.3 BID, lasix 40 daily, hydralazine 100 TID, indur 120mg , lisinopril 20mg  daily      # Checklist:  - IVF None  - Tubes/Lines/Drains: PIV  - Diet Regular  - Bowel Regimen: Miralax 17 g daily  - DVT: SQ Lovenox  - Code Status:   Orders Placed This Encounter   Procedures   ??? Full Code     Standing Status:   Standing     Number of Occurrences:   1     - Dispo: Floor    [ ]  Anticipated Discharge Location: Home  [ ]  PT/OT/DME: No needs anticipated  [ ]  CM/SW needs: None anticipated  [ ]  Meds/Rx:  Not yet prescribed. No special med needs  [ ]  Teaching: None anticipated [ ]  Follow up appt: Appointment needed  [ ]  Excuse letter: None anticipated  [ ]  Transport: Private Needed    SUBJECTIVE:  Interval events:     No acute events overnight; did not have BM yesterday  Continues to have decreased appetite     REVIEW OF SYSTEMS:   Pertinent positives and negatives per HPI. A complete review of systems otherwise negative.    PHYSICAL EXAM:      Intake/Output Summary (Last 24 hours) at 01/25/2019 1202  Last data filed at 01/24/2019 1800  Gross per 24 hour   Intake ???   Output 600 ml   Net -600 ml       Recent Vitals:  Vitals:    01/25/19 0817   BP: 178/77   Pulse: 77   Resp:    Temp:    SpO2:        GEN: ill appearing; laying in bed; NAD  HEENT: NCAT,  CV: Regular rate and rhythm. No murmurs appreciated  Pulm: Normal work of breathing on RA anterior lung fields clear   Abd: soft; NT  Neuro: Alert No focal deficits.  Ext: No peripheral edema.  Palpable distal pulses.  Skin: No rashes or skin lesions.         LABS/ STUDIES:    All imaging, laboratory studies, and other pertinent tests including electrocardiography within the last 24 hours were reviewed and are summarized within the assessment and plan.     NUTRITION:         Daphene Jaeger, MD PGY2

## 2019-01-25 NOTE — Unmapped (Signed)
Problem: Adult Inpatient Plan of Care  Goal: Plan of Care Review  Outcome: Progressing  Goal: Patient-Specific Goal (Individualization)  Outcome: Progressing  Goal: Absence of Hospital-Acquired Illness or Injury  Outcome: Progressing  Goal: Optimal Comfort and Wellbeing  Outcome: Progressing  Goal: Readiness for Transition of Care  Outcome: Progressing  Goal: Rounds/Family Conference  Outcome: Progressing   VSS. Drowsy but oriented. No c/o pain. OOB x1 ast. UA/cult collected

## 2019-01-25 NOTE — Unmapped (Signed)
Patient alert but sleepy during shift.  Patient vitals stable.  No complaints of pain or nausea.  Small bowel movement during shift.  Refused therapy reporting they can come tomorrow.  SCD's on.  Voiding without difficulty.  Call bell in reach, continue to monitor.   Problem: Adult Inpatient Plan of Care  Goal: Plan of Care Review  Outcome: Progressing  Goal: Patient-Specific Goal (Individualization)  Outcome: Progressing  Goal: Absence of Hospital-Acquired Illness or Injury  Outcome: Progressing  Goal: Optimal Comfort and Wellbeing  Outcome: Progressing  Goal: Readiness for Transition of Care  Outcome: Progressing  Goal: Rounds/Family Conference  Outcome: Progressing     Problem: Self-Care Deficit  Goal: Improved Ability to Complete Activities of Daily Living  Outcome: Progressing     Problem: Diabetes Comorbidity  Goal: Blood Glucose Level Within Desired Range  Outcome: Progressing     Problem: Heart Failure Comorbidity  Goal: Maintenance of Heart Failure Symptom Control  Outcome: Progressing     Problem: Hypertension Comorbidity  Goal: Blood Pressure in Desired Range  Outcome: Progressing     Problem: Pain Acute  Goal: Optimal Pain Control  Outcome: Progressing     Problem: Adjustment to Illness (Heart Failure)  Goal: Optimal Coping  Outcome: Progressing     Problem: Arrhythmia/Dysrhythmia (Heart Failure)  Goal: Stable Heart Rate and Rhythm  Outcome: Progressing     Problem: Cardiac Output Decreased (Heart Failure)  Goal: Optimal Cardiac Output  Outcome: Progressing     Problem: Fluid Imbalance (Heart Failure)  Goal: Fluid Balance  Outcome: Progressing     Problem: Functional Ability Impaired (Heart Failure)  Goal: Optimal Functional Ability  Outcome: Progressing     Problem: Oral Intake Inadequate (Heart Failure)  Goal: Optimal Nutrition Intake  Outcome: Progressing     Problem: Respiratory Compromise (Heart Failure)  Goal: Effective Oxygenation and Ventilation  Outcome: Progressing Problem: Sleep Disordered Breathing (Heart Failure)  Goal: Effective Breathing Pattern During Sleep  Outcome: Progressing

## 2019-01-26 MED ORDER — SENNOSIDES 8.6 MG TABLET
ORAL_TABLET | Freq: Two times a day (BID) | ORAL | 2 refills | 30 days | Status: CP
Start: 2019-01-26 — End: 2019-04-26

## 2019-01-26 MED ORDER — CLINDAMYCIN 2 % VAGINAL CREAM
Freq: Every evening | VAGINAL | 0 refills | 5.00000 days | Status: CP
Start: 2019-01-26 — End: 2019-01-31

## 2019-01-26 MED ORDER — POLYETHYLENE GLYCOL 3350 17 GRAM/DOSE ORAL POWDER
Freq: Two times a day (BID) | ORAL | 0 refills | 90.00000 days | Status: CP
Start: 2019-01-26 — End: 2019-04-26

## 2019-01-26 MED ORDER — INSULIN HUMAN U-100 NPH-REGULR 70-30 MIX 100 UNIT/ML SUBCUTANEOUS SUSP
Freq: Two times a day (BID) | SUBCUTANEOUS | 0 refills | 30.00000 days | Status: CP
Start: 2019-01-26 — End: 2019-02-25

## 2019-01-26 MED ORDER — PROMETHAZINE 25 MG RECTAL SUPPOSITORY
Freq: Three times a day (TID) | RECTAL | 0 refills | 10 days | Status: CP | PRN
Start: 2019-01-26 — End: ?

## 2019-01-26 MED ORDER — MIRTAZAPINE 15 MG TABLET
ORAL_TABLET | Freq: Every evening | ORAL | 2 refills | 30.00000 days | Status: CP
Start: 2019-01-26 — End: 2019-04-26

## 2019-01-26 NOTE — Unmapped (Signed)
Physician Discharge Summary HBR  3 BT1 HBR  [address]  [place] [address]  Dept: [phone]  Loc: [phone]     Identifying Information:   Alexandra Richards  Aug 09, 1960  [id number]    Primary Care Physician: Tyson Babinski [facility]   Code Status: Full Code    Admit Date: 01/23/2019    Discharge Date: 01/26/2019     Discharge To: Home    Discharge Service: [facility] - FAM [place] [facility]     Discharge Attending Physician: Landry Corporal, MD    Discharge Diagnoses:  Principal Problem (Resolved):    Non-intractable vomiting with nausea  Active Problems:    Type 2 diabetes mellitus, with long-term current use of insulin (CMS-HCC)    Essential hypertension    CKD (chronic kidney disease)    (HFpEF) heart failure with preserved ejection fraction (CMS-HCC)    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 female with a past medical history significant for HTN, T2DM, CKD, HFpEF, metastatic breast cancer who presents with nausea and vomiting; and constipation .    Nausea and vomiting - Constipation:  CT in ED showed colitis and significant stool burden; she had not stooled for 11 days. Nausea was controled with zofran.  Her electroyltes did not need repletition; prior to discharge she was tolerating a regular diet; pain was controlled with oral meds.  She had multiple BMs after x2enema; and mag citrate along with miralax TID and Senna BID; She was discharged on zofran and rectal phenergan PRN   For bowel regimen: Miralax and Senna BID; can decrease after normal stooling. # Metastatic Breast Cancer on oral chemotherapy - Pathologic fracture:  home oral chemo was held during admission after discussing case with outpatient Oncologist; pt will follow upon discharge.   ??  # BV: started on clindamycin; vaginal suppository; continue course for 7 day treatment ??    # T2DM:  Glucose 140s-150s during admission. Patient discharged back on home 15u of 70/30 BID - SSI  # Anemia:  Baseline appears to be between 10-11.  Likely related to chronic disease  # CKD:  Creatinine 1.6, which is slightly lower than baseline.  # HTN - CHF:  Continue home atorvastatin, coreg 25 BID, clonidine 0.3 BID, lasix 40 daily, hydralazine 100 TID, indur 120mg , lisinopril 20mg  daily      Touchbase with Outpatient Provider:  Warm Handoff: Completed on 01/25/19   by Daphene Jaeger  (Resident) via In-Basket Message    Procedures:  None  No admission procedures for hospital encounter.  ______________________________________________________________________  Discharge Medications:     Your Medication List      STOP taking these medications    prochlorperazine 10 MG tablet  Commonly known as: COMPAZINE        START taking these medications    clindamycin 2 % vaginal cream  Commonly known as: CLEOCIN  Insert 1 applicator into the vagina nightly for 5 days.     mirtazapine 15 MG tablet  Commonly known as: REMERON  Take 1 tablet (15 mg total) by mouth nightly. For sleep and appetite;     promethazine 25 MG suppository  Commonly known as:  PHENERGAN  Insert 1 suppository (25 mg total) into the rectum every eight (8) hours as needed for nausea (vomiting) for up to 30 doses.        CHANGE how you take these medications    polyethylene glycol 17 gram/dose powder  Commonly known as: MIRALAX  Take 17 g by mouth Two (2) times a day.  What changed:   ?? when to take this  ?? reasons to take this     senna 8.6 mg tablet  Commonly known as: SENOKOT  Take 2 tablets by mouth Two (2) times a day.  What changed: when to take this CONTINUE taking these medications    amLODIPine 5 MG tablet  Commonly known as: NORVASC  Take 2 tablets (10 mg total) by mouth daily.     atorvastatin 40 MG tablet  Commonly known as: LIPITOR  Take 40 mg by mouth nightly.     capecitabine 500 MG tablet  Commonly known as: XELODA  Take 3 tablets (1,500 mg total) by mouth Two (2) times a day . For 14 days on then 7 days off     carvediloL 25 MG tablet  Commonly known as: COREG  Take 1 tablet (25 mg total) by mouth Two (2) times a day.     cholecalciferol (vitamin D3) 1,000 unit (25 mcg) tablet  Take 2 tablets (2,000 Units total) by mouth daily.     cloNIDine HCL 0.3 MG tablet  Commonly known as: CATAPRES  Take 0.3 mg by mouth Two (2) times a day.     furosemide 40 MG tablet  Commonly known as: LASIX  Take 40 mg by mouth daily. Take additional 40 mg if weight gain >3 lb in 1 day or leg swelling     hydrALAZINE 100 MG tablet  Commonly known as: APRESOLINE  Take 1 tablet (100 mg total) by mouth Three (3) times a day.     insulin NPH-insulin regular (70/30) 100 unit/mL (70-30) injection  Commonly known as: HumuLIN/NovoLIN  Inject 0.15 mL (15 Units total) under the skin Two (2) times a day (30 minutes before a meal). Can increase by 5 units if glucose is greater than 140.     isosorbide mononitrate 60 MG 24 hr tablet  Commonly known as: IMDUR  Take 2 tablets (120 mg total) by mouth daily.     lisinopriL 20 MG tablet  Commonly known as: PRINIVIL,ZESTRIL  Take 20 mg by mouth daily.     melatonin 5 mg tablet  Take 5 mg by mouth nightly.     ON CALL EXPRESS TEST STRIP Strp  Generic drug: blood sugar diagnostic  Use to check blood sugar by Other route Four (4) times a day (before meals and nightly).     ondansetron 4 MG disintegrating tablet  Commonly known as: ZOFRAN-ODT  Take 1 tablet (4 mg total) by mouth every six (6) hours as needed for nausea.     oxyCODONE 10 mg immediate release tablet  Commonly known as: ROXICODONE Take 1 tablet (10 mg total) by mouth every six (6) hours as needed for pain.     TylenoL 325 MG tablet  Generic drug: acetaminophen  Take 650 mg by mouth every six (6) hours as needed.            Allergies:  Patient has no known allergies.  ______________________________________________________________________  Pending Test Results (if blank, then none):      Most Recent Labs:  All lab results last 24 hours -  Recent Results (from the past 24 hour(s))   POCT Glucose    Collection Time: 01/25/19 11:48 AM   Result Value Ref Range    Glucose, POC 150 70 - 179 mg/dL   POCT Glucose    Collection Time: 01/25/19  5:53 PM   Result Value Ref Range    Glucose, POC 171 70 - 179 mg/dL   POCT Glucose    Collection Time: 01/25/19  8:58 PM   Result Value Ref Range    Glucose, POC 172 70 - 179 mg/dL   POCT Glucose    Collection Time: 01/26/19  7:54 AM   Result Value Ref Range    Glucose, POC 220 (H) 70 - 179 mg/dL       Relevant Studies/Radiology (if blank, then none):  Ecg 12 Lead (adult)    Result Date: 01/24/2019  NORMAL SINUS RHYTHM NONSPECIFIC T WAVE ABNORMALITY ABNORMAL ECG WHEN COMPARED WITH ECG OF 04-Jan-2019 04:19, T WAVE AMPLITUDE HAS DECREASED IN ANTERIOR LEADS Confirmed by Pollyann Kennedy ([id number]) on 01/24/2019 6:11:10 PM    Ct Abdomen Pelvis With Iv Contrast Only    Result Date: 01/24/2019 EXAM: CT ABDOMEN PELVIS W CONTRAST DATE: 01/23/2019 8:59 PM ACCESSION: [id number] UN DICTATED: 01/23/2019 9:19 PM INTERPRETATION LOCATION: [facility] CLINICAL INDICATION: diffuse abdominal pain, constipation ; Abdominal pain, acute, nonlocalized  COMPARISON: CT abdomen pelvis 01/03/2019 and prior TECHNIQUE: A spiral CT scan of the abdomen and pelvis was obtained with IV contrast from the lung bases through the pubic symphysis. Images were reconstructed in the axial plane. Coronal and sagittal reformatted images were also provided for further evaluation. FINDINGS: LINES AND TUBES: None. LOWER THORAX: Moderate left pleural effusion with associated atelectasis. Unchanged right upper lobe pulmonary nodules as seen on prior CT chest. HEPATOBILIARY: Small right hepatic hypodensity, too small to characterize. The gallbladder is present and otherwise unremarkable. No biliary dilatation.  SPLEEN: Unremarkable. PANCREAS: Unremarkable. ADRENALS: Unremarkable. KIDNEYS/URETERS: Multiple bilateral renal hypodensities, too small to characterize. No evidence of hydronephrosis. BLADDER: Unremarkable. PELVIC/REPRODUCTIVE ORGANS: Large fibroid uterus. No large adnexal masses. GI TRACT: There is mild wall thickening of the sigmoid colon with surrounding stranding. No dilated loops of colon. Appendix is normal. PERITONEUM/RETROPERITONEUM AND MESENTERY: No free air or fluid. LYMPH NODES: No enlarged lymph nodes. VESSELS: The aorta is normal in caliber.  No significant calcified atherosclerotic disease. The portal venous system is patent. The hepatic veins and IVC are unremarkable. BONES AND SOFT TISSUES: Unchanged scattered lytic lesions within the visualized spine pelvis and femurs. -Mild wall thickening of the sigmoid colon with minimal surrounding stranding which may represent colitis. -Unchanged scattered lytic lesions within the visualized spine, pelvis and femurs and multiple right upper lobe pulmonary nodules consistent with metastatic disease. - Moderate left pleural effusion with associated atelectasis, increased from prior.    ______________________________________________________________________  Discharge Instructions:   Activity Instructions     Activity as tolerated                Other Instructions     Discharge instructions      You were admitted to [facility] for nausea, vomiting and constipation.    We gave you stool softener and enema for constipation;  Please take Senna 2 tablets twice a day AND take Miralax (powder) 2 times a day. Please drink plenty of water.  If you notice you bowels are loose; you can decrease this bowel regimen to once daily.  Since you take oxycodone- you NEED to take a stool softener regularly to prevent constipation.  For nausea; you can use your home zofran (dissovable tablet); if you are unable to tolerate anything by mouth; you can try the suppository of Phenergan.  If your nausea/vomiting is un controlled please come back to the [facility] ED for further management.    We also started you on a medication called Mirtazapine that will help with sleep, appetite, and mood.  Try to take this to help with your appetite.      Please seek medical attention if you have uncontrolled pain; difficulty breathing; chest pain; nausea and vomiting that cannot be controlled at home; or any questions or concerns about your medications or health.     Have a wonderful weekend and enjoy that homemade stew.                    Follow Up instructions and Outpatient Referrals     Discharge instructions            Appointments which have been scheduled for you    Jan 31, 2019  8:00 AM  (Arrive by 7:45 AM)  LAB ONLY with [facility] LAB WATER [address] [facility]LE[facility] REGION) [address]  [place] [address]  [phone]      Jan 31, 2019  8:30 AM  (Arrive by 8:15 AM)  RETURN ACTIVE [place] with Corrine Tasia Catchings, AGNP  [facility]COLOGY [facility]EM[facility] REGION) [address]  [place] [address]  [phone]      Feb 08, 2019 10:45 AM  RETURN  ONCOLOGY with Cherlynn Perches, MD  [facility] ORTHOPAEDICS [facility] REGION) [address] [place] [address]  [phone]      Feb 26, 2019 10:30 AM  COVID PRE TEST with [facility] COVID PRE TEST [facility] PROVIDER  RESPIRATORY DIAGNOSTIC CENTER [facility]  [facility] REGION) [address]  [place] [address]  [phone]      Feb 28, 2019  COLONOSCOPY, FLEXIBLE, PROXIMAL TO SPLENIC FLEXURE; DIAGNOSTIC, W/WO COLLECTION SPECIMEN BY BRUSH OR WASH with Bronson Curb, MD  [facility] GI PERIOP WATER [address] [facility] REGION) [address]  [place] [address]  [phone]   None    Additional instructions:     Your tele/video hospital follow up appointment will be on Monday 11/9 at 10:40 am with Dr. Illene Regulus ( no availability with Dr. Su Hilt)   phone: [phone]                ______________________________________________________________________  Discharge Day Services:  BP 149/67  - Pulse 71  - Temp 36.8 ??C (Oral)  - Resp 16  - Ht 157.5 cm (5' 2)  - Wt 85.5 kg (188 lb 9.6 oz)  - SpO2 99%  - BMI 34.50 kg/m??   Pt seen on the day of discharge and determined appropriate for discharge.  GEN: ill appearing; laying in bed; NAD  HEENT: NCAT,  CV: Regular rate and rhythm. No murmurs appreciated  Pulm: Normal work of breathing on RA anterior lung fields clear   Abd: soft; NT  Neuro: Alert No focal deficits.  Ext: No peripheral edema.  Palpable distal pulses.  Skin: No rashes or skin lesions.     Condition at Discharge: good Length of Discharge: I spent greater than 30 mins in the discharge of this patient.

## 2019-01-26 NOTE — Unmapped (Signed)
Problem: Adult Inpatient Plan of Care  Goal: Plan of Care Review  Outcome: Progressing  Goal: Patient-Specific Goal (Individualization)  Outcome: Progressing  Goal: Absence of Hospital-Acquired Illness or Injury  Outcome: Progressing  Goal: Optimal Comfort and Wellbeing  Outcome: Progressing  Goal: Readiness for Transition of Care  Outcome: Progressing  Goal: Rounds/Family Conference  Outcome: Progressing     Problem: Self-Care Deficit  Goal: Improved Ability to Complete Activities of Daily Living  Outcome: Progressing     Problem: Diabetes Comorbidity  Goal: Blood Glucose Level Within Desired Range  Outcome: Progressing     Problem: Heart Failure Comorbidity  Goal: Maintenance of Heart Failure Symptom Control  Outcome: Progressing     Problem: Hypertension Comorbidity  Goal: Blood Pressure in Desired Range  Outcome: Progressing     Problem: Pain Acute  Goal: Optimal Pain Control  Outcome: Progressing     Problem: Adjustment to Illness (Heart Failure)  Goal: Optimal Coping  Outcome: Progressing     Problem: Arrhythmia/Dysrhythmia (Heart Failure)  Goal: Stable Heart Rate and Rhythm  Outcome: Progressing     Problem: Cardiac Output Decreased (Heart Failure)  Goal: Optimal Cardiac Output  Outcome: Progressing     Problem: Fluid Imbalance (Heart Failure)  Goal: Fluid Balance  Outcome: Progressing     Problem: Functional Ability Impaired (Heart Failure)  Goal: Optimal Functional Ability  Outcome: Progressing     Problem: Oral Intake Inadequate (Heart Failure)  Goal: Optimal Nutrition Intake  Outcome: Progressing     Problem: Respiratory Compromise (Heart Failure)  Goal: Effective Oxygenation and Ventilation  Outcome: Progressing     Problem: Sleep Disordered Breathing (Heart Failure)  Goal: Effective Breathing Pattern During Sleep  Outcome: Progressing     Problem: Fall Injury Risk  Goal: Absence of Fall and Fall-Related Injury  Outcome: Progressing Pt with no acute changes as of this time. VSS and pt currently rating pain 7/10 localized to left flank. Pt voiding without difficulty and active bowel sounds in all quadrants. Tolerating diet. Neurovascular systems intact with palpable pulses and brisk capillary refill in all extremities, strong dorsi/plantarflexion and no complaints of new onset numbness/tingling. Pain managed with PRN oxycodone. Pt did not verbalize any complaints or concerns at this time and in NAD. Will continue to monitor.

## 2019-01-27 NOTE — Unmapped (Signed)
Discharge instructions, follow up appointments, and medications reviewed with patient and family. Verbalized understanding. All belongings packed and sent with patient. Discharged to home.     Problem: Adult Inpatient Plan of Care  Goal: Plan of Care Review  Outcome: Discharged to Home  Goal: Patient-Specific Goal (Individualization)  Outcome: Discharged to Home  Goal: Absence of Hospital-Acquired Illness or Injury  Outcome: Discharged to Home  Goal: Optimal Comfort and Wellbeing  Outcome: Discharged to Home  Goal: Readiness for Transition of Care  Outcome: Discharged to Home  Goal: Rounds/Family Conference  Outcome: Discharged to Home     Problem: Self-Care Deficit  Goal: Improved Ability to Complete Activities of Daily Living  Outcome: Discharged to Home     Problem: Diabetes Comorbidity  Goal: Blood Glucose Level Within Desired Range  Outcome: Discharged to Home     Problem: Heart Failure Comorbidity  Goal: Maintenance of Heart Failure Symptom Control  Outcome: Discharged to Home     Problem: Hypertension Comorbidity  Goal: Blood Pressure in Desired Range  Outcome: Discharged to Home     Problem: Pain Acute  Goal: Optimal Pain Control  Outcome: Discharged to Home     Problem: Adjustment to Illness (Heart Failure)  Goal: Optimal Coping  Outcome: Discharged to Home     Problem: Arrhythmia/Dysrhythmia (Heart Failure)  Goal: Stable Heart Rate and Rhythm  Outcome: Discharged to Home     Problem: Cardiac Output Decreased (Heart Failure)  Goal: Optimal Cardiac Output  Outcome: Discharged to Home     Problem: Fluid Imbalance (Heart Failure)  Goal: Fluid Balance  Outcome: Discharged to Home     Problem: Functional Ability Impaired (Heart Failure)  Goal: Optimal Functional Ability  Outcome: Discharged to Home     Problem: Oral Intake Inadequate (Heart Failure)  Goal: Optimal Nutrition Intake  Outcome: Discharged to Home     Problem: Respiratory Compromise (Heart Failure)  Goal: Effective Oxygenation and Ventilation Outcome: Discharged to Home     Problem: Sleep Disordered Breathing (Heart Failure)  Goal: Effective Breathing Pattern During Sleep  Outcome: Discharged to Home     Problem: Fall Injury Risk  Goal: Absence of Fall and Fall-Related Injury  Outcome: Discharged to Home

## 2019-01-28 ENCOUNTER — Encounter: Payer: Self-pay | Admitting: Emergency Medicine

## 2019-01-28 ENCOUNTER — Emergency Department
Admission: EM | Admit: 2019-01-28 | Discharge: 2019-01-28 | Disposition: A | Discharge status: Home / Self Care | Payer: Medicaid Other | Attending: Emergency Medicine | Admitting: Emergency Medicine

## 2019-01-28 ENCOUNTER — Other Ambulatory Visit: Payer: Self-pay

## 2019-01-28 DIAGNOSIS — Z79899 Other long term (current) drug therapy: Secondary | ICD-10-CM | POA: Insufficient documentation

## 2019-01-28 DIAGNOSIS — X58XXXA Exposure to other specified factors, initial encounter: Secondary | ICD-10-CM | POA: Diagnosis not present

## 2019-01-28 DIAGNOSIS — S91204A Unspecified open wound of right lesser toe(s) with damage to nail, initial encounter: Secondary | ICD-10-CM | POA: Insufficient documentation

## 2019-01-28 DIAGNOSIS — I10 Essential (primary) hypertension: Secondary | ICD-10-CM | POA: Diagnosis not present

## 2019-01-28 DIAGNOSIS — S91209A Unspecified open wound of unspecified toe(s) with damage to nail, initial encounter: Secondary | ICD-10-CM

## 2019-01-28 DIAGNOSIS — Y939 Activity, unspecified: Secondary | ICD-10-CM | POA: Diagnosis not present

## 2019-01-28 DIAGNOSIS — E119 Type 2 diabetes mellitus without complications: Secondary | ICD-10-CM | POA: Insufficient documentation

## 2019-01-28 DIAGNOSIS — Y929 Unspecified place or not applicable: Secondary | ICD-10-CM | POA: Diagnosis not present

## 2019-01-28 DIAGNOSIS — Y999 Unspecified external cause status: Secondary | ICD-10-CM | POA: Diagnosis not present

## 2019-01-28 DIAGNOSIS — S99921A Unspecified injury of right foot, initial encounter: Secondary | ICD-10-CM | POA: Diagnosis present

## 2019-01-28 DIAGNOSIS — Z794 Long term (current) use of insulin: Secondary | ICD-10-CM | POA: Insufficient documentation

## 2019-01-28 MED ORDER — CLONIDINE HCL 0.2 MG PO TABS
0.30 | ORAL_TABLET | ORAL | Status: DC
Start: 2019-01-26 — End: 2019-01-28

## 2019-01-28 MED ORDER — DEXTROSE 50 % IV SOLN
12.50 | INTRAVENOUS | Status: DC
Start: ? — End: 2019-01-28

## 2019-01-28 MED ORDER — ATORVASTATIN CALCIUM 40 MG PO TABS
40.00 | ORAL_TABLET | ORAL | Status: DC
Start: 2019-01-26 — End: 2019-01-28

## 2019-01-28 MED ORDER — ENOXAPARIN SODIUM 40 MG/0.4ML ~~LOC~~ SOLN
40.00 | SUBCUTANEOUS | Status: DC
Start: 2019-01-27 — End: 2019-01-28

## 2019-01-28 MED ORDER — FUROSEMIDE 40 MG PO TABS
40.00 | ORAL_TABLET | ORAL | Status: DC
Start: 2019-01-27 — End: 2019-01-28

## 2019-01-28 MED ORDER — MELATONIN 3 MG PO TABS
5.00 | ORAL_TABLET | ORAL | Status: DC
Start: 2019-01-26 — End: 2019-01-28

## 2019-01-28 MED ORDER — CEPHALEXIN 500 MG PO CAPS: 500.0000 mg | ORAL_CAPSULE | Freq: Three times a day (TID) | ORAL | 0 refills | Status: DC

## 2019-01-28 MED ORDER — LIDOCAINE HCL (PF) 1 % IJ SOLN
5.0000 mL | Freq: Once | INTRAMUSCULAR | Status: AC
  Administered 2019-01-28: 14:00:00 5 mL
  Filled 2019-01-28: qty 5

## 2019-01-28 MED ORDER — SENNOSIDES 8.6 MG PO TABS
2.00 | ORAL_TABLET | ORAL | Status: DC
Start: 2019-01-26 — End: 2019-01-28

## 2019-01-28 MED ORDER — POLYETHYLENE GLYCOL 3350 17 G PO PACK
17.00 | PACK | ORAL | Status: DC
Start: 2019-01-26 — End: 2019-01-28

## 2019-01-28 MED ORDER — LISINOPRIL 20 MG PO TABS
20.00 | ORAL_TABLET | ORAL | Status: DC
Start: 2019-01-27 — End: 2019-01-28

## 2019-01-28 MED ORDER — CLINDAMYCIN PHOSPHATE 2 % VA CREA
1.00 | TOPICAL_CREAM | VAGINAL | Status: DC
Start: 2019-01-26 — End: 2019-01-28

## 2019-01-28 MED ORDER — HYDRALAZINE HCL 50 MG PO TABS
100.00 | ORAL_TABLET | ORAL | Status: DC
Start: 2019-01-26 — End: 2019-01-28

## 2019-01-28 MED ORDER — OXYCODONE HCL 5 MG PO TABS
10.00 | ORAL_TABLET | ORAL | Status: DC
Start: ? — End: 2019-01-28

## 2019-01-28 MED ORDER — ONDANSETRON 4 MG PO TBDP
8.00 | ORAL_TABLET | ORAL | Status: DC
Start: ? — End: 2019-01-28

## 2019-01-28 MED ORDER — ACETAMINOPHEN 500 MG PO TABS
1000.00 | ORAL_TABLET | ORAL | Status: DC
Start: 2019-01-26 — End: 2019-01-28

## 2019-01-28 MED ORDER — ISOSORBIDE MONONITRATE ER 120 MG PO TB24
120.00 | ORAL_TABLET | ORAL | Status: DC
Start: 2019-01-27 — End: 2019-01-28

## 2019-01-28 MED ORDER — INSULIN LISPRO 100 UNIT/ML ~~LOC~~ SOLN
0.00 | SUBCUTANEOUS | Status: DC
Start: 2019-01-26 — End: 2019-01-28

## 2019-01-28 MED ORDER — CARVEDILOL 25 MG PO TABS
25.00 | ORAL_TABLET | ORAL | Status: DC
Start: 2019-01-26 — End: 2019-01-28

## 2019-01-28 NOTE — Discharge Instructions (Signed)
Clean area daily with mild soap and water.  Watch for any signs of infection.  Begin taking Keflex 500 mg 3 times daily for the next 5 days for prevention of infection.  You may follow-up with your primary care provider if any continued concerns.  Also the name of the podiatrist on-call is Dr. Luana Shu who is in the [facility].  You should have your primary care doctor call and get an appointment for diabetic foot care.

## 2019-01-28 NOTE — ED Triage Notes (Signed)
Pt reports second toe on right foot toenail is coming off. Pt denies injuries, states that it happened last night and she thinks it was caught on the covers. No bleeding noted.

## 2019-01-28 NOTE — ED Notes (Signed)
See triage note  States she thinks she caught her toe last pm    Pain to right 2 nd toe

## 2019-01-28 NOTE — ED Provider Notes (Signed)
[facility] Emergency Department Provider Note  ____________________________________________   First MD Initiated Contact with Patient 01/28/19 1224     (approximate)  I have reviewed the triage vital signs and the nursing notes.   HISTORY  Chief Complaint Toe Pain    HPI Margaret Herrera is a 58 y.o. female presents to the ED with complaint of right second toenail coming off.  Patient believes that it was caught in the bed sheets last evening and was torn partially off.  She denies any other injury.  She rates her pain as a 0/10.       Past Medical History:  Diagnosis Date  . Diabetes mellitus without complication ([place])   . Hypertension     Patient Active Problem List   Diagnosis Date Noted  . Chest pain 03/20/2017    Past Surgical History:  Procedure Laterality Date  . ABDOMINAL HYSTERECTOMY     partial    Prior to Admission medications   Medication Sig Start Date End Date Taking? Authorizing Provider  cephALEXin (KEFLEX) 500 MG capsule Take 1 capsule (500 mg total) by mouth 3 (three) times daily. 01/28/19   Johnn Hai, PA-C  furosemide (LASIX) 40 MG tablet Take 40 mg by mouth daily.    [provider]  hydrALAZINE (APRESOLINE) 100 MG tablet Take 100 mg by mouth 3 (three) times daily. 09/17/15   [provider]  hydrochlorothiazide (HYDRODIURIL) 25 MG tablet Take 25 mg by mouth daily.    [provider]  insulin detemir (LEVEMIR) 100 UNIT/ML injection Inject 0.35 mLs (35 Units total) into the skin at bedtime. 03/21/17   Dustin Flock, MD  isosorbide mononitrate (IMDUR) 60 MG 24 hr tablet Take 60 mg by mouth daily.    [provider]  JANUVIA 100 MG tablet Take 100 mg by mouth daily. 09/11/15   [provider]  lisinopril (PRINIVIL,ZESTRIL) 20 MG tablet Take 20 mg by mouth daily.     [provider]  metoprolol (LOPRESSOR) 100 MG tablet Take 100 mg by mouth 2 (two) times daily.     [provider]    Allergies Patient has no known allergies.  Family History  Problem Relation Age of Onset  . Diabetes Mother     Social History Social History   Tobacco Use  . Smoking status: Never Smoker  . Smokeless tobacco: Never Used  Substance Use Topics  . Alcohol use: No  . Drug use: No    Review of Systems Constitutional: No fever/chills Cardiovascular: Denies chest pain. Respiratory: Denies shortness of breath. Musculoskeletal: Right second toe pain.  Partial nail avulsion. Skin: Negative for rash. Neurological: Negative for headaches, focal weakness or numbness. ___________________________________________   PHYSICAL EXAM:  VITAL SIGNS: ED Triage Vitals  Enc Vitals Group     BP 01/28/19 1150 136/68     Pulse Rate 01/28/19 1150 82     Resp --      Temp 01/28/19 1150 98.7 F (37.1 C)     Temp Source 01/28/19 1150 Oral     SpO2 01/28/19 1150 100 %     Weight 01/28/19 1133 175 lb (79.4 kg)     Height 01/28/19 1133 5\' 2"  (1.575 m)     Head Circumference --      Peak Flow --      Pain Score 01/28/19 1132 0     Pain Loc --      Pain Edu? --      Excl. in  GC? --    Constitutional: Alert and oriented. Well appearing and in no acute distress. Eyes: Conjunctivae are normal.  Head: Atraumatic. Neck: No stridor.   Cardiovascular: Normal rate, regular rhythm. Grossly normal heart sounds.  Good peripheral circulation. Respiratory: Normal respiratory effort.  No retractions. Lungs CTAB. Musculoskeletal: Examination of the right foot the second digit has a nail that is extremely long and partially avulsed.  No active bleeding was seen.  No tenderness or evidence of injury to the toe itself. Neurologic:  Normal speech and language. No gross focal neurologic deficits are appreciated. No gait instability. Skin:  Skin is warm, dry and intact.  Psychiatric: Mood and affect are normal. Speech and behavior are normal.   ____________________________________________   LABS (all labs ordered are listed, but only abnormal results are displayed)  Labs Reviewed - No data to display  PROCEDURES  Procedure(s) performed (including Critical Care):  .Nail Removal  Date/Time: 01/28/2019 12:50 PM Performed by: Johnn Hai, PA-C Authorized by: Johnn Hai, PA-C   Consent:    Consent obtained:  Verbal   Consent given by:  Patient   Risks discussed:  Pain and infection Location:    Foot:  R second toe Pre-procedure details:    Skin preparation:  Alcohol Anesthesia (see MAR for exact dosages):    Anesthesia method:  Nerve block   Block needle gauge:  25 G   Block anesthetic:  Lidocaine 1% w/o epi   Block injection procedure:  Anatomic landmarks identified, introduced needle, incremental injection and negative aspiration for blood   Block outcome:  Anesthesia achieved Nail Removal:    Nail removed:  Complete Post-procedure details:    Dressing:  Non-adhesive packing strip   Patient tolerance of procedure:  Tolerated well, no immediate complications   ____________________________________________   INITIAL IMPRESSION / ASSESSMENT AND PLAN / ED COURSE  As part of my medical decision making, I reviewed the following data within the electronic MEDICAL RECORD NUMBER Notes from prior ED visits and [place] Controlled Substance Database  58 year old female presents to the ED with complaint of right second toenail being partially avulsed last night while she was sleeping.  She denies any direct trauma to her toe.  On exam nail is partially avulsed.  Patient tolerated the procedure well and toe nail was removed completely.  Patient is diabetic and was given a prescription for Keflex 500 mg 3 times daily for 5 days to prevent infection.  She was also told to clean with mild soap and water daily.  She is to follow-up with Dr. Luana Shu who is the podiatrist on call today for diabetic foot care.   ____________________________________________   FINAL CLINICAL IMPRESSION(S) / ED DIAGNOSES  Final diagnoses:  Nail avulsion of toe, initial encounter     ED Discharge Orders         Ordered    cephALEXin (KEFLEX) 500 MG capsule  3 times daily     01/28/19 1329           Note:  This document was prepared using Dragon voice recognition software and may include unintentional dictation errors.    Johnn Hai, PA-C 01/28/19 1503    Carrie Mew, MD 01/29/19 1349

## 2019-01-29 DIAGNOSIS — C801 Malignant (primary) neoplasm, unspecified: Principal | ICD-10-CM

## 2019-01-30 DIAGNOSIS — C50919 Malignant neoplasm of unspecified site of unspecified female breast: Principal | ICD-10-CM

## 2019-01-30 DIAGNOSIS — C7951 Secondary malignant neoplasm of bone: Principal | ICD-10-CM

## 2019-01-30 NOTE — Unmapped (Signed)
Return Patient Evaluation    Referring Physician: Alveda Reasons, *    PCP: Tyson Babinski [facility]    Consulting Physicians: Surgical oncology: NA.  Radiation oncology: Eliberto Ivory.    Reason for Visit: The patient is seen in consultation at the request of Dr. Alveda Reasons for evaluation of breast cancer.    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  First line: Planning Abraxane atezolizumab if PDL 1+.  Capecitabine if not.  Second line: Clinical trial or TBD.    Genomics: Harmony and strata pending.  PDL 1 pending.  Bone directed therapy: Plan Xgeva or Zometa.  Radiation: 12/21/2018??? 01/02/2019: Palliative radiation to pelvis/proximal femur  -----------------------------------------------------------------------------------------------------  Assessment: The following issues were discussed: Alexandra Richards is a 58 y.o. 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 We discussed that MBC is not curable with current technologies but is treatable. Will need distant (likely bone biopsy) at some point. Baseline bone scan with uptake in spine, left hemisacrum + path fracture (received RT) and uptake in left rib, right pubic ramus, and bilateral femora. Plan to treat with SOC Abraxane + atezolizumab however PDL1 testing was negative, therefore will will plan to treat with Capecitabine. Currently no trials and first-line triple negative setting.     She is here today to discuss systemic therapy, unfortunately PDL1 was negative and therefore we would recommend starting with capecitabine. Given her baseline Cr/CKD will plan to start at 1500 mg BID for 14 days on and 7 days off.     She was admitted 10/28 to 10/31 for constipation > 10 days, CT scan showed possible colitis. She was discharged on a bowel regimen of Mirilax + senna BID. Also found to have BV and was given clindamycin x 7 days (suppositories). Her cape was held during inpatient admission. I suspect the constipation was from oxy + Zofran several times a day.  She denies nausea and has therefore stopped taking Zofran daily, now only using as needed.  She continues to take the oxycodone which is controlling her pain about 3 times a day.  She has only been taking senna nightly plus MiraLAX twice a day, she has only had 1 bowel movement since discharge in the last 6 days.  I sent in mag citrate for her to take as she feels she is constipated again, and encouraged her to increase senna 2 tablets twice a day + MiraLAX twice a day.  I also encouraged her to call if she was not having regular bowel movements by early next week.    She has been off the Xeloda since 10/28, she completed ~12 days of her first cycle prior to hospital admission.  She tells me that she tolerated the Xeloda well and denies any issues with nausea/vomiting, loss of appetite, diarrhea, or hand-foot syndrome.  She developed nausea on the day she presented to the emergency room after being constipated for 10 days.  Unfortunately today her creatinine is elevated at 2.14, she appears to have fluctuated between 1.4-2 in the past.  Her creatinine clearance is less than 30 therefore she will need to hold and we will need to recheck labs in 1 week.  I encouraged her to continue to push fluids.     We will have her return next week for repeat labs and evaluation of restarting capecitabine.    Plan    1.  Metastatic left breast cancer  >Systemic therapy: Plan for capecitabine  since PDL1 negative, DR d/t CKD - 1500 mg BID for 14 days on and 7 days off  >Palliative Radiation: 12/21/2018??? 01/02/2019: Palliative radiation to pelvis/proximal femur  >Genetic/Genomic: HARMONY and STRATA - no actionable mutations.   >Imaging: 12/16/2018: CT CAP bone and centimeters/subcentimeter pulmonary mets.  Bone scan with several areas of bone lesions.   >Bone Directed therapy: Dental clearance inpatient. [name] on 10/15, continue q6w (to time with 3 week visits).  >Vasc Access: Will need port at some point.  >Tumor Markers: none elevated     Comorbidities  > DM: On insulin, hyperglycemia on recent admission. Increased NPH. BS have been 200 or less, no hypoglycemia, has f/u with PCP next month who manages insulin.  > HTN: difficult to manage and on multiple meds.    > CKD: Cr today elevated at 2.14, Cr Cl <30 so unable to resume Xeloda. Will recheck next week. Referred to nephrology.  > HFpEF: Attention to fluid status. On lasix 40 mg daily.     Supportive care  Bone pain/Arthalgias: Controlled with APAP and oxycodone 10 mg, did not have enough oxy so pain today 9/10. Refilled oxy 10 mg #120 tablets to take q6h PRN.   Fatigue: Mild to moderate currently.  Anorexia: Intermittently good - better with control of nausea.  Neuropathy: Mild at baseline.  Has DM.  Constipation: Admitted for constipation > 10 days.  Discharged after having several BMs.  She has been taking MiraLAX twice a day + senna 2 tablets at night, has only had 1 small BM.  Given constipation sent in mag citrate for her to take today.  Encouraged to take MiraLAX 2-3 times a day + senna 2 tablets twice a day.  Nausea: admitted for intractable N/V.  She denies any nausea, aside from when she presented to the ED with constipation.  We will have her use Compazine with Xeloda, only use antiemetics otherwise PRN.    Follow-up  --Hold Xeloda given elevated creatinine  --Return in 1 week for repeat labs  -----------------------------------------------------------------------------------------------------  No chief complaint on file.      History of the Present Illness: Alexandra Richards is a pleasant 58 y.o. female who  has a past medical history of Diabetes mellitus (CMS-HCC), HLD (hyperlipidemia), Hypertension, Non-intractable vomiting with nausea (01/04/2019), and Renal disorder. She is seen in consultation at the request of Dr. Alveda Reasons for evaluation of breast cancer.    Briefly she has a history of refractory HTN, HLD, HFpEF, CKD, IDDM and had recent ED visits/admissions. She developed right breast soreness and progressive pain pelvic and hip pain. Found to have a left medial breast mass. Biopsy confirmed high-grade carcinoma. ER/PR and HER-2 negative thus essentially triple negative breast cancer. Staging CT showed diffuse osseous mets with pathological fracture of the sacrum andcentimeters and subcentimeter pulmonary nodules. No other visceral or liver mets.  She was seen by orthopedics who recommended nonoperative intervention.  She received 2 fractions of palliative radiation.No FH of breast or ovarian cancer.    Interval History:  Here today for follow-up on Xeloda, started on 01/12/2019. Here today accompanied by her son.  She was admitted from 10/28???10/31 for nausea plus constipation, no bowel movement within 10 days.  She was provided enemas and mag citrate along with aggressive bowel regimen and had multiple BMs.  She was discharged on bowel regimen.    --Since hospital discharge she has had 1 small BM.  She has been taking MiraLAX twice a day + Senokot 2  tablets at night.  She has been passing gas.  She reports some abdominal cramping that feels like I need to go to the bathroom.  --She had nausea on the day she presented to the emergency department, otherwise she denied any nausea.  She questioned why she needed to continue the Zofran daily and instead is only been taking as needed with no nausea.  --She also presented to the emergency room on 11/2 after working part of her toenail off.  The remainder of the toenail was removed in the ED and she was discharged home on Keflex 3 times daily for 5 days.  I examined her toe today which does not look concerning for infection, I encouraged her to clean and change the dressing daily, she was still wearing the same dressing from 2 days ago in the emergency department.  --Pain has been well controlled, denies any new pain outside of the persistent pain in her hip/pelvis.  She has been able to be more mobile, is only needing the oxycodone 2-3 times a day with well-controlled pain.  --She denies any hand-foot syndrome, I again encouraged her to use lotion frequently on hands and feet  --Blood pressure remains well controlled      Review of Systems: A complete twelve systems review was obtained and is positive per the HPI but otherwise negative in detail. See MIMS #1170 where available.    Functional Status: ECOG PS 1???2.  Independent with some ADLs and all IADLs.  Ambulates with walker due to pain.  Cognitively intact.    Oncology History   Malignant neoplasm of lower-inner quadrant of left breast in female, estrogen receptor negative (CMS-HCC)   11/2018 Initial Diagnosis    Malignant neoplasm of lower-inner quadrant of left breast in female, estrogen receptor negative (CMS-HCC)     12/16/2018 Interval Scan(s)    CT Chest:   --3.8 cm left medial breast mass with associated soft tissue stranding, concerning for breast malignancy.  -- Prominent left axillary lymphadenopathy measuring up to 1.6 cm, concerning for nodal metastatic involvement.  -- Numerous bilateral pulmonary nodules throughout all lung lobes, measuring up to 1.0 cm, concerning for metastatic disease.  -- Numerous lytic osseous lesions throughout the axial and appendicular skeleton, consistent with osseous metastatic disease.    CT A/P  --Numerous lytic lesions of the pelvis and proximal femurs, with pathologic fractures of the bilateral sacrum extending into the sacral foramina.     11/2018 -  Presenting Symptoms    Developed right breast soreness and progressive pain pelvic and hip pain.      12/18/2018 Biopsy    Left breast 3:00 core needle biopsy.  Grade 3 ER negative, PR negative, HER-2 negative by IHC invasive ductal carcinoma.     12/21/2018 - 01/02/2019 Radiation    Palliative radiation to 3000 cGy at 300 cGy/fraction for a total of 10 fractions.      01/10/2019 - 01/10/2019 Chemotherapy    OP BREAST ABRAXANE ATEZOLIZUMAB  atezolizumab 840 mg IV on days 1, 15, nab-PACLitaxel 100 mg/m2 IV on days 1, 8, 15, every 28 days until disease progression or DLT         Patient Active Problem List   Diagnosis   ??? Hyperglycemia   ??? Fatigue   ??? Type 2 diabetes mellitus, with long-term current use of insulin (CMS-HCC)   ??? Anemia   ??? Hyponatremia   ??? Bilateral leg edema   ??? Hypokalemia   ??? Hypomagnesemia   ??? Essential hypertension   ???  CKD (chronic kidney disease)   ??? Elevated TSH   ??? Hyperkalemia   ??? (HFpEF) heart failure with preserved ejection fraction (CMS-HCC)   ??? Hypoglycemia   ??? Weakness of both lower extremities   ??? AKI (acute kidney injury) (CMS-HCC)   ??? 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 Malignant neoplasm of lower-inner quadrant of left breast in female, estrogen receptor negative (CMS-HCC)   ??? Constipation   ??? Decreased appetite       Past Medical History:   Diagnosis Date   ??? Diabetes mellitus (CMS-HCC)    ??? HLD (hyperlipidemia)    ??? Hypertension    ??? Non-intractable vomiting with nausea 01/04/2019   ??? Renal disorder        No past surgical history on file.    Gyn History:     Medications:  Current Outpatient Medications   Medication Sig Dispense Refill   ??? acetaminophen (TYLENOL) 325 MG tablet Take 650 mg by mouth every six (6) hours as needed.      ??? amLODIPine (NORVASC) 5 MG tablet Take 2 tablets (10 mg total) by mouth daily.     ??? atorvastatin (LIPITOR) 40 MG tablet Take 40 mg by mouth nightly.      ??? blood sugar diagnostic (ON CALL EXPRESS TEST STRIP) Strp Use to check blood sugar by Other route Four (4) times a day (before meals and nightly). 100 strip 0   ??? capecitabine (XELODA) 500 MG tablet Take 3 tablets (1,500 mg total) by mouth Two (2) times a day . For 14 days on then 7 days off 84 tablet 1   ??? carvediloL (COREG) 25 MG tablet Take 1 tablet (25 mg total) by mouth Two (2) times a day. 60 tablet 0   ??? cholecalciferol, vitamin D3, 1,000 unit (25 mcg) tablet Take 2 tablets (2,000 Units total) by mouth daily. 60 tablet 11   ??? clindamycin (CLEOCIN) 2 % vaginal cream Insert 1 applicator into the vagina nightly for 5 days. 25 g 0   ??? cloNIDine HCL (CATAPRES) 0.3 MG tablet Take 0.3 mg by mouth Two (2) times a day.      ??? furosemide (LASIX) 40 MG tablet Take 40 mg by mouth daily. Take additional 40 mg if weight gain >3 lb in 1 day or leg swelling     ??? hydrALAZINE (APRESOLINE) 100 MG tablet Take 1 tablet (100 mg total) by mouth Three (3) times a day. 90 tablet 0   ??? insulin NPH-insulin regular, 70/30, (HUMULIN/NOVOLIN) 100 unit/mL (70-30) injection Inject 0.15 mL (15 Units total) under the skin Two (2) times a day (30 minutes before a meal). Can increase by 5 units if glucose is greater than 140. 9 mL 0   ??? isosorbide mononitrate (IMDUR) 60 MG 24 hr tablet Take 2 tablets (120 mg total) by mouth daily. 180 tablet 0   ??? lisinopriL (PRINIVIL,ZESTRIL) 20 MG tablet Take 20 mg by mouth daily.     ??? melatonin 5 mg Tab Take 5 mg by mouth nightly.     ??? mirtazapine (REMERON) 15 MG tablet Take 1 tablet (15 mg total) by mouth nightly. For sleep and appetite; 30 tablet 2   ??? ondansetron (ZOFRAN-ODT) 4 MG disintegrating tablet Take 1 tablet (4 mg total) by mouth every six (6) hours as needed for nausea. 30 tablet 0   ??? oxyCODONE (ROXICODONE) 10 mg immediate release tablet Take 1 tablet (  10 mg total) by mouth every six (6) hours as needed for pain. 90 tablet 0   ??? polyethylene glycol (MIRALAX) 17 gram/dose powder Take 17 g by mouth Two (2) times a day. 3060 g 0   ??? promethazine (PHENERGAN) 25 MG suppository Insert 1 suppository (25 mg total) into the rectum every eight (8) hours as needed for nausea (vomiting) for up to 30 doses. 30 suppository 0   ??? senna (SENOKOT) 8.6 mg tablet Take 2 tablets by mouth Two (2) times a day. 120 tablet 2     No current facility-administered medications for this visit.        Allergies:  No Known Allergies    Family History: Cancer-related family history is not on file.    Social History:   Social History     Social History Narrative    Lives alone in [place].  Never married.  3 children (1 son and 2 daughters).  Son live in [place] and she plans to move to an apartment near him. 23 grandchildren.   Former smoker.  Half [employer] x5-10 years.  Quit in 85s.  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  Associates degree in computer programming and bachelors in hospitality/tourism.       Physical Examination:   Vital Signs: There were no vitals taken for this visit.  General: Healthy-appearing patient, mild distress 2/2 left hip pain  HEENT: PERRTLA, EOMI, OP clear  Cardiovascular: Normal S1 and S2. RRR. No audible murmurs.  Respiratory: Chest clear to percussion and auscultation.   Gastrointestinal: Abdomen soft without masses and tenderness, no hepatosplenomegaly or other masses.  Normal bowel sounds.  Musculoskeletal: No bony pain or tenderness. ++ pain overlying left hip   Skin: No rash, ecchymoses or purpuric lesions noted. ++ Right third toe nail removed with granulation tissue present.  No concern for infection.  Breasts: Exam deferred today.  Neurologic:  Alert and oriented. Grossly non-focal  Lymphatic: No cervical, axillary or supraclavicular adenopathy.   Extremity: No lower extremity edema or upper extremity lymphedema    DATA REVIEW:    Laboratory: I personally reviewed the pertinent laboratory data.    Radiology: I personally reviewed the pertinent imaging.    Pathology: I personally reviewed the pathology report.

## 2019-01-31 ENCOUNTER — Encounter: Admit: 2019-01-31 | Discharge: 2019-01-31 | Payer: MEDICAID

## 2019-01-31 ENCOUNTER — Ambulatory Visit: Admit: 2019-01-31 | Discharge: 2019-01-31 | Payer: MEDICAID | Attending: Adult Health | Primary: Adult Health

## 2019-01-31 DIAGNOSIS — C50919 Malignant neoplasm of unspecified site of unspecified female breast: Principal | ICD-10-CM

## 2019-01-31 DIAGNOSIS — N189 Chronic kidney disease, unspecified: Principal | ICD-10-CM

## 2019-01-31 DIAGNOSIS — C7951 Secondary malignant neoplasm of bone: Secondary | ICD-10-CM

## 2019-01-31 LAB — COMPREHENSIVE METABOLIC PANEL
ALBUMIN: 3.7 g/dL (ref 3.5–5.0)
ALKALINE PHOSPHATASE: 68 U/L (ref 38–126)
ANION GAP: 10 mmol/L (ref 7–15)
AST (SGOT): 22 U/L (ref 14–38)
BILIRUBIN TOTAL: 0.3 mg/dL (ref 0.0–1.2)
BLOOD UREA NITROGEN: 24 mg/dL — ABNORMAL HIGH (ref 7–21)
BUN / CREAT RATIO: 11
CALCIUM: 9.7 mg/dL (ref 8.5–10.2)
CHLORIDE: 104 mmol/L (ref 98–107)
CO2: 26 mmol/L (ref 22.0–30.0)
CREATININE: 2.14 mg/dL — ABNORMAL HIGH (ref 0.60–1.00)
EGFR CKD-EPI AA FEMALE: 28 mL/min/{1.73_m2} — ABNORMAL LOW (ref >=60)
EGFR CKD-EPI NON-AA FEMALE: 24 mL/min/{1.73_m2} — ABNORMAL LOW (ref >=60)
GLUCOSE RANDOM: 51 mg/dL — ABNORMAL LOW (ref 70–179)
POTASSIUM: 4.5 mmol/L (ref 3.5–5.0)
PROTEIN TOTAL: 6.9 g/dL (ref 6.5–8.3)
SODIUM: 140 mmol/L (ref 135–145)

## 2019-01-31 LAB — CBC W/ AUTO DIFF
BASOPHILS ABSOLUTE COUNT: 0 10*9/L (ref 0.0–0.1)
BASOPHILS RELATIVE PERCENT: 0.5 %
EOSINOPHILS ABSOLUTE COUNT: 0.2 10*9/L (ref 0.0–0.4)
HEMATOCRIT: 28.7 % — ABNORMAL LOW (ref 36.0–46.0)
HEMOGLOBIN: 9.6 g/dL — ABNORMAL LOW (ref 13.5–16.0)
LARGE UNSTAINED CELLS: 2 % (ref 0–4)
LYMPHOCYTES ABSOLUTE COUNT: 0.7 10*9/L — ABNORMAL LOW (ref 1.5–5.0)
LYMPHOCYTES RELATIVE PERCENT: 8.1 %
MEAN CORPUSCULAR HEMOGLOBIN CONC: 33.3 g/dL (ref 31.0–37.0)
MEAN CORPUSCULAR HEMOGLOBIN: 30.8 pg (ref 26.0–34.0)
MEAN CORPUSCULAR VOLUME: 92.7 fL (ref 80.0–100.0)
MEAN PLATELET VOLUME: 7.6 fL (ref 7.0–10.0)
MONOCYTES ABSOLUTE COUNT: 0.8 10*9/L (ref 0.2–0.8)
MONOCYTES RELATIVE PERCENT: 9.6 %
NEUTROPHILS ABSOLUTE COUNT: 6.5 10*9/L (ref 2.0–7.5)
NEUTROPHILS RELATIVE PERCENT: 77.8 %
PLATELET COUNT: 468 10*9/L — ABNORMAL HIGH (ref 150–440)
WBC ADJUSTED: 8.4 10*9/L (ref 4.5–11.0)

## 2019-01-31 LAB — ALBUMIN: Albumin:MCnc:Pt:Ser/Plas:Qn:: 3.7

## 2019-01-31 LAB — LYMPHOCYTES ABSOLUTE COUNT: Lymphocytes:NCnc:Pt:Bld:Qn:Automated count: 0.7 — ABNORMAL LOW

## 2019-01-31 MED ORDER — MAGNESIUM CITRATE ORAL SOLUTION
Freq: Once | ORAL | 0 refills | 1 days | Status: CP
Start: 2019-01-31 — End: 2019-01-31

## 2019-01-31 NOTE — Unmapped (Signed)
It was a pleasure to see you today in the Medical Oncology Clinic.    Please call our nurse navigator, Modena Jansky, if you have any interval questions or concerns:      Modena Jansky, RN: phone: [phone]     Prescription refills: [phone]     FAX: [phone]   ----------------------------------------------------------------------------------------------------  For health related questions call:   Please call NURSE TRIAGE LINE at [phone]    For appointments & questions Monday through Friday 8 AM???5 PM     Please call [phone] or Toll free [phone]    On Lovenia Kim and Holidays  Call [phone] and ask for the oncologist on call    For emergencies, evenings or weekends, please call [phone] and ask for the oncology fellow on call.     Reasons to call emergency line may include:   Fever of 100.5 or greater   Nausea and/or vomiting not relieved with nausea medicine   Diarrhea or constipation not relieved with bowel regimen   Severe pain not relieved with usual pain regimen     --------------------------------------------------------------------------------------------------------  Given the rapidly spreading coronavirus (COVID-19), we recommended you avoid travel and crowds, and wash your hands frequently.     If you develop fever, cough, shortness of breath and/or have known exposure (close contact < 6 feet) with someone who has tested positive please call the [facility] Coronavirus Helpline at [phone].         Carlos American, DNP, NP-C  Breast Medical Oncology  [facility] Hematology/Oncology  [address]  [place], [place] [address]

## 2019-02-01 NOTE — Unmapped (Signed)
Patient Alexandra Richards was contacted today regarding her 11.12 appointment. Confirmed appointment with patient.

## 2019-02-05 DIAGNOSIS — G893 Neoplasm related pain (acute) (chronic): Principal | ICD-10-CM

## 2019-02-05 DIAGNOSIS — C50919 Malignant neoplasm of unspecified site of unspecified female breast: Principal | ICD-10-CM

## 2019-02-05 DIAGNOSIS — C7951 Secondary malignant neoplasm of bone: Principal | ICD-10-CM

## 2019-02-05 NOTE — Unmapped (Signed)
Call to [employer] in [place], pt lives in [place]. They do take pts insurance medicaid and they do supply wheelchairs. Will fax order and pt info tomorrow when in office to [phone]. EMR

## 2019-02-06 NOTE — Unmapped (Signed)
Faxed wheelchair order, progress note and demographics to [facility]. EMR

## 2019-02-07 ENCOUNTER — Encounter: Admit: 2019-02-07 | Discharge: 2019-02-07 | Payer: MEDICAID

## 2019-02-07 ENCOUNTER — Encounter: Admit: 2019-02-07 | Discharge: 2019-02-07 | Payer: MEDICAID | Attending: Adult Health | Primary: Adult Health

## 2019-02-07 DIAGNOSIS — C7951 Secondary malignant neoplasm of bone: Secondary | ICD-10-CM

## 2019-02-07 DIAGNOSIS — C50919 Malignant neoplasm of unspecified site of unspecified female breast: Principal | ICD-10-CM

## 2019-02-07 LAB — COMPREHENSIVE METABOLIC PANEL
ALBUMIN: 3.3 g/dL — ABNORMAL LOW (ref 3.5–5.0)
ALBUMIN: 3.3 g/dL — ABNORMAL LOW (ref 3.5–5.0)
ALKALINE PHOSPHATASE: 68 U/L (ref 38–126)
ALKALINE PHOSPHATASE: 66 U/L (ref 38–126)
ALT (SGPT): 12 U/L (ref <35)
ALT (SGPT): 11 U/L (ref <35)
ANION GAP: 9 mmol/L (ref 7–15)
ANION GAP: 8 mmol/L (ref 7–15)
AST (SGOT): 22 U/L (ref 14–38)
BILIRUBIN TOTAL: 0.4 mg/dL (ref 0.0–1.2)
BILIRUBIN TOTAL: 0.4 mg/dL (ref 0.0–1.2)
BLOOD UREA NITROGEN: 25 mg/dL — ABNORMAL HIGH (ref 7–21)
BLOOD UREA NITROGEN: 23 mg/dL — ABNORMAL HIGH (ref 7–21)
BUN / CREAT RATIO: 12
BUN / CREAT RATIO: 12
CALCIUM: 9.4 mg/dL (ref 8.5–10.2)
CALCIUM: 9 mg/dL (ref 8.5–10.2)
CHLORIDE: 104 mmol/L (ref 98–107)
CO2: 25 mmol/L (ref 22.0–30.0)
CO2: 25 mmol/L (ref 22.0–30.0)
CREATININE: 2.11 mg/dL — ABNORMAL HIGH (ref 0.60–1.00)
CREATININE: 1.92 mg/dL — ABNORMAL HIGH (ref 0.60–1.00)
EGFR CKD-EPI AA FEMALE: 32 mL/min/{1.73_m2} — ABNORMAL LOW (ref >=60)
EGFR CKD-EPI NON-AA FEMALE: 28 mL/min/{1.73_m2} — ABNORMAL LOW (ref >=60)
GLUCOSE RANDOM: 145 mg/dL (ref 70–179)
GLUCOSE RANDOM: 69 mg/dL — ABNORMAL LOW (ref 70–179)
POTASSIUM: 4.8 mmol/L (ref 3.5–5.0)
POTASSIUM: 4.6 mmol/L (ref 3.5–5.0)
PROTEIN TOTAL: 6.3 g/dL — ABNORMAL LOW (ref 6.5–8.3)
PROTEIN TOTAL: 6.2 g/dL — ABNORMAL LOW (ref 6.5–8.3)
SODIUM: 138 mmol/L (ref 135–145)
SODIUM: 138 mmol/L (ref 135–145)

## 2019-02-07 LAB — PHOSPHORUS: Phosphate:MCnc:Pt:Ser/Plas:Qn:: 4.9 — ABNORMAL HIGH

## 2019-02-07 LAB — CALCIUM: Calcium:MCnc:Pt:Ser/Plas:Qn:: 9.2

## 2019-02-07 LAB — EGFR CKD-EPI NON-AA FEMALE: Lab: 25 — ABNORMAL LOW

## 2019-02-07 LAB — CREATININE: Creatinine:MCnc:Pt:Ser/Plas:Qn:: 1.92 — ABNORMAL HIGH

## 2019-02-07 NOTE — Unmapped (Signed)
It was a pleasure to see you today in the Medical Oncology Clinic.    Please call our nurse navigator, Modena Jansky, if you have any interval questions or concerns:      Modena Jansky, RN: phone: [phone]     Prescription refills: [phone]     FAX: [phone]   ----------------------------------------------------------------------------------------------------  For health related questions call:   Please call NURSE TRIAGE LINE at [phone]    For appointments & questions Monday through Friday 8 AM???5 PM     Please call [phone] or Toll free [phone]    On Lovenia Kim and Holidays  Call [phone] and ask for the oncologist on call    For emergencies, evenings or weekends, please call [phone] and ask for the oncology fellow on call.     Reasons to call emergency line may include:   Fever of 100.5 or greater   Nausea and/or vomiting not relieved with nausea medicine   Diarrhea or constipation not relieved with bowel regimen   Severe pain not relieved with usual pain regimen     --------------------------------------------------------------------------------------------------------  Given the rapidly spreading coronavirus (COVID-19), we recommended you avoid travel and crowds, and wash your hands frequently.     If you develop fever, cough, shortness of breath and/or have known exposure (close contact < 6 feet) with someone who has tested positive please call the [facility] Coronavirus Helpline at [phone].         Alexandra American, DNP, NP-C  Breast Medical Oncology  [facility]emat[facility]/Oncology  [address]  [place], [place] [address]

## 2019-02-07 NOTE — Unmapped (Signed)
Return Patient Evaluation    Referring Physician: [facility], Pr*    PCP: [facility]    Consulting Physicians: Surgical oncology: NA.  Radiation oncology: Alexandra Richards.    Reason for Visit: The patient is seen in consultation at the request of Dr. Redmond Pulling Richards* for evaluation of breast cancer.    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  First line: Planning Abraxane atezolizumab if PDL 1+.  Capecitabine if not.  Second line: Clinical trial or TBD.    Genomics: Harmony and strata pending.  PDL 1 pending.  Bone directed therapy: Plan Xgeva or Zometa.  Radiation: 12/21/2018??? 01/02/2019: Palliative radiation to pelvis/proximal femur  -----------------------------------------------------------------------------------------------------  Assessment: The following issues were discussed: Alexandra Richards is a 58 y.o. 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 We discussed that MBC is not curable with current technologies but is treatable. Will need distant (likely bone biopsy) at some point. Baseline bone scan with uptake in spine, left hemisacrum + path fracture (received RT) and uptake in left rib, right pubic ramus, and bilateral femora. Plan to treat with SOC Abraxane + atezolizumab however PDL1 testing was negative, therefore will will plan to treat with Capecitabine. Currently no trials and first-line triple negative setting.     She is here today to discuss systemic therapy, unfortunately PDL1 was negative and therefore we would recommend starting with capecitabine. Given her baseline Cr/CKD will plan to start at 1500 mg BID for 14 days on and 7 days off. Of note for calculating CrCl: lean mass should be used rather then total body weight. Some would use Ideal BWT (50 kg for her) or use adjusted BWT (67 kg). Then for CrCl equation would use 140-60 (67)/72*Cr x .85 She was seen last week, unfortunately her creatinine was elevated at 2.14, she appears to have fluctuated between 1.4-2 in the past.  Her creatinine clearance was less than 30, so Xeloda was held. Here today to recheck creatinine levels prior to restarting. Today her Cr level is 2.11. In review it appears she got contrast with CT scan on 10/28, which may have caused contrast nephropathy, though did not have hydronephrosis on recent imaging. Will plan to give IV fluids today, and recheck CMP. If Cr improved then can consider re-starting Xeloda. We also have her scheduled with [facility] on 11/16 for w/u and management of her CKD.     She has also continued to struggle with constipation, she was hospitalized recently for constipation + colitis. Last visit we discussed using Mirilax + senna 2 tablets BID, especially in the setting of taking narcotics daily for pain management. At last visit she had not had a BM in 4 days so she was provided with magnesium citrate to take as that was most effective when she was in the hospital in producing a BM. She is still not having regular BM's, we discussed increasing Mirilax to TID. No longer with nausea so not requiring Zofran. Still with pain in her pelvis radiation to both legs. She is using oxycodone 10 mg TID + alternating with tylenol. She tells me today she did not take any pain medication, gave a dose of 10 mg in clinic     She will get IV fluids in infusion today, after which will repeat CMP. If Cr not improved then will wait until she see's nephrology for further work-up. Will restart Xeloda once CrCl > 30.     Plan  1.  Metastatic left breast cancer  >Systemic therapy: Plan for capecitabine since PDL1 negative, DR d/t CKD - 1500 mg BID for 14 days on and 7 days off  >Palliative Radiation: 12/21/2018??? 01/02/2019: Palliative radiation to pelvis/proximal femur  >Genetic/Genomic: HARMONY and STRATA - no actionable mutations. >Imaging: 12/16/2018: CT CAP bone and centimeters/subcentimeter pulmonary mets.  Bone scan with several areas of bone lesions.   >Bone Directed therapy: Dental clearance inpatient. [name] on 10/15, continue q6w (to time with 3 week visits).  >Vasc Access: Will need port at some point.  >Tumor Markers: none elevated     Comorbidities  > DM: On insulin, hyperglycemia on recent admission. Increased NPH. BS have been 200 or less, no hypoglycemia, has f/u with PCP next month who manages insulin.  > HTN: difficult to manage and on multiple meds.    > CKD: Cr today elevated at 2.14, Cr Cl <30 so unable to resume Xeloda. Will recheck next week. Referred to nephrology, has appointment at [facility] w/ Alexandra Richards on 11/16.   > HFpEF: Attention to fluid status. On lasix 40 mg daily.     Supportive care  Bone pain/Arthalgias: Controlled with APAP and oxycodone 10 mg, did not have enough oxy so pain today 9/10. Refilled oxy 10 mg #120 tablets to take q6h PRN.  Reviewed [place] drug database.  Fatigue: Mild to moderate currently.  Anorexia: Intermittently good - better with control of nausea.  Neuropathy: Mild at baseline.  Has DM.  Constipation: Admitted for constipation > 10 days.  Discharged after having several BMs.  She has been taking MiraLAX twice a day + senna 2 tablets at night, has only had 1 small BM.  Given constipation sent in mag citrate for her to take today.  Encouraged to take MiraLAX 2-3 times a day + senna 2 tablets twice a day.  Nausea: admitted for intractable N/V.  She denies any nausea, aside from when she presented to the ED with constipation.  We will have her use Compazine with Xeloda, only use antiemetics otherwise PRN.    Follow-up  --IV fluids + repeat CMP  --restart Xeloda if CrCl > 30  --Will see nephrology on 11/16  --RTC in 3 weeks prior to next cycle of Xeloda (pending restart today). ADDENDUM: After visit received 1L of IV fluids, rechecked Cr was 1.92 with CrCl now at 32.7 she is ok to restart Xeloda. Called patient to relay this, encouraged to continue pushing fluids. Also informed her of the appointment at [facility] in [place] on Monday 11/16. Will send her the info and address through mychart at her request. She will restart Xeloda tomorrow at 1500 mg BID for 14 on/7 off. Will see her in 3 weeks prior to next cycle. Carlos American, NP  -----------------------------------------------------------------------------------------------------  No chief complaint on file.      History of the Present Illness: Alexandra Richards is a pleasant 58 y.o. female who  has a past medical history of Diabetes mellitus (CMS-HCC), HLD (hyperlipidemia), Hypertension, Non-intractable vomiting with nausea (01/04/2019), and Renal disorder. She is seen in consultation at the request of Dr. Redmond Pulling Richards* for evaluation of breast cancer.    Briefly she has a history of refractory HTN, HLD, HFpEF, CKD, IDDM and had recent ED visits/admissions. She developed right breast soreness and progressive pain pelvic and hip pain. Found to have a left medial breast mass. Biopsy confirmed high-grade carcinoma. ER/PR and HER-2 negative thus essentially triple negative breast cancer. Staging  CT showed diffuse osseous mets with pathological fracture of the sacrum andcentimeters and subcentimeter pulmonary nodules. No other visceral or liver mets.  She was seen by orthopedics who recommended nonoperative intervention.  She received 2 fractions of palliative radiation.No FH of breast or ovarian cancer.    Interval History:  Here today for follow-up on Xeloda, started on 01/12/2019. Here today accompanied by her son.  Last week her Cr was elevated at 2.14, we held Xeloda and she is here today for repeat labs. --Unfortunately her creatinine today remains elevated at 2.11.  We discussed today the importance of lowering her creatinine in regards to chemotherapy.  We will plan to give IV fluids and recheck creatinine levels after, we also discussed the importance of getting her in with nephrology for management of her CKD given her more recently elevated creatinine levels.  --Constipation remains an issue, she is using MiraLAX twice a day +2 tablets of senna twice a day.  She notes her last bowel movement was 3 days ago, denies feeling constipated.  We discussed the importance of having a bowel movement at least daily or every other day.  Encouraged increase her MiraLAX to 3 times a day.  --She denies any nausea, is no longer taking Zofran  --Continues to have pain in her pelvis area, today she notes 10 out of 10 pain radiating down her legs.  She denies taking any pain medicine this morning, and sitting in the car and then in the wheelchair has made her pain worse.  She notes her pain is okay when she is laying down, increased pain when standing.  Overall this has improved since radiation, previously she was unable to even stand due to pain.  --She is using oxycodone 10 mg at most 3 times a day, also alternating with Tylenol.  Overall she feels her pain is well controlled when she takes her medication.  --We were able to get her a wheelchair, this will be helpful for her in terms of mobility.      Review of Systems: A complete twelve systems review was obtained and is positive per the HPI but otherwise negative in detail. See MIMS #1170 where available.    Functional Status: ECOG PS 1???2.  Independent with some ADLs and all IADLs.  Ambulates with walker due to pain.  Cognitively intact.    Oncology History Overview Note   STRATA  TP53 [id number]  [id number].[id number]>A  Estimated variant allele frequency: 47%  MSS  Microsatellite Stable  TMB - Low  Mutations per MB: 4  Confidence interval: 1 - 12 Malignant neoplasm of breast metastatic to bone (CMS-HCC)   12/16/2018 Initial Diagnosis    Malignant neoplasm of breast metastatic to bone (CMS-HCC)     Malignant neoplasm of lower-inner quadrant of left breast in female, estrogen receptor negative (CMS-HCC)   11/2018 Initial Diagnosis    Malignant neoplasm of lower-inner quadrant of left breast in female, estrogen receptor negative (CMS-HCC)     12/16/2018 Interval Scan(s)    CT Chest:   --3.8 cm left medial breast mass with associated soft tissue stranding, concerning for breast malignancy.  -- Prominent left axillary lymphadenopathy measuring up to 1.6 cm, concerning for nodal metastatic involvement.  -- Numerous bilateral pulmonary nodules throughout all lung lobes, measuring up to 1.0 cm, concerning for metastatic disease.  -- Numerous lytic osseous lesions throughout the axial and appendicular skeleton, consistent with osseous metastatic disease.    CT A/P  --Numerous lytic lesions of the pelvis and proximal  femurs, with pathologic fractures of the bilateral sacrum extending into the sacral foramina.     11/2018 -  Presenting Symptoms    Developed right breast soreness and progressive pain pelvic and hip pain.      12/18/2018 Biopsy    Left breast 3:00 core needle biopsy.  Grade 3 ER negative, PR negative, HER-2 negative by IHC invasive ductal carcinoma.     12/21/2018 - 01/02/2019 Radiation    Palliative radiation to 3000 cGy at 300 cGy/fraction for a total of 10 fractions.      01/10/2019 - 01/10/2019 Chemotherapy    OP BREAST ABRAXANE ATEZOLIZUMAB  atezolizumab 840 mg IV on days 1, 15, nab-PACLitaxel 100 mg/m2 IV on days 1, 8, 15, every 28 days until disease progression or DLT         Patient Active Problem List   Diagnosis   ??? Hyperglycemia   ??? Fatigue   ??? Type 2 diabetes mellitus, with long-term current use of insulin (CMS-HCC)   ??? Anemia   ??? Hyponatremia   ??? Bilateral leg edema   ??? Hypokalemia   ??? Hypomagnesemia   ??? Essential hypertension ??? CKD (chronic kidney disease)   ??? Elevated TSH   ??? Hyperkalemia   ??? (HFpEF) heart failure with preserved ejection fraction (CMS-HCC)   ??? Hypoglycemia   ??? Weakness of both lower extremities   ??? AKI (acute kidney injury) (CMS-HCC)   ??? 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 Malignant neoplasm of lower-inner quadrant of left breast in female, estrogen receptor negative (CMS-HCC)   ??? Constipation   ??? Decreased appetite       Past Medical History:   Diagnosis Date   ??? Diabetes mellitus (CMS-HCC)    ??? HLD (hyperlipidemia)    ??? Hypertension    ??? Non-intractable vomiting with nausea 01/04/2019   ??? Renal disorder        No past surgical history on file.    Gyn History:     Medications:  Current Outpatient Medications   Medication Sig Dispense Refill   ??? acetaminophen (TYLENOL) 325 MG tablet Take 650 mg by mouth every six (6) hours as needed.      ??? amLODIPine (NORVASC) 5 MG tablet Take 2 tablets (10 mg total) by mouth daily.     ??? atorvastatin (LIPITOR) 40 MG tablet Take 40 mg by mouth nightly.      ??? blood sugar diagnostic (ON CALL EXPRESS TEST STRIP) Strp Use to check blood sugar by Other route Four (4) times a day (before meals and nightly). 100 strip 0   ??? capecitabine (XELODA) 500 MG tablet Take 3 tablets (1,500 mg total) by mouth Two (2) times a day . For 14 days on then 7 days off 84 tablet 1   ??? carvediloL (COREG) 25 MG tablet Take 1 tablet (25 mg total) by mouth Two (2) times a day. 60 tablet 0   ??? cephalexin (KEFLEX) 500 MG capsule Take 500 mg by mouth Three (3) times a day.     ??? cholecalciferol, vitamin D3, 1,000 unit (25 mcg) tablet Take 2 tablets (2,000 Units total) by mouth daily. 60 tablet 11   ??? cloNIDine HCL (CATAPRES) 0.3 MG tablet Take 0.3 mg by mouth Two (2) times a day.      ??? furosemide (LASIX) 40 MG tablet Take 40 mg by mouth daily. Take additional 40 mg if weight gain >3 lb  in 1 day or leg swelling ??? hydrALAZINE (APRESOLINE) 100 MG tablet Take 1 tablet (100 mg total) by mouth Three (3) times a day. 90 tablet 0   ??? insulin NPH-insulin regular, 70/30, (HUMULIN/NOVOLIN) 100 unit/mL (70-30) injection Inject 0.15 mL (15 Units total) under the skin Two (2) times a day (30 minutes before a meal). Can increase by 5 units if glucose is greater than 140. 9 mL 0   ??? isosorbide mononitrate (IMDUR) 60 MG 24 hr tablet Take 2 tablets (120 mg total) by mouth daily. 180 tablet 0   ??? lisinopriL (PRINIVIL,ZESTRIL) 20 MG tablet Take 20 mg by mouth daily.     ??? melatonin 5 mg Tab Take 5 mg by mouth nightly.     ??? mirtazapine (REMERON) 15 MG tablet Take 1 tablet (15 mg total) by mouth nightly. For sleep and appetite; 30 tablet 2   ??? oxyCODONE (ROXICODONE) 10 mg immediate release tablet Take 1 tablet (10 mg total) by mouth every six (6) hours as needed for pain. 90 tablet 0   ??? polyethylene glycol (MIRALAX) 17 gram/dose powder Take 17 g by mouth Two (2) times a day. 3060 g 0   ??? promethazine (PHENERGAN) 25 MG suppository Insert 1 suppository (25 mg total) into the rectum every eight (8) hours as needed for nausea (vomiting) for up to 30 doses. 30 suppository 0   ??? senna (SENOKOT) 8.6 mg tablet Take 2 tablets by mouth Two (2) times a day. 120 tablet 2     No current facility-administered medications for this visit.        Allergies:  No Known Allergies    Family History: Cancer-related family history is not on file.    Social History:   Social History     Social History Narrative    Lives alone in [place].  Never married.  3 children (1 son and 2 daughters).  Son live in [place] and she plans to move to an apartment near him. 23 grandchildren.   Former smoker.  Half [employer] x5-10 years.  Quit in 7s.  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  Associates degree in computer programming and bachelors in hospitality/tourism.       Physical Examination:   Vital Signs: There were no vitals taken for this visit. General: Healthy-appearing patient, mild distress 2/2 left hip pain  HEENT: PERRTLA, EOMI, OP clear  Cardiovascular: Normal S1 and S2. RRR. No audible murmurs.  Respiratory: Chest clear to percussion and auscultation.   Gastrointestinal: Abdomen soft without masses and tenderness, no hepatosplenomegaly or other masses.  Normal bowel sounds.  Musculoskeletal: No bony pain or tenderness. ++ pain overlying left hip/pelvis  Skin: No rash, ecchymoses or purpuric lesions noted. ++ Right third toe nail removed with granulation tissue present.  No concern for infection.  Breasts: Exam deferred today.  Neurologic:  Alert and oriented. Grossly non-focal  Lymphatic: No cervical, axillary or supraclavicular adenopathy.   Extremity: No lower extremity edema or upper extremity lymphedema    DATA REVIEW:    Laboratory: I personally reviewed the pertinent laboratory data.    Radiology: I personally reviewed the pertinent imaging.    Pathology: I personally reviewed the pathology report.

## 2019-02-07 NOTE — Unmapped (Signed)
Received patient for infusion, added on for IVF with elevated creatinine. Per Shirlean Schlein, patient to receive fluids with a recheck of CMP.     Escorted patient to restroom twice, assisted to wheelchair and with toileting due to hip pain.     Administered IVF, repeat creatinine 1.92. Per Corinne, ok to discharge.    Patient tolerated treatment well with no adverse reaction noted. PIV flushed with 20ml NS with + blood return and catheter intact. Gauze and coban applied to site. AVS given. Left clinic via wheelchair escorted by RN.

## 2019-02-07 NOTE — Unmapped (Signed)
10mg  oxycodone given for pt's 10/10 bilateral leg pain. Brought pt a bottle of water and some crackers.

## 2019-02-08 ENCOUNTER — Encounter: Admit: 2019-02-08 | Discharge: 2019-02-08 | Payer: MEDICAID

## 2019-02-08 ENCOUNTER — Ambulatory Visit: Admit: 2019-02-08 | Discharge: 2019-02-08 | Payer: MEDICAID

## 2019-02-08 MED FILL — CAPECITABINE 500 MG TABLET: ORAL | 21 days supply | Qty: 84 | Fill #1

## 2019-02-08 NOTE — Unmapped (Signed)
[facility] Clinical Assessment & Refill Coordination Note    Alexandra Richards, DOB: 1960/05/18  Phone: [phone] (home)     All above HIPAA information was verified with patient.     Specialty Medication(s):   Hematology/Oncology: Capecitabine 500mg , directions: 3 tablets twice daily for 14 days on and 7 days off     Current Outpatient Medications   Medication Sig Dispense Refill   ??? acetaminophen (TYLENOL) 325 MG tablet Take 650 mg by mouth every six (6) hours as needed.      ??? amLODIPine (NORVASC) 5 MG tablet Take 2 tablets (10 mg total) by mouth daily.     ??? atorvastatin (LIPITOR) 40 MG tablet Take 40 mg by mouth nightly.      ??? blood sugar diagnostic (ON CALL EXPRESS TEST STRIP) Strp Use to check blood sugar by Other route Four (4) times a day (before meals and nightly). 100 strip 0   ??? capecitabine (XELODA) 500 MG tablet Take 3 tablets (1,500 mg total) by mouth Two (2) times a day . For 14 days on then 7 days off 84 tablet 1   ??? carvediloL (COREG) 25 MG tablet Take 1 tablet (25 mg total) by mouth Two (2) times a day. 60 tablet 0   ??? cephalexin (KEFLEX) 500 MG capsule Take 500 mg by mouth Three (3) times a day.     ??? cholecalciferol, vitamin D3, 1,000 unit (25 mcg) tablet Take 2 tablets (2,000 Units total) by mouth daily. 60 tablet 11   ??? cloNIDine HCL (CATAPRES) 0.3 MG tablet Take 0.3 mg by mouth Two (2) times a day.      ??? furosemide (LASIX) 40 MG tablet Take 40 mg by mouth daily. Take additional 40 mg if weight gain >3 lb in 1 day or leg swelling     ??? hydrALAZINE (APRESOLINE) 100 MG tablet Take 1 tablet (100 mg total) by mouth Three (3) times a day. 90 tablet 0   ??? insulin NPH-insulin regular, 70/30, (HUMULIN/NOVOLIN) 100 unit/mL (70-30) injection Inject 0.15 mL (15 Units total) under the skin Two (2) times a day (30 minutes before a meal). Can increase by 5 units if glucose is greater than 140. 9 mL 0 ??? isosorbide mononitrate (IMDUR) 60 MG 24 hr tablet Take 2 tablets (120 mg total) by mouth daily. 180 tablet 0   ??? lisinopriL (PRINIVIL,ZESTRIL) 20 MG tablet Take 20 mg by mouth daily.     ??? melatonin 5 mg Tab Take 5 mg by mouth nightly.     ??? mirtazapine (REMERON) 15 MG tablet Take 1 tablet (15 mg total) by mouth nightly. For sleep and appetite; 30 tablet 2   ??? oxyCODONE (ROXICODONE) 10 mg immediate release tablet Take 1 tablet (10 mg total) by mouth every six (6) hours as needed for pain. 90 tablet 0   ??? polyethylene glycol (MIRALAX) 17 gram/dose powder Take 17 g by mouth Two (2) times a day. 3060 g 0   ??? promethazine (PHENERGAN) 25 MG suppository Insert 1 suppository (25 mg total) into the rectum every eight (8) hours as needed for nausea (vomiting) for up to 30 doses. (Patient not taking: Reported on 02/07/2019) 30 suppository 0   ??? senna (SENOKOT) 8.6 mg tablet Take 2 tablets by mouth Two (2) times a day. 120 tablet 2     No current facility-administered medications for this visit.         Changes to medications: Rhylei reports no changes at  this time.    No Known Allergies    Changes to allergies: No    SPECIALTY MEDICATION ADHERENCE     Capecitabine 500 mg: 3 days of medicine on hand            Specialty medication(s) dose(s) confirmed: Regimen is correct and unchanged.  It was on hold for 4 days and she will restart cycle on 02/08/19     Are there any concerns with adherence? No    Adherence counseling provided? Not needed    CLINICAL MANAGEMENT AND INTERVENTION      Clinical Benefit Assessment:    Do you feel the medicine is effective or helping your condition? Yes    Clinical Benefit counseling provided? Progress note from 02/07/19 shows evidence of clinical benefit    Adverse Effects Assessment:    Are you experiencing any side effects? No    Are you experiencing difficulty administering your medicine? No    Quality of Life Assessment: How many days over the past month did your breast cancer  keep you from your normal activities? For example, brushing your teeth or getting up in the morning. 0    Have you discussed this with your provider? Not needed    Therapy Appropriateness:    Is therapy appropriate? Yes, therapy is appropriate and should be continued    DISEASE/MEDICATION-SPECIFIC INFORMATION      N/A    PATIENT SPECIFIC NEEDS     ? Does the patient have any physical, cognitive, or cultural barriers? No    ? Is the patient high risk? No     ? Does the patient require a Care Management Plan? No     ? Does the patient require physician intervention or other additional services (i.e. nutrition, smoking cessation, social work)? No      SHIPPING     Specialty Medication(s) to be Shipped:   Hematology/Oncology: Capecitabine 500mg , directions: 3 tabs twice daily for 14 days on and 7 off    Other medication(s) to be shipped: n/a     Changes to insurance: No    Delivery Scheduled: Yes, Expected medication delivery date: 02/08/19.     Medication will be delivered via Same Day Courier to the confirmed prescription address in [facility].    The patient will receive a drug information handout for each medication shipped and additional FDA Medication Guides as required.  Verified that patient has previously received a Conservation officer, historic buildings.    All of the patient's questions and concerns have been addressed.    Jasmin Winberry Vangie Bicker   [facility] Specialty Pharmacist

## 2019-02-08 NOTE — Unmapped (Signed)
Infusion on 02/07/2019   Component Date Value Ref Range Status   ??? Sodium 02/07/2019 138  135 - 145 mmol/L Final   ??? Potassium 02/07/2019 4.6  3.5 - 5.0 mmol/L Final   ??? Chloride 02/07/2019 105  98 - 107 mmol/L Final   ??? Anion Gap 02/07/2019 8  7 - 15 mmol/L Final   ??? CO2 02/07/2019 25.0  22.0 - 30.0 mmol/L Final   ??? BUN 02/07/2019 23* 7 - 21 mg/dL Final   ??? Creatinine 02/07/2019 1.92* 0.60 - 1.00 mg/dL Final   ??? BUN/Creatinine Ratio 02/07/2019 12   Final   ??? EGFR CKD-EPI Non-African American,* 02/07/2019 28* >=60 mL/min/1.63m2 Final   ??? EGFR CKD-EPI African American, Fem* 02/07/2019 32* >=60 mL/min/1.46m2 Final   ??? Glucose 02/07/2019 69* 70 - 179 mg/dL Final   ??? Calcium 16/12/9602 9.0  8.5 - 10.2 mg/dL Final   ??? Albumin 54/11/8117 3.3* 3.5 - 5.0 g/dL Final   ??? Total Protein 02/07/2019 6.2* 6.5 - 8.3 g/dL Final   ??? Total Bilirubin 02/07/2019 0.4  0.0 - 1.2 mg/dL Final   ??? AST 14/78/2956 22  14 - 38 U/L Final   ??? ALT 02/07/2019 11  <35 U/L Final   ??? Alkaline Phosphatase 02/07/2019 66  38 - 126 U/L Final   Office Visit on 02/07/2019   Component Date Value Ref Range Status   ??? Calcium 02/07/2019 9.2  8.5 - 10.2 mg/dL Final   ??? Phosphorus 02/07/2019 4.9* 2.9 - 4.7 mg/dL Final   ??? Sodium 21/30/8657 138  135 - 145 mmol/L Final   ??? Potassium 02/07/2019 4.8  3.5 - 5.0 mmol/L Final   ??? Chloride 02/07/2019 104  98 - 107 mmol/L Final   ??? Anion Gap 02/07/2019 9  7 - 15 mmol/L Final   ??? CO2 02/07/2019 25.0  22.0 - 30.0 mmol/L Final   ??? BUN 02/07/2019 25* 7 - 21 mg/dL Final   ??? Creatinine 02/07/2019 2.11* 0.60 - 1.00 mg/dL Final   ??? BUN/Creatinine Ratio 02/07/2019 12   Final   ??? EGFR CKD-EPI Non-African American,* 02/07/2019 25* >=60 mL/min/1.77m2 Final   ??? EGFR CKD-EPI African American, Fem* 02/07/2019 29* >=60 mL/min/1.5m2 Final   ??? Glucose 02/07/2019 145  70 - 179 mg/dL Final   ??? Calcium 84/69/6295 9.4  8.5 - 10.2 mg/dL Final   ??? Albumin 28/41/3244 3.3* 3.5 - 5.0 g/dL Final ??? Total Protein 02/07/2019 6.3* 6.5 - 8.3 g/dL Final   ??? Total Bilirubin 02/07/2019 0.4  0.0 - 1.2 mg/dL Final   ??? AST 03/30/7251 22  14 - 38 U/L Final   ??? ALT 02/07/2019 12  <35 U/L Final   ??? Alkaline Phosphatase 02/07/2019 68  38 - 126 U/L Final

## 2019-02-11 ENCOUNTER — Encounter: Admit: 2019-02-11 | Discharge: 2019-02-12 | Payer: MEDICAID | Attending: Nephrology | Primary: Nephrology

## 2019-02-11 DIAGNOSIS — C7951 Secondary malignant neoplasm of bone: Principal | ICD-10-CM

## 2019-02-11 DIAGNOSIS — N189 Chronic kidney disease, unspecified: Principal | ICD-10-CM

## 2019-02-11 DIAGNOSIS — C50919 Malignant neoplasm of unspecified site of unspecified female breast: Principal | ICD-10-CM

## 2019-02-11 DIAGNOSIS — R809 Proteinuria, unspecified: Principal | ICD-10-CM

## 2019-02-11 LAB — CREATININE, URINE: Lab: 70.5

## 2019-02-11 LAB — PROTEIN / CREATININE RATIO, URINE: PROTEIN/CREAT RATIO, URINE: 1.858

## 2019-02-11 NOTE — Unmapped (Signed)
Referring Provider: Alveda Reasons, *     PCP:  Tyson Babinski [facility]    02/11/2019    Chief Complaint: elevated Cr    HPI:  Alexandra Richards is a 58 y.o. female with a h/o metastatic breast cancer who presents for evaluation of elevated Cr. She is accompanied in clinic by her son.     Her PCP is at [facility]); she is referred by Carlos American (NP in oncology) for worsened renal function. Notes are sent over in advance of the visit and reviewed.      She was recently diagnosed with metastatic breast cancer in 11/2018, with mets to bone and lung (pathologic fractures of sacrum and lytic lesions in bilateral femoral heads). She was started on palliative radiation therapy in late September (9/25-10/7) and has also been started on chemotherapy (in early October).     She has a h/o HTN and DM. She reports having had these for a long time, at least 10 years. Baseline Cr previously appeared to be around mid-1s. She has had previous episodes of AKI - Cr peaked around 2.5 in mid-May, was 2.7 on admission to the hospital in mid-September when she was diagnosed with cancer. More recent baseline appears to be upper 1s (1.6-1.9). She was referred to [place] as her Cr was up to 2.14 on 11/5.     She was admitted to [facility] from 10/8-10/10 with nausea/vomiting which was ultimately controlled with frequent antiemetics. She reports she hasn't continued to have significant nausea/vomiting and only requires occasional antiemetics, not daily. No diarrhea. Feels her appetite is good and that she is eating well. Diet review: yesterday had cereal for breakfast, soup for lunch, and hamburger for dinner, as well as an orange as a snack. Also drinks about 4 bottles of water a day and feels that she is staying well-hydrated.     No visible hematuria. No foamy or bubbly urine. Feels that her urine output is good. Reports no LE edema currently, since started Lasix, but prior to that had some swelling/fluid (started a couple months ago by PCP). No dysuria.     Checks her BP at home, states it runs around 121/36, 114 systolic. States she didn't take her lisinopril this morning as yesterday she had run out it. States that when she takes all her medications she feels so sleepy. Denies dizziness/lightheadedness.     Denies any NSAID use, states she only uses tylenol and oxycodone for her pain.         ROS:  11 systems reviewed and negative except those noted in the history of present illness      PAST MEDICAL HISTORY:  Past Medical History:   Diagnosis Date   ??? Diabetes mellitus (CMS-HCC)    ??? HLD (hyperlipidemia)    ??? Hypertension    ??? Non-intractable vomiting with nausea 01/04/2019   ??? Renal disorder        ALLERGIES  Patient has no known allergies.    SOCIAL HISTORY  Social History     Socioeconomic History   ??? Marital status: Single     Spouse name: Not on file   ??? Number of children: 3   ??? Years of education: Not on file   ??? Highest education level: Not on file   Occupational History   ??? Not on file   Social Needs   ??? Financial resource strain: Not on file   ??? Food insecurity  Worry: Often true     Inability: Often true   ??? Transportation needs     Medical: No     Non-medical: No   Tobacco Use   ??? Smoking status: Former Smoker     Types: Cigarettes   ??? Smokeless tobacco: Never Used   Substance and Sexual Activity   ??? Alcohol use: No     Alcohol/week: 0.0 standard drinks   ??? Drug use: Yes     Types: Marijuana     Comment: Last use in April 2020   ??? Sexual activity: Not Currently   Lifestyle   ??? Physical activity     Days per week: Not on file     Minutes per session: Not on file   ??? Stress: Not on file   Relationships   ??? Social Wellsite geologist on phone: Not on file     Gets together: Not on file     Attends religious service: Not on file     Active member of club or organization: Not on file     Attends meetings of clubs or organizations: Not on file     Relationship status: Not on file Other Topics Concern   ??? Not on file   Social History Narrative    Lives alone in [place].  Never married.  3 children (1 son and 2 daughters).  Son live in [place] and she plans to move to an apartment near him. 23 grandchildren.   Former smoker.  Half PPD x5-10 years.  Quit in 26s.  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  Associates degree in computer programming and bachelors in hospitality/tourism.         FAMILY HISTORY  No family history on file.     MEDICATIONS:  Current Outpatient Medications   Medication Sig Dispense Refill   ??? acetaminophen (TYLENOL) 325 MG tablet Take 650 mg by mouth every six (6) hours as needed.      ??? amLODIPine (NORVASC) 5 MG tablet Take 2 tablets (10 mg total) by mouth daily.     ??? atorvastatin (LIPITOR) 40 MG tablet Take 40 mg by mouth nightly.      ??? blood sugar diagnostic (ON CALL EXPRESS TEST STRIP) Strp Use to check blood sugar by Other route Four (4) times a day (before meals and nightly). 100 strip 0   ??? capecitabine (XELODA) 500 MG tablet Take 3 tablets (1,500 mg total) by mouth Two (2) times a day . For 14 days on then 7 days off 84 tablet 1   ??? carvediloL (COREG) 25 MG tablet Take 1 tablet (25 mg total) by mouth Two (2) times a day. 60 tablet 0   ??? cephalexin (KEFLEX) 500 MG capsule Take 500 mg by mouth Three (3) times a day.     ??? cholecalciferol, vitamin D3, 1,000 unit (25 mcg) tablet Take 2 tablets (2,000 Units total) by mouth daily. 60 tablet 11   ??? cloNIDine HCL (CATAPRES) 0.3 MG tablet Take 0.3 mg by mouth Two (2) times a day.      ??? furosemide (LASIX) 40 MG tablet Take 40 mg by mouth daily. Take additional 40 mg if weight gain >3 lb in 1 day or leg swelling     ??? insulin NPH-insulin regular, 70/30, (HUMULIN/NOVOLIN) 100 unit/mL (70-30) injection Inject 0.15 mL (15 Units total) under the skin Two (2) times a day (30 minutes before a meal). Can increase by 5  units if glucose is greater than 140. 9 mL 0   ??? isosorbide mononitrate (IMDUR) 60 MG 24 hr tablet Take 2 tablets (120 mg total) by mouth daily. 180 tablet 0   ??? lisinopriL (PRINIVIL,ZESTRIL) 20 MG tablet Take 20 mg by mouth daily.     ??? melatonin 5 mg Tab Take 5 mg by mouth nightly.     ??? mirtazapine (REMERON) 15 MG tablet Take 1 tablet (15 mg total) by mouth nightly. For sleep and appetite; 30 tablet 2   ??? oxyCODONE (ROXICODONE) 10 mg immediate release tablet Take 1 tablet (10 mg total) by mouth every six (6) hours as needed for pain. 90 tablet 0   ??? polyethylene glycol (MIRALAX) 17 gram/dose powder Take 17 g by mouth Two (2) times a day. 3060 g 0   ??? promethazine (PHENERGAN) 25 MG suppository Insert 1 suppository (25 mg total) into the rectum every eight (8) hours as needed for nausea (vomiting) for up to 30 doses. 30 suppository 0   ??? senna (SENOKOT) 8.6 mg tablet Take 2 tablets by mouth Two (2) times a day. 120 tablet 2   ??? hydrALAZINE (APRESOLINE) 100 MG tablet Take 1 tablet (100 mg total) by mouth Three (3) times a day. 90 tablet 0     No current facility-administered medications for this visit.        PHYSICAL EXAM:     Vitals:    02/11/19 1201   BP: 112/65   Pulse: 79   Temp: 36.8 ??C (98.3 ??F)     CONSTITUTIONAL: Alert, well appearing, no distress  HEENT: Moist mucous membranes, oropharynx clear without erythema or exudate  EYES: Extra ocular movements intact. Sclerae anicteric.  NECK: Supple   CARDIOVASCULAR: RRR  PULM: Clear to auscultation bilaterally  GASTROINTESTINAL: Soft, active bowel sounds, nontender  EXTREMITIES: No lower extremity edema bilaterally.   SKIN: No rashes or lesions  NEUROLOGIC: No focal motor or sensory deficits  PSYCH: alert and oriented x 3    MEDICAL DECISION MAKING    Results for orders placed or performed in visit on 02/07/19   Comprehensive Metabolic Panel   Result Value Ref Range    Sodium 138 135 - 145 mmol/L    Potassium 4.6 3.5 - 5.0 mmol/L    Chloride 105 98 - 107 mmol/L    Anion Gap 8 7 - 15 mmol/L    CO2 25.0 22.0 - 30.0 mmol/L    BUN 23 (H) 7 - 21 mg/dL Creatinine 1.61 (H) 0.96 - 1.00 mg/dL    BUN/Creatinine Ratio 12     EGFR CKD-EPI Non-African American, Female 28 (L) >=60 mL/min/1.58m2    EGFR CKD-EPI African American, Female 32 (L) >=60 mL/min/1.70m2    Glucose 69 (L) 70 - 179 mg/dL    Calcium 9.0 8.5 - 04.5 mg/dL    Albumin 3.3 (L) 3.5 - 5.0 g/dL    Total Protein 6.2 (L) 6.5 - 8.3 g/dL    Total Bilirubin 0.4 0.0 - 1.2 mg/dL    AST 22 14 - 38 U/L    ALT 11 <35 U/L    Alkaline Phosphatase 66 38 - 126 U/L        Creatinine   Date Value Ref Range Status   02/07/2019 1.92 (H) 0.60 - 1.00 mg/dL Final   40/98/1191 4.78 (H) 0.60 - 1.00 mg/dL Final   29/56/2130 8.65 (H) 0.60 - 1.00 mg/dL Final   78/46/9629 5.28 (H) 0.60 - 1.00 mg/dL Final   41/32/4401 0.27 (  H) 0.60 - 1.00 mg/dL Final   16/12/9602 5.40 (H) 0.60 - 1.00 mg/dL Final   98/01/9146 8.29 0.60 - 1.00 MG/DL Final        No results in the last day    Invalid input(s): C02,  GLU      Urine Sediment: frequent squams, up to 5 WBCs/hpf, otherwise unremarkable.   UA: neg LE, neg nitr, normal urobili, 2000++++ protein, pH 6.5, neg blood, SG 1.005, neg ketones, neg bili, neg glc    IMAGING STUDIES:   Renal US ordered today    ASSESSMENT/PLAN:    Alexandra Richards is a 58 y.o. patient with a past medical history significant for DM, HTN, and recently diagnosed metastatic breast cancer who is being seen in clinic.     CKD: she appears to have underlying CKD on review of labs with prior baseline Cr in mid-1s going back to 2019, and Cr 1.2 in 2015. Suspect her underlying CKD is related to HTN and DM. She has had episodes of AKI which have likely contributed to her worsening renal function (in May and September), may have had other episodes that weren't captured. Most recently Cr up to 2.2 in early November, improved from 2.1 --> 1.9 with outpatient IVF. I wonder whether some of this worsening renal function could be due to hypotension/underperfusion as she reports low BPs on home readings and is low in clinic today. She reports eating and drinking well, her n/v is improved, she is not using NSAIDs. Sediment unrevealing. Renal insufficiency is described as a potential adverse effect of capecitabine (<5%) and acute renal failure reported in <1% - I think this is less likely. In the setting of existing CKD presumably 2/2 DM and HTN, she is at higher risk for AKIs related to changes in volume status and perfusion (BP).   - Renal US to eval for obstruction  - Would back off her antihypertensives as below - goal BP generally <130/80 but especially in her case would be more cautious. Life expectancy limited by metastatic cancer; I think the benefits of strict control (in terms of decreasing long-term risk for CV events) are outweighed by risks/harms of hypotension. I think being more permissive and allowing BP 130-140/~80 is very reasonable in her case.   - The patient was counseled on avoiding nephrotoxic agents including but not limited to NSAIDs and contrasted studies.      HTN: She reports BP had been significantly elevated in the past and she has since been started on a regimen including the following: amlodipine 5mg  every day, Coreg 25mg  BID, Imdur 60mg  BID, clonidine 0.3mg  BID, lisinopril 20mg  every day, hydralazine 100mg  TID, and Lasix 40mg  QD. She is monitoring BP at home and recently it has been running low, confirmed in clinic today.   - Decrease hydralazine from 100mg  to 50mg  TID, ideally would stop completely if able.   - Would next titrate off clonidine given that she is reporting significant fatigue and this is not a first or second line agent - in addition to fatigue as a side effect, can have rebound HTN and more frequent dosing can be burdensome  - She has room to increase amlodipine from 5 to 10mg  if needed or to increase lisinopril from 20mg  to 40mg  if needed (as above, would titrate off hydralazine and clonidine - both lisinopril and amlodipine preferred to hydralazine and clonidine).   - Hold Lasix if she is having n/v or decreased intake    Alexandra Richards will  follow up in 3 months

## 2019-02-11 NOTE — Unmapped (Signed)
Decrease hydralazine from 100mg  to 50mg  three times a day. Continue to monitor your blood pressure.

## 2019-02-14 MED ORDER — OXYCODONE 10 MG TABLET
ORAL_TABLET | Freq: Four times a day (QID) | ORAL | 0 refills | 23 days | Status: CP | PRN
Start: 2019-02-14 — End: ?

## 2019-02-14 NOTE — Unmapped (Signed)
Hi,    Patient Alexandra Richards called requesting a medication refill for the following:    ??? Medication: oxycodone  ??? Dosage: 10mg   ??? Days left of medication: 0  ??? [facility]    The expected turnaround time is 3-4 business days       Check Indicates criteria has been reviewed and confirmed with the patient:    [x]  Preferred Name   [x]  DOB and/or MR#  [x]  Preferred Contact Method  [x]  Phone Number(s)   [x]  Preferred Pharmacy   []  MyChart     Thank you,  Jannette Spanner  [facility]  [phone]

## 2019-02-14 NOTE — Unmapped (Signed)
Patient called the Hospital Discharge line to request refill of oxycodone. Transferred patient to the Cancer Center call center for further assistance.    In-basket message also sent to Dr. Archie Balboa and Corrine Tasia Catchings, AGNP.

## 2019-02-17 NOTE — Unmapped (Signed)
Recent:   What is the date of your last related visit?  N/A  Related acute medications Rx'd:  N/A  Home treatment tried:  N/A    Relevant:   Allergies: N/A  Medications: N/A  Health History: Breast CA- is on Chemo   Weight: N/A     Also seen by Kerrin Champagne- Hematology Oncology, message of call sent to him as well      Reason for Disposition  ??? [1] High-risk adult (e.g., age > 89, osteoporosis, chronic steroid use) AND [2] limping     Limp has not really changed since before fall but area sore- same him that was already Dx with Fx    Answer Assessment - Initial Assessment Questions  1. MECHANISM: How did the injury happen? (e.g., twisting injury, direct blow)       Was walking and tripped over walker- she got up on her. But was also Dx with Fracture to right hip at end of sept and end of October- was seen last week and still have fracture but was healing  2. ONSET: When did the injury happen? (Minutes or hours ago)       Larey Seat at 1:30-2 am  3. LOCATION: Where is the injury located?       Left knee  4. APPEARANCE of INJURY: What does the injury look like?  (e.g., deformity of leg)      Slight swelling to left leg- knee to foot. No internal or external rotation. No diffence in length between both legs- no change in color or temp. Able to MAEW. No numbness  5. SEVERITY: Can you put weight on that leg? Can you walk?      Yes can put wt on both legs.   6. SIZE: For cuts, bruises, or swelling, ask: How large is it? (e.g., inches or centimeters)       Slight swelling to left lower leg  7. PAIN: Is there pain? If so, ask: How bad is the pain?  (Scale 1-10; or mild, moderate, severe)      3-4/10- right side, no pain to left leg  8. TETANUS: For any breaks in the skin, ask: When was the last tetanus booster?     n/a  9. OTHER SYMPTOMS: Do you have any other symptoms?       no  10. PREGNANCY: Is there any chance you are pregnant? When was your last menstrual period?        n/a Answer Assessment - Initial Assessment Questions  1. MECHANISM: How did the injury happen? (e.g., twisting injury, direct blow)       See other protocol  2. ONSET: When did the injury happen? (Minutes or hours ago)       See other protocol  3. LOCATION: Where is the injury located?       See other protocol  4. APPEARANCE of INJURY: What does the injury look like?  (e.g., deformity of leg)      See other protocol  5. SEVERITY: Can you put weight on that leg? Can you walk?       Yes and yes- uses walker  6. SIZE: For cuts, bruises, or swelling, ask: How large is it? (e.g., inches or centimeters;  entire joint)       See other protocol  7. PAIN: Is there pain? If so, ask: How bad is the pain?  (e.g., Scale 1-10; or mild, moderate, severe)      See other protocol  8. TETANUS:  For any breaks in the skin, ask: When was the last tetanus booster?      N/a- no breaks in skin  9. OTHER SYMPTOMS: Do you have any other symptoms?       No  10. PREGNANCY: Is there any chance you are pregnant? When was your last menstrual period?        n/a    Protocols used: HIP INJURY-A-AH, LEG INJURY-A-AH

## 2019-02-17 NOTE — Unmapped (Signed)
Reason for visit  Follow up metastatic disease and pathologic sacral fracture    History of present illness  Alexandra Richards is here for ongoing care.  There have been no interval events or new symptoms.  She notes improving overall pain around her hips and pelvis.  She is here for routine follow up and repeat xrays    Exam  Awake, alert, no distress  Moderate tenderness over the lumbar spine and sacrum  No significant irritability with hip range of motion  Intact motor/sensation in lumbar and sciatic distributions    Imaging  No significant interval change/displacement    Impression  Pathologic sacral fracture    Plan  Continue nonsurgical management  Continue current activity level  Follow up in 2 months with repeat xrays

## 2019-02-20 DIAGNOSIS — C7951 Secondary malignant neoplasm of bone: Principal | ICD-10-CM

## 2019-02-20 DIAGNOSIS — C50919 Malignant neoplasm of unspecified site of unspecified female breast: Principal | ICD-10-CM

## 2019-02-20 NOTE — Unmapped (Signed)
[facility] Refill Coordination Note    Specialty Medication(s) to be Shipped:   Hematology/Oncology: Capecitabine 500mg , directions: Take 3 tablets (1,500 mg total) by mouth Two (2) times a day . For 14 days on then 7 days off    Other medication(s) to be shipped: n/a     Alexandra Richards, DOB: 1960/09/18  Phone: [phone] (home)       All above HIPAA information was verified with patient.     Completed refill call assessment today to schedule patient's medication shipment from the [facility] [phone]).       Specialty medication(s) and dose(s) confirmed: Regimen is correct and unchanged.   Changes to medications: Alexandra Richards reports no changes at this time.  Changes to insurance: No  Questions for the pharmacist: No    Confirmed patient received Welcome Packet with first shipment. The patient will receive a drug information handout for each medication shipped and additional FDA Medication Guides as required.       DISEASE/MEDICATION-SPECIFIC INFORMATION        N/A    SPECIALTY MEDICATION ADHERENCE     Medication Adherence    Patient reported X missed doses in the last month: 0  Specialty Medication: Capecitabine 500mg   Patient is on additional specialty medications: No  Informant: patient                Capecitabine 500 mg: 5 days of medicine on hand.  Next cycle starts 03/04/19      SHIPPING     Shipping address confirmed in Epic.     Delivery Scheduled: Yes, Expected medication delivery date: 02/28/19.  However, Rx request for refills was sent to the provider as there are none remaining.     Medication will be delivered via Next Day Courier to the prescription address in Epic [place].    Wyatt Mage M Elisabeth Cara   [facility] Specialty Technician

## 2019-02-22 MED ORDER — CAPECITABINE 500 MG TABLET
ORAL_TABLET | Freq: Two times a day (BID) | ORAL | 1 refills | 14 days | Status: CP
Start: 2019-02-22 — End: ?
  Filled 2019-02-28: qty 84, 21d supply, fill #0

## 2019-02-25 NOTE — Unmapped (Signed)
Spoke with patient after Dr. Archie Balboa received message about pt falling. She is doing well. She tripped over cord in her room last week, and fell on her bed. She hit her arm and leg slightly but just to catch fall. She did not go to the ground or  Hit her head. No injuries from fall. She is on week off of cancer medication and tolerating well overall. She has no other questions/concerns. Appreciates call. EMR

## 2019-02-26 ENCOUNTER — Ambulatory Visit: Admit: 2019-02-26 | Discharge: 2019-02-27 | Payer: MEDICAID | Attending: Family | Primary: Family

## 2019-02-26 NOTE — Unmapped (Signed)
Recent:   What is the date of your last related visit?  na  Related acute medications Rx'd:  na  Home treatment tried:  na    Relevant:   Allergies: Patient has no known allergies.  Medications: na  Health History: na  Weight: na      Reason for Disposition  ??? Ear congestion    Answer Assessment - Initial Assessment Questions  1. LOCATION: Which ear is involved?        Right ear    2. SENSATION: Describe how the ear feels.        feels like it is stopped up.     3. ONSET:  When did the ear symptoms start?        Today at 1700 ( 11/30)    4. PAIN: Do you also have an earache? If so, ask: How bad is it? (Scale 1-10; or mild, moderate, severe)        no  5. CAUSE: What do you think is causing the ear congestion?        Unsure  maybe a wax build up.    6. URI: Do you have a runny nose or cough?         no  7. NASAL ALLERGIES: Are there symptoms of hay fever, such as sneezing or a clear nasal discharge?        no  8. PREGNANCY: Is there any chance you are pregnant? When was your last menstrual period?      na    Protocols used: EAR - CONGESTION-A-AH

## 2019-02-26 NOTE — Unmapped (Signed)
COVID Pre-Procedure Intake Form     Assessment     Alexandra Richards is a 58 y.o. female presenting to [facility] for COVID testing.     Plan     If no testing performed, pt counseled on routine care for respiratory illness.  If testing performed, COVID sent.  Patient directed to Home given findings during today's visit.    Subjective     Alexandra Richards is a 58 y.o. female who [facility] with complaints of the following:    Exposure History: In the last 21 days?     Have you traveled outside of [place]? No               Have you been in close contact with someone confirmed by a test to have COVID? (Close contact is within 6 feet for at least 10 minutes) No       Have you worked in a health care facility? No     Lived or worked facility like a nursing home, group home, or assisted living?    No         Are you scheduled to have surgery or a procedure in the next 3 days? Yes               Are you scheduled to receive cancer chemotherapy within the next 7 days?    Yes     Have you ever been tested before for COVID-19 with a swab of your nose? Yes: When: N/A, Where: N/A   Are you a healthcare worker being tested so to return to work No     Right now,  do you have any of the following that developed over the past 7 days (as stated by patient on intake form):    Subjective fever (felt feverish) No   Chills (especially repeated shaking chills) No   Severe fatigue (felt very tired) No   Muscle aches No   Runny nose No   Sore throat No   Loss of taste or smell No   Cough (new onset or worsening of chronic cough) No   Shortness of breath No   Nausea or vomiting No   Headache No   Abdominal Pain No   Diarrhea (3 or more loose stools in last 24 hours) No Scribe's Attestation: Paulita Fujita, FNP, obtained and performed the history, physical exam and medical decision making elements that were entered into the chart.  Signed by Gaspar Skeeters serving as Scribe, on 02/26/2019 10:56 AM      The documentation recorded by the scribe accurately reflects the service I personally performed and the decisions made by me. Aida Puffer, FNP  February 27, 2019 8:29 AM

## 2019-02-28 ENCOUNTER — Encounter: Admit: 2019-02-28 | Discharge: 2019-02-28 | Payer: MEDICAID

## 2019-02-28 DIAGNOSIS — C7951 Secondary malignant neoplasm of bone: Principal | ICD-10-CM

## 2019-02-28 DIAGNOSIS — C50919 Malignant neoplasm of unspecified site of unspecified female breast: Principal | ICD-10-CM

## 2019-02-28 LAB — LYMPHOCYTES ABSOLUTE COUNT: Lymphocytes:NCnc:Pt:Bld:Qn:Automated count: 0.5 — ABNORMAL LOW

## 2019-02-28 LAB — CBC W/ AUTO DIFF
BASOPHILS ABSOLUTE COUNT: 0.1 10*9/L (ref 0.0–0.1)
BASOPHILS RELATIVE PERCENT: 0.7 %
EOSINOPHILS ABSOLUTE COUNT: 0.2 10*9/L (ref 0.0–0.7)
EOSINOPHILS RELATIVE PERCENT: 2.2 %
LYMPHOCYTES ABSOLUTE COUNT: 0.5 10*9/L — ABNORMAL LOW (ref 0.7–4.0)
LYMPHOCYTES RELATIVE PERCENT: 7.1 %
MEAN CORPUSCULAR HEMOGLOBIN CONC: 35.4 g/dL (ref 30.0–36.0)
MEAN CORPUSCULAR HEMOGLOBIN: 33.1 pg (ref 26.0–34.0)
MEAN CORPUSCULAR VOLUME: 93.6 fL (ref 82.0–98.0)
MEAN PLATELET VOLUME: 6.6 fL — ABNORMAL LOW (ref 7.0–10.0)
MONOCYTES ABSOLUTE COUNT: 1.1 10*9/L — ABNORMAL HIGH (ref 0.1–1.0)
MONOCYTES RELATIVE PERCENT: 14.7 %
NEUTROPHILS ABSOLUTE COUNT: 5.6 10*9/L (ref 1.7–7.7)
NEUTROPHILS RELATIVE PERCENT: 75.3 %
PLATELET COUNT: 435 10*9/L (ref 150–450)
RED CELL DISTRIBUTION WIDTH: 18 % — ABNORMAL HIGH (ref 12.0–15.0)
WBC ADJUSTED: 7.4 10*9/L (ref 3.5–10.5)

## 2019-02-28 LAB — COMPREHENSIVE METABOLIC PANEL
ALBUMIN: 3.6 g/dL (ref 3.5–5.0)
ALKALINE PHOSPHATASE: 65 U/L (ref 38–126)
ALT (SGPT): 10 U/L (ref <35)
ANION GAP: 8 mmol/L (ref 7–15)
AST (SGOT): 24 U/L (ref 14–38)
BILIRUBIN TOTAL: 0.5 mg/dL (ref 0.0–1.2)
BUN / CREAT RATIO: 17
CALCIUM: 9 mg/dL (ref 8.5–10.2)
CHLORIDE: 104 mmol/L (ref 98–107)
CREATININE: 1.88 mg/dL — ABNORMAL HIGH (ref 0.60–1.00)
EGFR CKD-EPI AA FEMALE: 33 mL/min/{1.73_m2} — ABNORMAL LOW (ref >=60)
EGFR CKD-EPI NON-AA FEMALE: 29 mL/min/{1.73_m2} — ABNORMAL LOW (ref >=60)
GLUCOSE RANDOM: 64 mg/dL — ABNORMAL LOW (ref 70–179)
POTASSIUM: 4.3 mmol/L (ref 3.5–5.0)
PROTEIN TOTAL: 6.6 g/dL (ref 6.5–8.3)
SODIUM: 136 mmol/L (ref 135–145)

## 2019-02-28 LAB — BUN / CREAT RATIO: Urea nitrogen/Creatinine:MRto:Pt:Ser/Plas:Qn:: 17

## 2019-02-28 MED ADMIN — denosumab (XGEVA) injection 120 mg: 120 mg | SUBCUTANEOUS | @ 16:00:00 | Stop: 2019-02-28

## 2019-02-28 MED FILL — CAPECITABINE 500 MG TABLET: 21 days supply | Qty: 84 | Fill #0 | Status: AC

## 2019-02-28 NOTE — Unmapped (Signed)
Labs completed via phlebotomy/lab.     Calcium 9.0.     Pt received Xgeva injection in right lower abdomen - pt tolerated well.    AVS received at scheduling.    Pt stable and assisted from clinic via wheelchair by family.

## 2019-03-01 NOTE — Unmapped (Deleted)
Return Patient Evaluation    Referring Physician: [facility], [facility]    PCP: [facility]    Consulting Physicians: Surgical oncology: NA.  Radiation oncology: Eliberto Ivory.    Reason for Visit: The patient is seen in consultation at the request of Dr. Redmond Pulling Healt* for evaluation of breast cancer.    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  First line: Planning Abraxane atezolizumab if PDL 1+.  Capecitabine if not.  Second line: Clinical trial or TBD.    Genomics: Harmony and strata pending.  PDL 1 pending.  Bone directed therapy: Plan Xgeva or Zometa.  Radiation: 12/21/2018??? 01/02/2019: Palliative radiation to pelvis/proximal femur  -----------------------------------------------------------------------------------------------------  Assessment: The following issues were discussed: Alexandra Richards is a 58 y.o. 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 We discussed that MBC is not curable with current technologies but is treatable. Will need distant (likely bone biopsy) at some point. Baseline bone scan with uptake in spine, left hemisacrum + path fracture (received RT) and uptake in left rib, right pubic ramus, and bilateral femora. Plan to treat with SOC Abraxane + atezolizumab however PDL1 testing was negative, therefore will will plan to treat with Capecitabine. Currently no trials and first-line triple negative setting.     She is here today to discuss systemic therapy, unfortunately PDL1 was negative and therefore we would recommend starting with capecitabine. Given her baseline Cr/CKD will plan to start at 1500 mg BID for 14 days on and 7 days off. Of note for calculating CrCl: lean mass should be used rather then total body weight. Some would use Ideal BWT (50 kg for her) or use adjusted BWT (67 kg). Then for CrCl equation would use 140-60 (67)/72*Cr x .85 She was seen last week, unfortunately her creatinine was elevated at 2.14, she appears to have fluctuated between 1.4-2 in the past.  Her creatinine clearance was less than 30, so Xeloda was held. Here today to recheck creatinine levels prior to restarting. Today her Cr level is 2.11. In review it appears she got contrast with CT scan on 10/28, which may have caused contrast nephropathy, though did not have hydronephrosis on recent imaging. Will plan to give IV fluids today, and recheck CMP. If Cr improved then can consider re-starting Xeloda. We also have her scheduled with [facility] on 11/16 for w/u and management of her CKD.     She has also continued to struggle with constipation, she was hospitalized recently for constipation + colitis. Last visit we discussed using Mirilax + senna 2 tablets BID, especially in the setting of taking narcotics daily for pain management. At last visit she had not had a BM in 4 days so she was provided with magnesium citrate to take as that was most effective when she was in the hospital in producing a BM. She is still not having regular BM's, we discussed increasing Mirilax to TID. No longer with nausea so not requiring Zofran. Still with pain in her pelvis radiation to both legs. She is using oxycodone 10 mg TID + alternating with tylenol. She tells me today she did not take any pain medication, gave a dose of 10 mg in clinic     She will get IV fluids in infusion today, after which will repeat CMP. If Cr not improved then will wait until she see's nephrology for further work-up. Will restart Xeloda once CrCl > 30.     Plan  1.  Metastatic left breast cancer  >Systemic therapy: Plan for capecitabine since PDL1 negative, DR d/t CKD - 1500 mg BID for 14 days on and 7 days off  >Palliative Radiation: 12/21/2018??? 01/02/2019: Palliative radiation to pelvis/proximal femur  >Genetic/Genomic: HARMONY and STRATA - no actionable mutations. >Imaging: 12/16/2018: CT CAP bone and centimeters/subcentimeter pulmonary mets.  Bone scan with several areas of bone lesions.   >Bone Directed therapy: Dental clearance inpatient. [name] on 10/15, continue q6w (to time with 3 week visits).  >Vasc Access: Will need port at some point.  >Tumor Markers: none elevated     Comorbidities  > DM: On insulin, hyperglycemia on recent admission. Increased NPH. BS have been 200 or less, no hypoglycemia, has f/u with PCP next month who manages insulin.  > HTN: difficult to manage and on multiple meds.    > CKD: Cr today elevated at 2.14, Cr Cl <30 so unable to resume Xeloda. Will recheck next week. Referred to nephrology, has appointment at [facility] w/ Dr. Eulah Pont on 11/16.   > HFpEF: Attention to fluid status. On lasix 40 mg daily.     Supportive care  Bone pain/Arthalgias: Controlled with APAP and oxycodone 10 mg, did not have enough oxy so pain today 9/10. Refilled oxy 10 mg #120 tablets to take q6h PRN.  Reviewed [place] drug database.  Fatigue: Mild to moderate currently.  Anorexia: Intermittently good - better with control of nausea.  Neuropathy: Mild at baseline.  Has DM.  Constipation: Admitted for constipation > 10 days.  Discharged after having several BMs.  She has been taking MiraLAX twice a day + senna 2 tablets at night, has only had 1 small BM.  Given constipation sent in mag citrate for her to take today.  Encouraged to take MiraLAX 2-3 times a day + senna 2 tablets twice a day.  Nausea: admitted for intractable N/V.  She denies any nausea, aside from when she presented to the ED with constipation.  We will have her use Compazine with Xeloda, only use antiemetics otherwise PRN.    Follow-up  --IV fluids + repeat CMP  --restart Xeloda if CrCl > 30  --Will see nephrology on 11/16  --RTC in 3 weeks prior to next cycle of Xeloda (pending restart today). ADDENDUM: After visit received 1L of IV fluids, rechecked Cr was 1.92 with CrCl now at 32.7 she is ok to restart Xeloda. Called patient to relay this, encouraged to continue pushing fluids. Also informed her of the appointment at [facility] in [place] on Monday 11/16. Will send her the info and address through mychart at her request. She will restart Xeloda tomorrow at 1500 mg BID for 14 on/7 off. Will see her in 3 weeks prior to next cycle. Carlos American, NP  -----------------------------------------------------------------------------------------------------  No chief complaint on file.      History of the Present Illness: Alexandra Richards is a pleasant 58 y.o. female who  has a past medical history of Diabetes mellitus (CMS-HCC), HLD (hyperlipidemia), Hypertension, Non-intractable vomiting with nausea (01/04/2019), and Renal disorder. She is seen in consultation at the request of Dr. Redmond Pulling Healt* for evaluation of breast cancer.    Briefly she has a history of refractory HTN, HLD, HFpEF, CKD, IDDM and had recent ED visits/admissions. She developed right breast soreness and progressive pain pelvic and hip pain. Found to have a left medial breast mass. Biopsy confirmed high-grade carcinoma. ER/PR and HER-2 negative thus essentially triple negative breast cancer. Staging  CT showed diffuse osseous mets with pathological fracture of the sacrum andcentimeters and subcentimeter pulmonary nodules. No other visceral or liver mets.  She was seen by orthopedics who recommended nonoperative intervention.  She received 2 fractions of palliative radiation.No FH of breast or ovarian cancer.    Interval History:  Here today for follow-up on Xeloda, started on 01/12/2019. Here today accompanied by her son.  Last week her Cr was elevated at 2.14, we held Xeloda and she is here today for repeat labs. --Unfortunately her creatinine today remains elevated at 2.11.  We discussed today the importance of lowering her creatinine in regards to chemotherapy.  We will plan to give IV fluids and recheck creatinine levels after, we also discussed the importance of getting her in with nephrology for management of her CKD given her more recently elevated creatinine levels.  --Constipation remains an issue, she is using MiraLAX twice a day +2 tablets of senna twice a day.  She notes her last bowel movement was 3 days ago, denies feeling constipated.  We discussed the importance of having a bowel movement at least daily or every other day.  Encouraged increase her MiraLAX to 3 times a day.  --She denies any nausea, is no longer taking Zofran  --Continues to have pain in her pelvis area, today she notes 10 out of 10 pain radiating down her legs.  She denies taking any pain medicine this morning, and sitting in the car and then in the wheelchair has made her pain worse.  She notes her pain is okay when she is laying down, increased pain when standing.  Overall this has improved since radiation, previously she was unable to even stand due to pain.  --She is using oxycodone 10 mg at most 3 times a day, also alternating with Tylenol.  Overall she feels her pain is well controlled when she takes her medication.  --We were able to get her a wheelchair, this will be helpful for her in terms of mobility.      Review of Systems: A complete twelve systems review was obtained and is positive per the HPI but otherwise negative in detail. See MIMS #1170 where available.    Functional Status: ECOG PS 1???2.  Independent with some ADLs and all IADLs.  Ambulates with walker due to pain.  Cognitively intact.    Oncology History Overview Note   STRATA  TP53 [id number]  [id number]:c.[id number]>A  Estimated variant allele frequency: 47%  MSS  Microsatellite Stable  TMB - Low  Mutations per MB: 4  Confidence interval: 1 - 12 Malignant neoplasm of breast metastatic to bone (CMS-HCC)   12/16/2018 Initial Diagnosis    Malignant neoplasm of breast metastatic to bone (CMS-HCC)     Malignant neoplasm of lower-inner quadrant of left breast in female, estrogen receptor negative (CMS-HCC)   11/2018 Initial Diagnosis    Malignant neoplasm of lower-inner quadrant of left breast in female, estrogen receptor negative (CMS-HCC)     12/16/2018 Interval Scan(s)    CT Chest:   --3.8 cm left medial breast mass with associated soft tissue stranding, concerning for breast malignancy.  -- Prominent left axillary lymphadenopathy measuring up to 1.6 cm, concerning for nodal metastatic involvement.  -- Numerous bilateral pulmonary nodules throughout all lung lobes, measuring up to 1.0 cm, concerning for metastatic disease.  -- Numerous lytic osseous lesions throughout the axial and appendicular skeleton, consistent with osseous metastatic disease.    CT A/P  --Numerous lytic lesions of the pelvis and proximal  femurs, with pathologic fractures of the bilateral sacrum extending into the sacral foramina.     11/2018 -  Presenting Symptoms    Developed right breast soreness and progressive pain pelvic and hip pain.      12/18/2018 Biopsy    Left breast 3:00 core needle biopsy.  Grade 3 ER negative, PR negative, HER-2 negative by IHC invasive ductal carcinoma.     12/21/2018 - 01/02/2019 Radiation    Palliative radiation to 3000 cGy at 300 cGy/fraction for a total of 10 fractions.      01/10/2019 - 01/10/2019 Chemotherapy    OP BREAST ABRAXANE ATEZOLIZUMAB  atezolizumab 840 mg IV on days 1, 15, nab-PACLitaxel 100 mg/m2 IV on days 1, 8, 15, every 28 days until disease progression or DLT         Patient Active Problem List   Diagnosis   ??? Hyperglycemia   ??? Fatigue   ??? Type 2 diabetes mellitus, with long-term current use of insulin (CMS-HCC)   ??? Anemia   ??? Hyponatremia   ??? Bilateral leg edema   ??? Hypokalemia   ??? Hypomagnesemia   ??? Essential hypertension ??? CKD (chronic kidney disease)   ??? Elevated TSH   ??? Hyperkalemia   ??? (HFpEF) heart failure with preserved ejection fraction (CMS-HCC)   ??? Hypoglycemia   ??? Weakness of both lower extremities   ??? AKI (acute kidney injury) (CMS-HCC)   ??? 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 Malignant neoplasm of lower-inner quadrant of left breast in female, estrogen receptor negative (CMS-HCC)   ??? Constipation   ??? Decreased appetite       Past Medical History:   Diagnosis Date   ??? Diabetes mellitus (CMS-HCC)    ??? HLD (hyperlipidemia)    ??? Hypertension    ??? Non-intractable vomiting with nausea 01/04/2019   ??? Renal disorder        No past surgical history on file.    Gyn History:     Medications:  Current Outpatient Medications   Medication Sig Dispense Refill   ??? acetaminophen (TYLENOL) 325 MG tablet Take 650 mg by mouth every six (6) hours as needed.      ??? amLODIPine (NORVASC) 5 MG tablet Take 2 tablets (10 mg total) by mouth daily.     ??? atorvastatin (LIPITOR) 40 MG tablet Take 40 mg by mouth nightly.      ??? blood sugar diagnostic (ON CALL EXPRESS TEST STRIP) Strp Use to check blood sugar by Other route Four (4) times a day (before meals and nightly). 100 strip 0   ??? capecitabine (XELODA) 500 MG tablet Take 3 tablets (1,500 mg total) by mouth Two (2) times a day . For 14 days on then 7 days off 84 tablet 1   ??? carvediloL (COREG) 25 MG tablet Take 1 tablet (25 mg total) by mouth Two (2) times a day. 60 tablet 0   ??? cephalexin (KEFLEX) 500 MG capsule Take 500 mg by mouth Three (3) times a day.     ??? cholecalciferol, vitamin D3, 1,000 unit (25 mcg) tablet Take 2 tablets (2,000 Units total) by mouth daily. 60 tablet 11   ??? cloNIDine HCL (CATAPRES) 0.3 MG tablet Take 0.3 mg by mouth Two (2) times a day.      ??? furosemide (LASIX) 40 MG tablet Take 40 mg by mouth daily. Take additional 40 mg if weight gain >3 lb  in 1 day or leg swelling ??? hydrALAZINE (APRESOLINE) 100 MG tablet Take 1 tablet (100 mg total) by mouth Three (3) times a day. 90 tablet 0   ??? insulin NPH-insulin regular, 70/30, (HUMULIN/NOVOLIN) 100 unit/mL (70-30) injection Inject 0.15 mL (15 Units total) under the skin Two (2) times a day (30 minutes before a meal). Can increase by 5 units if glucose is greater than 140. 9 mL 0   ??? isosorbide mononitrate (IMDUR) 60 MG 24 hr tablet Take 2 tablets (120 mg total) by mouth daily. 180 tablet 0   ??? lisinopriL (PRINIVIL,ZESTRIL) 20 MG tablet Take 20 mg by mouth daily.     ??? melatonin 5 mg Tab Take 5 mg by mouth nightly.     ??? mirtazapine (REMERON) 15 MG tablet Take 1 tablet (15 mg total) by mouth nightly. For sleep and appetite; 30 tablet 2   ??? oxyCODONE (ROXICODONE) 10 mg immediate release tablet Take 1 tablet (10 mg total) by mouth every six (6) hours as needed for pain. 90 tablet 0   ??? polyethylene glycol (MIRALAX) 17 gram/dose powder Take 17 g by mouth Two (2) times a day. 3060 g 0   ??? promethazine (PHENERGAN) 25 MG suppository Insert 1 suppository (25 mg total) into the rectum every eight (8) hours as needed for nausea (vomiting) for up to 30 doses. 30 suppository 0   ??? senna (SENOKOT) 8.6 mg tablet Take 2 tablets by mouth Two (2) times a day. 120 tablet 2     No current facility-administered medications for this visit.        Allergies:  No Known Allergies    Family History: Cancer-related family history is not on file.    Social History:   Social History     Social History Narrative    Lives alone in [place].  Never married.  3 children (1 son and 2 daughters).  Son live in [place] and she plans to move to an apartment near him. 23 grandchildren.   Former smoker.  Half [employer] x5-10 years.  Quit in 7s.  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  Associates degree in computer programming and bachelors in hospitality/tourism.       Physical Examination:   Vital Signs: There were no vitals taken for this visit. General: Healthy-appearing patient, mild distress 2/2 left hip pain  HEENT: PERRTLA, EOMI, OP clear  Cardiovascular: Normal S1 and S2. RRR. No audible murmurs.  Respiratory: Chest clear to percussion and auscultation.   Gastrointestinal: Abdomen soft without masses and tenderness, no hepatosplenomegaly or other masses.  Normal bowel sounds.  Musculoskeletal: No bony pain or tenderness. ++ pain overlying left hip/pelvis  Skin: No rash, ecchymoses or purpuric lesions noted. ++ Right third toe nail removed with granulation tissue present.  No concern for infection.  Breasts: Exam deferred today.  Neurologic:  Alert and oriented. Grossly non-focal  Lymphatic: No cervical, axillary or supraclavicular adenopathy.   Extremity: No lower extremity edema or upper extremity lymphedema    DATA REVIEW:    Laboratory: I personally reviewed the pertinent laboratory data.    Radiology: I personally reviewed the pertinent imaging.    Pathology: I personally reviewed the pathology report.

## 2019-03-05 ENCOUNTER — Ambulatory Visit
Admit: 2019-03-05 | Discharge: 2019-03-11 | Disposition: A | Discharge status: Home / Self Care | Payer: MEDICAID | Admitting: Hematology & Oncology

## 2019-03-05 LAB — COMPREHENSIVE METABOLIC PANEL
ALBUMIN: 4.4 g/dL (ref 3.5–5.0)
ALBUMIN: 3.9 g/dL (ref 3.5–5.0)
ALKALINE PHOSPHATASE: 91 U/L (ref 38–126)
ALKALINE PHOSPHATASE: 92 U/L (ref 38–126)
ALT (SGPT): 12 U/L (ref <35)
ALT (SGPT): 10 U/L (ref <35)
ANION GAP: 14 mmol/L (ref 7–15)
ANION GAP: 16 mmol/L — ABNORMAL HIGH (ref 7–15)
AST (SGOT): 29 U/L (ref 14–38)
AST (SGOT): 25 U/L (ref 14–38)
BILIRUBIN TOTAL: 0.7 mg/dL (ref 0.0–1.2)
BILIRUBIN TOTAL: 0.7 mg/dL (ref 0.0–1.2)
BLOOD UREA NITROGEN: 20 mg/dL (ref 7–21)
BLOOD UREA NITROGEN: 18 mg/dL (ref 7–21)
BUN / CREAT RATIO: 14
BUN / CREAT RATIO: 13
CALCIUM: 9.3 mg/dL (ref 8.5–10.2)
CALCIUM: 8.5 mg/dL (ref 8.5–10.2)
CHLORIDE: 105 mmol/L (ref 98–107)
CHLORIDE: 104 mmol/L (ref 98–107)
CO2: 19 mmol/L — ABNORMAL LOW (ref 22.0–30.0)
CO2: 15 mmol/L — ABNORMAL LOW (ref 22.0–30.0)
CREATININE: 1.46 mg/dL — ABNORMAL HIGH (ref 0.60–1.00)
CREATININE: 1.43 mg/dL — ABNORMAL HIGH (ref 0.60–1.00)
EGFR CKD-EPI AA FEMALE: 46 mL/min/{1.73_m2} — ABNORMAL LOW (ref >=60)
EGFR CKD-EPI NON-AA FEMALE: 40 mL/min/{1.73_m2} — ABNORMAL LOW (ref >=60)
GLUCOSE RANDOM: 309 mg/dL — ABNORMAL HIGH (ref 70–179)
POTASSIUM: 4.4 mmol/L (ref 3.5–5.0)
POTASSIUM: 4.2 mmol/L (ref 3.5–5.0)
PROTEIN TOTAL: 7.8 g/dL (ref 6.5–8.3)
PROTEIN TOTAL: 7.6 g/dL (ref 6.5–8.3)
SODIUM: 138 mmol/L (ref 135–145)
SODIUM: 135 mmol/L (ref 135–145)

## 2019-03-05 LAB — HEMATOCRIT: Hematocrit:VFr:Pt:Bld:Qn:: 32.3 — ABNORMAL LOW

## 2019-03-05 LAB — CBC W/ AUTO DIFF
BASOPHILS ABSOLUTE COUNT: 0 10*9/L (ref 0.0–0.1)
BASOPHILS RELATIVE PERCENT: 0.3 %
BASOPHILS RELATIVE PERCENT: 0.2 %
EOSINOPHILS ABSOLUTE COUNT: 0 10*9/L (ref 0.0–0.7)
EOSINOPHILS ABSOLUTE COUNT: 0.1 10*9/L (ref 0.0–0.4)
EOSINOPHILS RELATIVE PERCENT: 0 %
EOSINOPHILS RELATIVE PERCENT: 1 %
HEMOGLOBIN: 11.1 g/dL — ABNORMAL LOW (ref 12.0–15.5)
HEMOGLOBIN: 10.9 g/dL — ABNORMAL LOW (ref 12.0–16.0)
LARGE UNSTAINED CELLS: 1 % (ref 0–4)
LYMPHOCYTES ABSOLUTE COUNT: 0.6 10*9/L — ABNORMAL LOW (ref 0.7–4.0)
LYMPHOCYTES ABSOLUTE COUNT: 0.5 10*9/L — ABNORMAL LOW (ref 1.5–5.0)
LYMPHOCYTES RELATIVE PERCENT: 5.2 %
MEAN CORPUSCULAR HEMOGLOBIN CONC: 34.3 g/dL (ref 30.0–36.0)
MEAN CORPUSCULAR HEMOGLOBIN CONC: 32.2 g/dL (ref 31.0–37.0)
MEAN CORPUSCULAR HEMOGLOBIN: 31.3 pg (ref 26.0–34.0)
MEAN CORPUSCULAR VOLUME: 94.3 fL (ref 82.0–98.0)
MEAN CORPUSCULAR VOLUME: 97 fL (ref 80.0–100.0)
MEAN PLATELET VOLUME: 7.1 fL (ref 7.0–10.0)
MONOCYTES ABSOLUTE COUNT: 0.4 10*9/L (ref 0.1–1.0)
MONOCYTES ABSOLUTE COUNT: 0.6 10*9/L (ref 0.2–0.8)
MONOCYTES RELATIVE PERCENT: 3.7 %
MONOCYTES RELATIVE PERCENT: 5.7 %
NEUTROPHILS ABSOLUTE COUNT: 9.8 10*9/L — ABNORMAL HIGH (ref 1.7–7.7)
NEUTROPHILS ABSOLUTE COUNT: 8.9 10*9/L — ABNORMAL HIGH (ref 2.0–7.5)
NEUTROPHILS RELATIVE PERCENT: 90.8 %
NEUTROPHILS RELATIVE PERCENT: 87.1 %
PLATELET COUNT: 485 10*9/L — ABNORMAL HIGH (ref 150–450)
PLATELET COUNT: 507 10*9/L — ABNORMAL HIGH (ref 150–440)
RED BLOOD CELL COUNT: 3.42 10*12/L — ABNORMAL LOW (ref 3.90–5.03)
RED BLOOD CELL COUNT: 3.49 10*12/L — ABNORMAL LOW (ref 4.00–5.20)
RED CELL DISTRIBUTION WIDTH: 18.7 % — ABNORMAL HIGH (ref 12.0–15.0)
RED CELL DISTRIBUTION WIDTH: 18.4 % — ABNORMAL HIGH (ref 12.0–15.0)
WBC ADJUSTED: 10.8 10*9/L — ABNORMAL HIGH (ref 3.5–10.5)
WBC ADJUSTED: 10.2 10*9/L (ref 4.5–11.0)

## 2019-03-05 LAB — BLOOD GAS CRITICAL CARE PANEL, VENOUS
BASE EXCESS VENOUS: -5.2 — ABNORMAL LOW (ref -2.0–2.0)
CALCIUM IONIZED VENOUS (MG/DL): 4.32 mg/dL — ABNORMAL LOW (ref 4.40–5.40)
GLUCOSE WHOLE BLOOD: 320 mg/dL — ABNORMAL HIGH (ref 70–179)
HCO3 VENOUS: 18 mmol/L — ABNORMAL LOW (ref 22–27)
HEMOGLOBIN BLOOD GAS: 10.8 g/dL — ABNORMAL LOW (ref 12.00–16.00)
LACTATE BLOOD VENOUS: 1.6 mmol/L (ref 0.5–1.8)
O2 SATURATION VENOUS: 96.7 % — ABNORMAL HIGH (ref 40.0–85.0)
PO2 VENOUS: 80 mmHg — ABNORMAL HIGH (ref 30–55)
POTASSIUM WHOLE BLOOD: 3.5 mmol/L (ref 3.4–4.6)
SODIUM WHOLE BLOOD: 135 mmol/L (ref 135–145)

## 2019-03-05 LAB — EGFR CKD-EPI NON-AA FEMALE: Lab: 40 — ABNORMAL LOW

## 2019-03-05 LAB — MAGNESIUM: Magnesium:MCnc:Pt:Ser/Plas:Qn:: 1.6

## 2019-03-05 LAB — BUN / CREAT RATIO: Urea nitrogen/Creatinine:MRto:Pt:Ser/Plas:Qn:: 14

## 2019-03-05 LAB — PO2 VENOUS: Oxygen:PPres:Pt:BldV:Qn:: 80 — ABNORMAL HIGH

## 2019-03-05 LAB — LIPASE: Triacylglycerol lipase:CCnc:Pt:Ser/Plas:Qn:: 143

## 2019-03-05 LAB — MONOCYTES ABSOLUTE COUNT: Monocytes:NCnc:Pt:Bld:Qn:Automated count: 0.6

## 2019-03-05 MED ADMIN — diphenhydrAMINE (BENADRYL) injection 25 mg: 25 mg | INTRAVENOUS | Stop: 2019-03-05

## 2019-03-05 MED ADMIN — hydrALAZINE (APRESOLINE) injection 10 mg: 10 mg | INTRAVENOUS | Stop: 2019-03-05

## 2019-03-05 MED ADMIN — promethazine (PHENERGAN) 12.5 mg in sodium chloride (NS) 0.9 % 25 mL infusion: 12.5 mg | INTRAVENOUS | @ 19:00:00 | Stop: 2019-03-05

## 2019-03-05 MED ADMIN — metoclopramide (REGLAN) injection 10 mg: 10 mg | INTRAVENOUS | Stop: 2019-03-05

## 2019-03-05 MED ADMIN — promethazine (PHENERGAN) 12.5 mg in sodium chloride (NS) 0.9 % 25 mL infusion: 12.5 mg | INTRAVENOUS | @ 22:00:00 | Stop: 2019-03-05

## 2019-03-05 NOTE — Unmapped (Signed)
Pt is on chemo for breast cancer. Pt has not been able to tolerate PO for the last 4 days. Pt stating some diarrhea on Saturday. Pt has not been able to tolerate her BP meds or Lasix.

## 2019-03-05 NOTE — Unmapped (Signed)
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 Unable to tolerate BP medications.     On arrival, vitals indicate pt hypertensive, otherwise pt afebrile. Pt chronically ill and very uncomfortable appearing on exam, in NAD. Lungs clear bilaterally, tachycardic rate, rhythm regular, abdomen soft, nontender. Mucous membranes dry, peripheral extremities warm, well perfused.     Suspect intractable nausea/vomiting/diarrhea secondary to chemotherapy versus gastritis versus less likely concern for other intra-abdominal process including abscess versus partial/complete obstruction given no abd tenderness versus other infectious etiology. Plan for CBC, CMP, UA, lipase. IV fluids started by EMS. Will dose with phenergan and reassess. Will also obtain EKG given pt hypertensive and tachycardic.    3:29 PM Pt reassessed, states does not feel much improved, continues to feel extremely nauseas, ill appearing.  WBC only slightly elevated at 10.8, up from prior study.  Hemoglobin and hematocrit are mildly decreased at 11.1 and 32.3, respectively.  Patient with normal absolute neutrophil count.  CMP with elevated creatinine at 1.46, from prior studies.  Glucose elevated at 309.  Urinalysis is still pending. Given pt's persistent symptoms despite multiple anti-emetics, will page [facility] for admission. Pt remains stable.     4:18 PM  Pt accepted for admission to [facility].       History     Chief Complaint  Emesis      HPI   Patient was seen by me at 12:59 PM.    Patient is a 58 y.o. 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 The patient reports three days of persistent loose, watery diarrheal stools and generalized abdominal discomfort. She additionally has developed persistent nausea and vomiting along with inability to keep down food or fluids due to these symptoms that began yesterday. Last po intake was 3 days ago. She denies any blood, coffee ground appearance, or black/tarry color in vomit or stools. Notes vomit is clear. When asked to quantify number of episodes, she states too many to count of both vomiting and diarrhea. No relief with IV zofran from EMS en route. She has also had subjective fevers, chills, and night sweats, rhinorrhea, and increased urination that began last night. She denies chest pain, shortness of breath, no recent sick or COVID contacts. Pt recently started new chemotherapy infusion (denosumab) and was recently tested and was negative for COVID-19 infection. Previous chart, nursing notes, and vital signs reviewed.      Pertinent labs & imaging results that were available  during my care of the patient were reviewed by me and considered in my medical decision making (see chart for details).    Past Medical History:   Diagnosis Date   ??? Cancer (CMS-HCC)    ??? Diabetes mellitus (CMS-HCC)    ??? HLD (hyperlipidemia)    ??? Hypertension    ??? Non-intractable vomiting with nausea 01/04/2019   ??? Renal disorder        No past surgical history on file.      Current Facility-Administered Medications:   ???  acetaminophen (TYLENOL) tablet 650 mg, 650 mg, Oral, Q4H PRN, Ahad Abid, MD  ???  aluminum-magnesium hydroxide-simethicone (MAALOX MAX) 80-80-8 mg/mL oral suspension, 30 mL, Oral, Q4H PRN, Alanson Puls, MD  ???  amLODIPine (NORVASC) tablet 10 mg, 10 mg, Oral, Daily, Ahad Abid, MD, 10 mg at 03/07/19 0902  ???  atorvastatin (LIPITOR) tablet 40 mg, 40 mg, Oral, Nightly, Ahad Abid, MD, 40 mg at 03/06/19 2124  ???  carvediloL (COREG) tablet 25 mg, 25 mg, Oral, BID, Kelvin Cellar, MD  ???  cholecalciferol (vitamin D3) tablet 2,000 Units, 2,000 Units, Oral, Daily, Alanson Puls, MD, 2,000 Units at 03/07/19 0902  ???  cloNIDine HCL (CATAPRES) tablet 0.3 mg, 0.3 mg, Oral, BID, Ahad Abid, MD, 0.3 mg at 03/07/19 0902  ???  dextrose 50 % in water (D50W) 50 % solution 12.5 g, 12.5 g, Intravenous, Q10 Min PRN, Alanson Puls, MD  ???  enoxaparin (LOVENOX) syringe 40 mg, 40 mg, Subcutaneous, Q24H, Ahad Abid, MD, 40 mg at 03/06/19 2125  ???  hydrALAZINE (APRESOLINE) injection 10 mg, 10 mg, Intravenous, Q4H PRN, Alanson Puls, MD  ???  insulin lispro (HumaLOG) injection 1-20 Units, 1-20 Units, Subcutaneous, ACHS, Alanson Puls, MD, 2 Units at 03/07/19 1752  ???  insulin NPH (HumuLIN,NovoLIN) injection 12 Units, 12 Units, Subcutaneous, Q12H SCH, Kelvin Cellar, MD  ???  isosorbide mononitrate (IMDUR) 24 hr tablet 120 mg, 120 mg, Oral, Daily, Ahad Abid, MD, 120 mg at 03/07/19 0902 ???  lisinopriL (PRINIVIL,ZESTRIL) tablet 20 mg, 20 mg, Oral, Daily, Ahad Abid, MD, 20 mg at 03/07/19 0902  ???  melatonin tablet 3 mg, 3 mg, Oral, QPM, Kelvin Cellar, MD, 3 mg at 03/07/19 1753  ???  ondansetron (ZOFRAN) injection 4 mg, 4 mg, Intravenous, Q8H PRN, Alanson Puls, MD, 4 mg at 03/06/19 1856  ???  polyethylene glycol (MIRALAX) packet 17 g, 17 g, Oral, BID, Kelvin Cellar, MD, 17 g at 03/07/19 0902  ???  promethazine (PHENERGAN) 12.5 mg in sodium chloride (NS) 0.9 % 25 mL infusion, 12.5 mg, Intravenous, Q6H PRN, Alanson Puls, MD, 12.5 mg at 03/05/19 2302  ???  senna (SENOKOT) tablet 2 tablet, 2 tablet, Oral, BID, Kelvin Cellar, MD, 2 tablet at 03/07/19 0902    Allergies  Patient has no known allergies.    History reviewed. No pertinent family history.    Social History  Social History     Tobacco Use   ??? Smoking status: Former Smoker     Types: Cigarettes   ??? Smokeless tobacco: Never Used   Substance Use Topics   ??? Alcohol use: No     Alcohol/week: 0.0 standard drinks   ??? Drug use: Yes     Types: Marijuana     Comment: Last use in April 2020       Review of Systems    Review of Systems   Constitutional: Positive for appetite change. Negative for chills and fever.   Respiratory: Negative for cough and shortness  of breath.    Cardiovascular: Negative for chest pain.   Gastrointestinal: Positive for abdominal pain (generalized discomfort), diarrhea, nausea and vomiting. Negative for blood in stool and constipation.   Genitourinary: Positive for frequency. Negative for decreased urine volume, difficulty urinating, dysuria, flank pain, hematuria and urgency.   All other systems reviewed and are negative.      Labs     Labs Reviewed   COMPREHENSIVE METABOLIC PANEL - Abnormal; Notable for the following components:       Result Value    CO2 19.0 (*)     Creatinine 1.46 (*)     EGFR CKD-EPI Non-African American, Female 20 (*)     EGFR CKD-EPI African American, Female 45 (*)     Glucose 309 (*)     All other components within normal limits COMPREHENSIVE METABOLIC PANEL - Abnormal; Notable for the following components:    Anion Gap 16 (*)     CO2 15.0 (*)     Creatinine 1.43 (*)     EGFR CKD-EPI Non-African American, Female 40 (*)     EGFR CKD-EPI African American, Female 29 (*)     Glucose 316 (*)     All other components within normal limits   BLOOD GAS CRITICAL CARE PANEL, VENOUS - Abnormal; Notable for the following components:    pH, Venous 7.44 (*)     pCO2, Ven 27 (*)     pO2, Ven 80 (*)     HCO3, Ven 18 (*)     Base Excess, Ven -5.2 (*)     O2 Saturation, Venous 96.7 (*)     Calcium, Ionized Venous 4.32 (*)     Glucose Whole Blood 320 (*)     Hgb, blood gas 10.80 (*)     All other components within normal limits   COMPREHENSIVE METABOLIC PANEL - Abnormal; Notable for the following components:    Sodium 134 (*)     Creatinine 1.51 (*)     EGFR CKD-EPI Non-African American, Female 70 (*)     EGFR CKD-EPI African American, Female 50 (*)     Glucose 206 (*)     Calcium 8.2 (*)     All other components within normal limits   URINALYSIS - Abnormal; Notable for the following components:    Protein, UA >/=500 mg/dL (*)     Glucose, UA 161 mg/dL (*)     Blood, UA Small (*)     Bacteria, UA Rare (*)     Hyaline Casts, UA 3 (*)     Mucus, UA Rare (*)     All other components within normal limits   BASIC METABOLIC PANEL - Abnormal; Notable for the following components:    Sodium 133 (*)     CO2 21.0 (*)     Creatinine 1.62 (*)     EGFR CKD-EPI Non-African American, Female 47 (*)     EGFR CKD-EPI African American, Female 78 (*)     Calcium 7.9 (*)     All other components within normal limits   BLOOD GAS, VENOUS - Abnormal; Notable for the following components:    pCO2, Ven 35 (*)     Base Excess, Ven -2.4 (*)     All other components within normal limits   BLOOD GAS, ARTERIAL - Abnormal; Notable for the following components:    pH, Arterial 7.46 (*)     pCO2, Arterial 30.7 (*)     All other components within normal limits  TOXICOLOGY SCREEN, URINE - Abnormal; Notable for the following components:    Cannabinoid Scrn, Ur =/>20 ng/mL (*)     All other components within normal limits    Narrative:     The positive screening test(s)detected drugs belonging to the indicated class or another similar substance. Confirmation of positive screening results is available on request. These results should be used only for medical (i.e., treatment) purposes.   COMPREHENSIVE METABOLIC PANEL - Abnormal; Notable for the following components:    Sodium 134 (*)     Creatinine 1.47 (*)     EGFR CKD-EPI Non-African American, Female 59 (*)     EGFR CKD-EPI African American, Female 57 (*)     Calcium 8.1 (*)     Albumin 3.3 (*)     All other components within normal limits   POCT GLUCOSE, INTERFACED - Abnormal; Notable for the following components:    Glucose, POC 278 (*)     All other components within normal limits   POCT GLUCOSE, INTERFACED - Abnormal; Notable for the following components:    Glucose, POC 194 (*)     All other components within normal limits   POCT GLUCOSE, INTERFACED - Abnormal; Notable for the following components:    Glucose, POC 206 (*)     All other components within normal limits   POCT GLUCOSE, INTERFACED - Abnormal; Notable for the following components:    Glucose, POC 199 (*)     All other components within normal limits   POCT GLUCOSE, INTERFACED - Abnormal; Notable for the following components:    Glucose, POC 372 (*)     All other components within normal limits   POCT GLUCOSE, INTERFACED - Abnormal; Notable for the following components:    Glucose, POC 205 (*)     All other components within normal limits   POCT GLUCOSE, INTERFACED - Abnormal; Notable for the following components:    Glucose, POC 315 (*)     All other components within normal limits   POCT GLUCOSE, INTERFACED - Abnormal; Notable for the following components:    Glucose, POC 303 (*)     All other components within normal limits CBC W/ AUTO DIFF - Abnormal; Notable for the following components:    WBC 10.8 (*)     RBC 3.42 (*)     HGB 11.1 (*)     HCT 32.3 (*)     RDW 18.7 (*)     Platelet 485 (*)     Absolute Neutrophils 9.8 (*)     Absolute Lymphocytes 0.6 (*)     All other components within normal limits   CBC W/ AUTO DIFF - Abnormal; Notable for the following components:    RBC 3.49 (*)     HGB 10.9 (*)     HCT 33.9 (*)     RDW 18.4 (*)     Platelet 507 (*)     Neutrophil Left Shift 1+ (*)     Absolute Neutrophils 8.9 (*)     Absolute Lymphocytes 0.5 (*)     Macrocytosis Moderate (*)     Anisocytosis Moderate (*)     Hypochromasia Slight (*)     All other components within normal limits    Narrative:     Please use the Absolute Differential for reference ranges.    CBC W/ AUTO DIFF - Abnormal; Notable for the following components:    RBC 3.28 (*)     HGB 10.0 (*)  HCT 32.3 (*)     RDW 18.4 (*)     Platelet 454 (*)     Absolute Lymphocytes 0.7 (*)     Absolute Monocytes 1.1 (*)     Macrocytosis Marked (*)     Anisocytosis Moderate (*)     Hypochromasia Moderate (*)     All other components within normal limits    Narrative:     Please use the Absolute Differential for reference ranges.    SLIDE REVIEW - Abnormal; Notable for the following components:    Smear Review Comments See Comment (*)     Burr Cells Present (*)     Hypersegmented Neutrophils Present (*)     All other components within normal limits   CBC W/ AUTO DIFF - Abnormal; Notable for the following components:    RBC 3.02 (*)     HGB 9.4 (*)     HCT 29.0 (*)     RDW 18.1 (*)     Absolute Lymphocytes 0.6 (*)     Macrocytosis Moderate (*)     Anisocytosis Moderate (*)     Hypochromasia Slight (*)     All other components within normal limits    Narrative:     Please use the Absolute Differential for reference ranges.    CBC W/ AUTO DIFF - Abnormal; Notable for the following components:    RBC 3.05 (*)     HGB 9.6 (*)     HCT 29.3 (*)     RDW 17.9 (*) Absolute Lymphocytes 0.6 (*)     Absolute Monocytes 1.0 (*)     Macrocytosis Moderate (*)     Anisocytosis Slight (*)     Hypochromasia Slight (*)     All other components within normal limits    Narrative:     Please use the Absolute Differential for reference ranges.    COVID-19 PCR - Normal    Narrative:     --  This test was performed using the Cepheid Xpert Xpress SARS-CoV-2 assay which has been validated by the CLIA-certified, CAP-inspected [facility] and [facility]. FDA has granted Emergency Use Authorization for this test.   This real-time RT-PCR test detects SARS-CoV-2 by targeting the N2 and E genes. Negative results do not preclude SARS-CoV-2 infection and should not be used as the sole basis for patient management decisions. Negative results must be combined with clinical observations, patient history, and epidemiological information.   Information for providers and patients can be found here: [email or web address]   LIPASE - Normal   MAGNESIUM - Normal   MAGNESIUM - Normal   LACTATE, VENOUS, WHOLE BLOOD - Normal   MAGNESIUM - Normal   POCT GLUCOSE, INTERFACED - Normal   POCT GLUCOSE, INTERFACED - Normal   POCT GLUCOSE, INTERFACED - Normal   POCT GLUCOSE, INTERFACED - Normal   GREEN LITHIUM HEPARIN EXTRA TUBE - Normal   BLOOD CULTURE   BLOOD CULTURE   EXTRA TUBES    Narrative:     The following orders were created for panel order ED Extra Tubes.  Procedure                               Abnormality         Status                     ---------                               -----------         ------  GREEN LITHIUM HEPARIN E.Marland Kitchen.[[id number]]  Normal              Final result               LAVENDER EDTA EXTRA GNFA[[id number]]                        Final result LIGHT BLUE CITRATE EXTR.Marland KitchenMarland Kitchen[[id number]]                      Final result               GOLD SST EXTRA LKGM[[id number]]                             Final result               GOLD SST EXTRA YQIH[[id number]]                             Final result                 Please view results for these tests on the individual orders.   CBC W/ DIFFERENTIAL    Narrative:     The following orders were created for panel order CBC w/ Differential.  Procedure                               Abnormality         Status                     ---------                               -----------         ------                     CBC w/ Differential[[id number]]         Abnormal            Final result                 Please view results for these tests on the individual orders.   CBC W/ DIFFERENTIAL    Narrative:     The following orders were created for panel order CBC w/ Differential.  Procedure                               Abnormality         Status                     ---------                               -----------         ------                     CBC w/ Differential[[id number]]         Abnormal            Final result                 Please  view results for these tests on the individual orders.   CBC W/ DIFFERENTIAL    Narrative:     The following orders were created for panel order CBC w/ Differential.  Procedure                               Abnormality         Status                     ---------                               -----------         ------                     CBC w/ [id number]]         Abnormal            Final result               Morphology [id number]]           Abnormal            Final result                 Please view results for these tests on the individual orders.   CBC W/ DIFFERENTIAL    Narrative:     The following orders were created for panel order CBC w/ Differential.  Procedure                               Abnormality         Status ---------                               -----------         ------                     CBC w/ Differential[[id number]]         Abnormal            Final result                 Please view results for these tests on the individual orders.   CBC W/ DIFFERENTIAL    Narrative:     The following orders were created for panel order CBC w/ Differential.  Procedure                               Abnormality         Status                     ---------                               -----------         ------                     CBC w/ Differential[[id number]]         Abnormal            Final result  Please view results for these tests on the individual orders.   LAVENDER EDTA EXTRA TUBE   LIGHT BLUE CITRATE EXTRA TUBE   GOLD SST EXTRA TUBE   GOLD SST EXTRA TUBE       Radiology     No results found.    Physical Exam     VITAL SIGNS:    Vitals:    03/07/19 0900 03/07/19 1112 03/07/19 1231 03/07/19 1522   BP: 188/96 129/76 143/75 150/83   Pulse: 91 89 81 83   Resp:  24 24 22    Temp:  36.5 ??C (97.7 ??F) 37.3 ??C (99.1 ??F) 37.6 ??C (99.7 ??F)   TempSrc:  Oral Oral Oral   SpO2:  97% 100% 99%   Weight:       Height:           Physical Exam  Vitals signs and nursing note reviewed.   Constitutional:       General: She is not in acute distress.     Appearance: She is well-developed. She is ill-appearing. She is not toxic-appearing or diaphoretic.      Comments:  Pt chronically ill and very uncomfortable appearing on exam, in NAD.    HENT:      Head: Normocephalic and atraumatic.      Mouth/Throat:      Mouth: Mucous membranes are dry.   Neck:      Musculoskeletal: Normal range of motion.   Cardiovascular:      Rate and Rhythm: Regular rhythm. Tachycardia present.      Heart sounds: Normal heart sounds. No murmur. No friction rub. No gallop.    Pulmonary:      Effort: Pulmonary effort is normal. No respiratory distress.      Breath sounds: Normal breath sounds. No stridor. No wheezing, rhonchi or rales.   Abdominal: General: There is no distension.      Palpations: Abdomen is soft. There is no mass.      Tenderness: There is no abdominal tenderness. There is no guarding or rebound.      Hernia: No hernia is present.   Musculoskeletal: Normal range of motion.   Skin:     General: Skin is warm and dry.   Neurological:      Mental Status: She is alert and oriented to person, place, and time.   Psychiatric:         Behavior: Behavior normal.              Documentation assistance was provided by Woody Seller, Scribe, on March 05, 2019 at 12:59 PM for Bluford Kaufmann, [place].        Documentation assistance was provided by the scribe in my presence.  The documentation recorded by the scribe has been reviewed by me and accurately reflects the services I personally performed.         Toni Arthurs [place], [place]  03/07/19 1901

## 2019-03-05 NOTE — Unmapped (Signed)
[facility] Triage Note     Patient: Alexandra Richards     Reason for call:  return call    Time call returned: 1107     Phone Assessment: pt calling reporting extreme diarrhea and vomiting.  She reports atleast 6 loose stools this morning and 7-8 bouts of vomiting.  She also endorses probable fever and chills.  Pt states she cannot keep any medication down.         Triage Recommendations: Pt was advised to go to the ED for workup and possible fluids.  She states she will leave soon.  She plans to come to [facility].

## 2019-03-05 NOTE — Unmapped (Signed)
Hi,     Alexandra Richards has contacted the Communication Center in regards to the following symptom:     Diarrhea: increase of 4-6 stools per day and Nausea and vomiting    Please contact Dalaina Iannone at [phone]    Check Indicates criteria has been reviewed and confirmed with the patient:    [x]  Preferred Name   [x]  DOB and/or MR#  [x]  Preferred Contact Method  [x]  Phone Number(s)   []  MyChart     A page or telephone call has been made to the corresponding clinic.     Thank you,  Jannette Spanner   [facility]   [phone]

## 2019-03-05 NOTE — Unmapped (Signed)
Noted. EMR

## 2019-03-06 LAB — SMEAR REVIEW

## 2019-03-06 LAB — CBC W/ AUTO DIFF
BASOPHILS ABSOLUTE COUNT: 0 10*9/L (ref 0.0–0.1)
BASOPHILS ABSOLUTE COUNT: 0 10*9/L (ref 0.0–0.1)
BASOPHILS RELATIVE PERCENT: 0.1 %
BASOPHILS RELATIVE PERCENT: 0.2 %
EOSINOPHILS ABSOLUTE COUNT: 0 10*9/L (ref 0.0–0.4)
EOSINOPHILS ABSOLUTE COUNT: 0 10*9/L (ref 0.0–0.4)
EOSINOPHILS RELATIVE PERCENT: 0.1 %
EOSINOPHILS RELATIVE PERCENT: 0.3 %
HEMATOCRIT: 32.3 % — ABNORMAL LOW (ref 36.0–46.0)
HEMATOCRIT: 29 % — ABNORMAL LOW (ref 36.0–46.0)
HEMOGLOBIN: 9.4 g/dL — ABNORMAL LOW (ref 12.0–16.0)
LARGE UNSTAINED CELLS: 2 % (ref 0–4)
LARGE UNSTAINED CELLS: 3 % (ref 0–4)
LYMPHOCYTES ABSOLUTE COUNT: 0.7 10*9/L — ABNORMAL LOW (ref 1.5–5.0)
LYMPHOCYTES ABSOLUTE COUNT: 0.6 10*9/L — ABNORMAL LOW (ref 1.5–5.0)
LYMPHOCYTES RELATIVE PERCENT: 7.7 %
LYMPHOCYTES RELATIVE PERCENT: 8.1 %
MEAN CORPUSCULAR HEMOGLOBIN CONC: 31 g/dL (ref 31.0–37.0)
MEAN CORPUSCULAR HEMOGLOBIN CONC: 32.5 g/dL (ref 31.0–37.0)
MEAN CORPUSCULAR HEMOGLOBIN: 30.5 pg (ref 26.0–34.0)
MEAN CORPUSCULAR HEMOGLOBIN: 31.2 pg (ref 26.0–34.0)
MEAN CORPUSCULAR VOLUME: 98.5 fL (ref 80.0–100.0)
MEAN CORPUSCULAR VOLUME: 96.2 fL (ref 80.0–100.0)
MEAN PLATELET VOLUME: 7.8 fL (ref 7.0–10.0)
MEAN PLATELET VOLUME: 9 fL (ref 7.0–10.0)
MONOCYTES RELATIVE PERCENT: 11.6 %
MONOCYTES RELATIVE PERCENT: 10 %
NEUTROPHILS ABSOLUTE COUNT: 7.3 10*9/L (ref 2.0–7.5)
NEUTROPHILS ABSOLUTE COUNT: 6.2 10*9/L (ref 2.0–7.5)
NEUTROPHILS RELATIVE PERCENT: 78.5 %
NEUTROPHILS RELATIVE PERCENT: 78.8 %
PLATELET COUNT: 454 10*9/L — ABNORMAL HIGH (ref 150–440)
PLATELET COUNT: 390 10*9/L (ref 150–440)
RED BLOOD CELL COUNT: 3.28 10*12/L — ABNORMAL LOW (ref 4.00–5.20)
RED CELL DISTRIBUTION WIDTH: 18.4 % — ABNORMAL HIGH (ref 12.0–15.0)
RED CELL DISTRIBUTION WIDTH: 18.1 % — ABNORMAL HIGH (ref 12.0–15.0)
WBC ADJUSTED: 9.3 10*9/L (ref 4.5–11.0)

## 2019-03-06 LAB — BASIC METABOLIC PANEL
BLOOD UREA NITROGEN: 20 mg/dL (ref 7–21)
BUN / CREAT RATIO: 12
CALCIUM: 7.9 mg/dL — ABNORMAL LOW (ref 8.5–10.2)
CHLORIDE: 105 mmol/L (ref 98–107)
CO2: 21 mmol/L — ABNORMAL LOW (ref 22.0–30.0)
CREATININE: 1.62 mg/dL — ABNORMAL HIGH (ref 0.60–1.00)
EGFR CKD-EPI NON-AA FEMALE: 34 mL/min/{1.73_m2} — ABNORMAL LOW (ref >=60)
GLUCOSE RANDOM: 161 mg/dL (ref 70–179)
POTASSIUM: 3.7 mmol/L (ref 3.5–5.0)
SODIUM: 133 mmol/L — ABNORMAL LOW (ref 135–145)

## 2019-03-06 LAB — COMPREHENSIVE METABOLIC PANEL
ALBUMIN: 3.7 g/dL (ref 3.5–5.0)
ALKALINE PHOSPHATASE: 81 U/L (ref 38–126)
ALT (SGPT): 9 U/L (ref <35)
ANION GAP: 9 mmol/L (ref 7–15)
AST (SGOT): 21 U/L (ref 14–38)
BILIRUBIN TOTAL: 0.5 mg/dL (ref 0.0–1.2)
BLOOD UREA NITROGEN: 18 mg/dL (ref 7–21)
BUN / CREAT RATIO: 12
CALCIUM: 8.2 mg/dL — ABNORMAL LOW (ref 8.5–10.2)
CHLORIDE: 103 mmol/L (ref 98–107)
CO2: 22 mmol/L (ref 22.0–30.0)
CREATININE: 1.51 mg/dL — ABNORMAL HIGH (ref 0.60–1.00)
EGFR CKD-EPI AA FEMALE: 43 mL/min/{1.73_m2} — ABNORMAL LOW (ref >=60)
EGFR CKD-EPI NON-AA FEMALE: 37 mL/min/{1.73_m2} — ABNORMAL LOW (ref >=60)
GLUCOSE RANDOM: 206 mg/dL — ABNORMAL HIGH (ref 70–179)
POTASSIUM: 4.1 mmol/L (ref 3.5–5.0)
SODIUM: 134 mmol/L — ABNORMAL LOW (ref 135–145)

## 2019-03-06 LAB — SLIDE REVIEW

## 2019-03-06 LAB — URINALYSIS
BILIRUBIN UA: NEGATIVE
HYALINE CASTS: 3 /LPF — ABNORMAL HIGH (ref 0–1)
KETONES UA: NEGATIVE
LEUKOCYTE ESTERASE UA: NEGATIVE
NITRITE UA: NEGATIVE
PH UA: 6 (ref 5.0–9.0)
RBC UA: 3 /HPF (ref <=4)
SPECIFIC GRAVITY UA: 1.014 (ref 1.003–1.030)
SQUAMOUS EPITHELIAL: 1 /HPF (ref 0–5)
UROBILINOGEN UA: 0.2
WBC UA: 3 /HPF (ref 0–5)

## 2019-03-06 LAB — SPECIMEN SOURCE

## 2019-03-06 LAB — NEUTROPHILS ABSOLUTE COUNT: Neutrophils:NCnc:Pt:Bld:Qn:Automated count: 6.2

## 2019-03-06 LAB — BLOOD GAS, ARTERIAL
HCO3 ARTERIAL: 22 mmol/L (ref 22–27)
PCO2 ARTERIAL: 30.7 mmHg — ABNORMAL LOW (ref 35.0–45.0)
PH ARTERIAL: 7.46 — ABNORMAL HIGH (ref 7.35–7.45)
PO2 ARTERIAL: 105 mmHg (ref 80.0–110.0)

## 2019-03-06 LAB — TOXICOLOGY SCREEN, URINE
AMPHETAMINE SCREEN URINE: <500
BENZODIAZEPINE SCREEN, URINE: <200
COCAINE(METAB.)SCREEN, URINE: <150
OPIATE SCREEN URINE: <300

## 2019-03-06 LAB — PH UA: pH:LsCnc:Pt:Urine:Qn:Test strip: 6

## 2019-03-06 LAB — PO2 ARTERIAL: Oxygen:PPres:Pt:BldA:Qn:: 105

## 2019-03-06 LAB — MAGNESIUM: Magnesium:MCnc:Pt:Ser/Plas:Qn:: 1.7

## 2019-03-06 LAB — PROTEIN TOTAL: Protein:MCnc:Pt:Ser/Plas:Qn:: 6.8

## 2019-03-06 LAB — ANION GAP: Anion gap 3:SCnc:Pt:Ser/Plas:Qn:: 7

## 2019-03-06 LAB — BLOOD GAS, VENOUS
HCO3 VENOUS: 22 mmol/L (ref 22–27)
O2 SATURATION VENOUS: 84.2 % (ref 40.0–85.0)
PCO2 VENOUS: 35 mmHg — ABNORMAL LOW (ref 40–60)
PO2 VENOUS: 50 mmHg (ref 30–55)

## 2019-03-06 LAB — BENZODIAZEPINE SCREEN, URINE: Lab: <200

## 2019-03-06 LAB — LACTATE BLOOD VENOUS: Lactate:SCnc:Pt:BldV:Qn:: 1.3

## 2019-03-06 LAB — ANISOCYTOSIS

## 2019-03-06 MED ADMIN — hydrALAZINE (APRESOLINE) injection 10 mg: 10 mg | INTRAVENOUS | @ 04:00:00 | Stop: 2019-03-06

## 2019-03-06 MED ADMIN — labetaloL (NORMODYNE,TRANDATE) injection 20 mg: 20 mg | INTRAVENOUS | @ 05:00:00 | Stop: 2019-03-06

## 2019-03-06 MED ADMIN — insulin lispro (HumaLOG) injection 1-20 Units: 1-20 [IU] | SUBCUTANEOUS | @ 15:00:00 | Stop: 2019-03-11

## 2019-03-06 MED ADMIN — ondansetron (ZOFRAN) injection 4 mg: 4 mg | INTRAVENOUS | @ 03:00:00 | Stop: 2019-03-11

## 2019-03-06 MED ADMIN — lactated ringers bolus 500 mL: 500 mL | INTRAVENOUS | @ 03:00:00 | Stop: 2019-03-06

## 2019-03-06 MED ADMIN — cloNIDine (CATAPRES-TTS) 0.3 mg/24 hr 1 patch: 1 | TRANSDERMAL | @ 04:00:00 | Stop: 2019-03-06

## 2019-03-06 MED ADMIN — cloNIDine HCL (CATAPRES) tablet 0.3 mg: .3 mg | ORAL | @ 14:00:00 | Stop: 2019-03-11

## 2019-03-06 MED ADMIN — ondansetron (ZOFRAN) injection 4 mg: 4 mg | INTRAVENOUS | Stop: 2019-03-11

## 2019-03-06 MED ADMIN — insulin NPH (HumuLIN,NovoLIN) injection 15 Units: 15 [IU] | SUBCUTANEOUS | @ 04:00:00 | Stop: 2019-03-06

## 2019-03-06 MED ADMIN — isosorbide mononitrate (IMDUR) 24 hr tablet 120 mg: 120 mg | ORAL | @ 14:00:00 | Stop: 2019-03-11

## 2019-03-06 MED ADMIN — promethazine (PHENERGAN) 12.5 mg in sodium chloride (NS) 0.9 % 25 mL infusion: 12.5 mg | INTRAVENOUS | @ 04:00:00 | Stop: 2019-03-11

## 2019-03-06 MED ADMIN — enoxaparin (LOVENOX) syringe 40 mg: 40 mg | SUBCUTANEOUS | @ 03:00:00 | Stop: 2019-03-11

## 2019-03-06 MED ADMIN — naloxone (NARCAN) injection 0.4 mg: 0.4 mg | INTRAVENOUS | @ 20:00:00 | Stop: 2019-03-06

## 2019-03-06 MED ADMIN — polyethylene glycol (MIRALAX) packet 17 g: 17 g | ORAL | @ 23:00:00 | Stop: 2019-03-11

## 2019-03-06 MED ADMIN — senna (SENOKOT) tablet 2 tablet: 2 | ORAL | @ 20:00:00 | Stop: 2019-03-11

## 2019-03-06 MED ADMIN — insulin lispro (HumaLOG) injection 1-20 Units: 1-20 [IU] | SUBCUTANEOUS | @ 03:00:00 | Stop: 2019-03-05

## 2019-03-06 NOTE — Unmapped (Signed)
E2 Oncology History and Physical    Assessment/Plan:  Principal Problem:    Non-intractable vomiting with nausea  Active Problems:    Type 2 diabetes mellitus, with long-term current use of insulin (CMS-HCC)    Anemia    (HFpEF) heart failure with preserved ejection fraction (CMS-HCC)    AKI (acute kidney injury) (CMS-HCC)    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 *    Alexandra Richards is a 58 y.o. female with HTN, T2DM, CKD, HFpEF, metastatic breast cancer who presented to [facility] for nausea, diarrhea, and emesis x3 days in the setting of recent chemotherapy/immunotherapy and abx use.     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 Received palliative radiation 9/27-10/7 for a total of 3000 cGy. Treated with capecitabine (Xeloda) 1500 mg bid for 14 days then 7 off, initiated 01/12/19.  Reports compliance with schedule.  Was unfortunately held due to creatinine clearance less than 30.  After repeat lab work recently, creatinine clearance was appropriate and Xeloda was restarted a few days prior to current presentation. Received denosumab on 12/3 as well which has high frequency of nausea, diarrhea.   -Hold chemo while inpatient  -CT chest given pulmonary nodules and effusion c/f worsening disease.   -Consider IP consult for consideration of drainage of likely malignant L effusion.   -If Rivka Barbara is thought to be culprit of current presentation, consider transitioning to alternative agent. #Diarrhea - Nausea, Emesis, reduced PO intake: differential diagnoses for the etiology include chemotherapy side effect, Xgeva side effect, viral gastroenteritis, constipation, colitis, other infectious diarrhea. Previously admitted for similar symptoms found to have colitis and stool burden. No rebounding or guarding, but TTP to deep palpation on L abdomen. Furthermore, endorses recent antibiotic use, thus infectious etiologies must be ruled out. Long history of constipation although reports relatively normal BMs prior to current illness. Recent Xgeva administration which could also be contributing given SE profile of nausea/vomiting. S/p zofran, phenergan, reglan at [facility] ED. COVID negative.   -KUB showing moderate colonic stool burden with mildly distended loops of bowel.  -CT A/P w/o contrast given abdominal tenderness and hx of colitis and hx of CKD.  -IVF 250cc bolus, given HFpEF hx.   -BCx x2 pending.   -UA given complaints of increased frequency and abdominal pain.   -C diff, GIPP pending.   -Antiemetics PRN. Consider scheduling if not improved or escalation to include other agents such as steroids, reglan, zyprexa, etc..  -EKG for QTc assessment in the AM.   -Lyte repletion prn    #HTN: goal per nephrology BP 130-140/~80 to avoid hypotension, unable to tolerate PO meds currently. Last took meds on yesterday. Reports compliance. Significantly hypertensive on admission with SBPs 190-200s. May have some component of rebound HTN from inability to take clonidine.   -IV hydralazine vs labetolol PRN for SBPs >200.   -resume home coreg, clonidine, hydral, imdur, lisinopril when tolerating orals  -clonidine patch to substitute (although usually takes 2-3 days to reach steady state)    #CKD3-4: baseline creatinine 1.5-2.1 - on admission 1.43  -Avoid nephrotoxins  -Avoiding IV contrast   -renally dose medications  -CTM Cr with AM labs.  -Hydration as above. #DM2: on 15 units of 70/30 bid at home. Hyperglycemic on admission.   -Last HbA1c: 11.6  on 9/19.  -NPH 15u BID  -SSI while not tolerating PO    #HFpEF: Last echo 5/20: EF 55% with G2DD and no valvular dz. Reportedly takes Lasix 40mg  PO daily at home. Appears hypo- to euvolemic on exam. Unable to tolerate PO meds currently.   -Appears dry on exam  -IVF gently as mentioned above, careful given HFpEF hx.  -holding diuresis for now.   -Atorvastatin as tolerated.   -Consider addition of Coreg for alpha inhibition and cardioprotective effect.   -Aggressive HTN control as mentioned above.     Daily Checklist:  Diet: Regular  DVT PPx: Lovenox  GI PPx: Not Indicated  Code Status: Full Code, confirmed on admission. 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   ___________________________________________________________________    Chief Complaint:  Non-intractable vomiting with nausea    HPI:  Alexandra Richards is a 57 y.o. female with Alexandra Richards is a 58 year old female with HFpEF, HTN, HLD, T2DM, CKD stage G3b-G4, and stage IV triple-negative breast cancer with metastases to bone and lung on chemotherapy who presents with diarrhea, nausea/vomiting, and inability to tolerate oral intake. Patient was in good state of health until this past Saturday 12/5 when she developed loose, watery stools, mild L sided abdominal discomfort.  Nausea began Sunday evening and persisted over the last days and have prevented her from all oral intake including taking her medications. Emesis has been relatively clear in color, without blood or coffee grounds. Loose stools have been brown in color, without black, tarry, or bloody appearance.   Denies any preceding constipation stating that she was having regular bowel movements every other day.  On Saturday she had one large nonbloody bowel movement and has been having significant diarrhea since.  Her abdominal pain has been present predominately in left abdomen but is not significantly tender to palpation.  States that it is only present with deep palpation and describes it more as a discomfort. She has also had subjective fever, chills, night sweats.  Has not checked her temperature. She denies headache, chest pain, shortness of breath, cough, dysuria, and blood in the urine. She denies recent sick/COVID contacts.  ??  Patient was initially diagnosed with left breast invasive ductal carcinoma with metastases to the bone on 12/18/18. She underwent 10 fractions of palliative radiation from 9/25-10/7 and was then started on capecitabine on 10/15 given a negative PDL1. She last took capecitabine on 12/7 (which was the first day of her 14 days on, 7 days off schedule). She was also recently started on denosumab Rivka Barbara) which she last received on 12/3.  She seems to believe that she began feeling poorly after the Xgeva shot.  ??  Notably, she has had 2 admissions for nausea and vomiting in the last 3 months, one admission after treatment with radiation and one secondary to colitis and constipation. She received zofran in route with EMS without relief. On admission to the ED, she was afebrile with HR 109, BP 207/110, and oxygen saturation 97% on room air. Labs were significant for WBC 10.8, hemoglobin 11.1, platelet 485, bicarbonate 19, creatinine 1.46, normal LFTs, and normal lipase. She was persistently nauseated and vomiting, even after receiving IV phenergan 12.5 mg x 2.  Upon my evaluation, patient is uncomfortable appearing, tachycardic, hypertensive, afebrile.  Continuing to have diarrhea and abdominal pain.  Mental status appropriate.  She does notably endorse antibiotic use (Keflex) within the last 3 weeks for toenail infection.  We will  work on nausea control, work-up for diarrhea, and hydration.    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  -- Prominent left axillary lymphadenopathy measuring up to 1.6 cm, concerning for nodal metastatic involvement.  -- Numerous bilateral pulmonary nodules throughout all lung lobes, measuring up to 1.0 cm, concerning for metastatic disease.  -- Numerous lytic osseous lesions throughout the axial and appendicular skeleton, consistent with osseous metastatic disease.    CT A/P --Numerous lytic lesions of the pelvis and proximal femurs, with pathologic fractures of the bilateral sacrum extending into the sacral foramina.     11/2018 -  Presenting Symptoms    Developed right breast soreness and progressive pain pelvic and hip pain.      12/18/2018 Biopsy    Left breast 3:00 core needle biopsy.  Grade 3 ER negative, PR negative, HER-2 negative by IHC invasive ductal carcinoma.     12/21/2018 - 01/02/2019 Radiation    Palliative radiation to 3000 cGy at 300 cGy/fraction for a total of 10 fractions.      01/10/2019 - 01/10/2019 Chemotherapy    OP BREAST ABRAXANE ATEZOLIZUMAB  atezolizumab 840 mg IV on days 1, 15, nab-PACLitaxel 100 mg/m2 IV on days 1, 8, 15, every 28 days until disease progression or DLT       Allergies:  Patient has no known allergies.    Medications:   Prior to Admission medications    Medication Dose, Route, Frequency   blood sugar diagnostic (ON CALL EXPRESS TEST STRIP) Strp Use to check blood sugar by Other route Four (4) times a day (before meals and nightly).   acetaminophen (TYLENOL) 325 MG tablet 650 mg, Oral, Every 6 hours PRN   amLODIPine (NORVASC) 5 MG tablet 10 mg, Oral, Daily (standard)   atorvastatin (LIPITOR) 40 MG tablet 40 mg, Oral, Nightly   capecitabine (XELODA) 500 MG tablet 1,500 mg, Oral, 2 times a day (standard), For 14 days on then 7 days off   carvediloL (COREG) 25 MG tablet 25 mg, Oral, 2 times a day (standard)   cholecalciferol, vitamin D3, 1,000 unit (25 mcg) tablet 2,000 Units, Oral, Daily (standard)   cloNIDine HCL (CATAPRES) 0.3 MG tablet 0.3 mg, Oral, 2 times a day (standard)   furosemide (LASIX) 40 MG tablet 40 mg, Oral, Daily (standard), Take additional 40 mg if weight gain >3 lb in 1 day or leg swelling    hydrALAZINE (APRESOLINE) 100 MG tablet 100 mg, Oral, 3 times a day (standard) insulin NPH-insulin regular, 70/30, (HUMULIN/NOVOLIN) 100 unit/mL (70-30) injection 15 Units, Subcutaneous, 2 times a day (AC), Can increase by 5 units if glucose is greater than 140.   isosorbide mononitrate (IMDUR) 60 MG 24 hr tablet 120 mg, Oral, Daily (standard)   lisinopriL (PRINIVIL,ZESTRIL) 20  MG tablet 20 mg, Oral, Daily (standard)   melatonin 5 mg Tab 5 mg, Oral, Nightly   mirtazapine (REMERON) 15 MG tablet 15 mg, Oral, Nightly, For sleep and appetite;   oxyCODONE (ROXICODONE) 10 mg immediate release tablet 10 mg, Oral, Every 6 hours PRN   polyethylene glycol (MIRALAX) 17 gram/dose powder 17 g, Oral, 2 times a day (standard)   promethazine (PHENERGAN) 25 MG suppository 25 mg, Rectal, Every 8 hours PRN   senna (SENOKOT) 8.6 mg tablet 2 tablets, Oral, 2 times a day (standard)     Medical History:  Past Medical History:   Diagnosis Date   ??? Cancer (CMS-HCC)    ??? Diabetes mellitus (CMS-HCC)    ??? HLD (hyperlipidemia)    ??? Hypertension    ??? Non-intractable vomiting with nausea 01/04/2019   ??? Renal disorder      Surgical History:  No past surgical history on file.    Social History:  Social History     Socioeconomic History   ??? Marital status: Single     Spouse name: Not on file   ??? Number of children: 3   ??? Years of education: Not on file   ??? Highest education level: Not on file   Occupational History   ??? Not on file   Social Needs   ??? Financial resource strain: Not on file   ??? Food insecurity     Worry: Often true     Inability: Often true   ??? Transportation needs     Medical: No     Non-medical: No   Tobacco Use   ??? Smoking status: Former Smoker     Types: Cigarettes   ??? Smokeless tobacco: Never Used   Substance and Sexual Activity   ??? Alcohol use: No     Alcohol/week: 0.0 standard drinks   ??? Drug use: Yes     Types: Marijuana     Comment: Last use in April 2020   ??? Sexual activity: Not Currently   Lifestyle   ??? Physical activity     Days per week: Not on file     Minutes per session: Not on file ??? Stress: Not on file   Relationships   ??? Social Wellsite geologist on phone: Not on file     Gets together: Not on file     Attends religious service: Not on file     Active member of club or organization: Not on file     Attends meetings of clubs or organizations: Not on file     Relationship status: Not on file   Other Topics Concern   ??? Not on file   Social History Narrative    Lives alone in [place].  Never married.  3 children (1 son and 2 daughters).  Son live in [place] and she plans to move to an apartment near him. 23 grandchildren.   Former smoker.  Half PPD x5-10 years.  Quit in 62s.  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  Associates degree in computer programming and bachelors in hospitality/tourism.     Family History:  History reviewed. No pertinent family history.    Review of Systems:  10 systems were reviewed and are negative unless otherwise mentioned in HPI.    Labs/Studies:  Labs and studies from the last 24 hours were reviewed in EMR.    Physical Exam:  Vitals:    03/05/19 1732   BP: 211/102  Pulse: 116   Resp: 20   Temp: 37.2 ??C   SpO2: 99%     General: Uncomfortable appearing female in mild distress.  Skin: Warm and dry. No rashes.  Head and Neck: Neck supple. No cervical or supraclavicular adenopathy.  Eyes: Conjunctivae and lids normal. Pupils equal. Full extraocular range of motion.  Cardiovascular: Tachycardic, regular rhythm. No murmurs.   Pulmonary: Normal work of breathing.  Mildly decreased sounds on the L base.   GI: Normal bowel sounds. Abdomen soft and non-distended. Mild tenderness to deep palpation on LU and LL quadrants.   Extremities: No peripheral edema.  Neurologic: Alert and oriented x 3. Cranial nerves II-XII grossly intact.

## 2019-03-06 NOTE — Unmapped (Signed)
This patient was not seen in person. The clinical nutrition service has moved to a liaison model to minimize potential spread of COVID-19, protect patients/providers and reduce PPE utilization.  During this time, we will be limiting person-to-person contact when possible.    Adult Nutrition Assessment Note    Visit Type: RN Consult  Reason for Visit: Have you gained or lost 10 pounds in the past 3 months?, Have you had a decrease in food intake or appetite?    ASSESSMENT:   HPI & PMH: Alexandra Richards is a 58 y.o. female with Alexandra Richards is a 58 year old female with HFpEF, HTN, HLD, T2DM, CKD stage??G3b-G4, and??stage IV??triple-negative breast cancer with metastases??to bone and lung??on chemotherapy??who presents with diarrhea, nausea/vomiting, and inability to tolerate oral intake.  ??  Patient was in good state of health until this past Saturday 12/5 when she developed loose, watery stools, mild L sided abdominal discomfort.  Nausea began Sunday evening and persisted over the last days and have prevented her from all oral intake including taking her medications. Emesis has been relatively clear in color, without blood or coffee grounds. Loose stools have been brown in color, without black, tarry, or bloody appearance.??  Denies any preceding constipation stating that she was having regular bowel movements every other day.  On Saturday she had one large nonbloody bowel movement and has been having significant diarrhea since.  Her abdominal pain has been present predominately in left abdomen but is not significantly tender to palpation.  States that it is only present with deep palpation and describes it more as a discomfort.??She has also had subjective fever, chills, night sweats.    Past Medical History:   Diagnosis Date   ??? Cancer (CMS-HCC)    ??? Diabetes mellitus (CMS-HCC)    ??? HLD (hyperlipidemia)    ??? Hypertension    ??? Non-intractable vomiting with nausea 01/04/2019   ??? Renal disorder Nutrition Hx: RD working remotely for infection prevention, spoke with patient via hospital phone:  RN report N/V symptoms subsided overnight with meds. Pending breakfast delivery.  Pt reports she was eating and drinking WNL up until onset admitting symptoms 03/02/19. She denies N/V this am but remains lack appetite. She has had weight loss over months but unsure amount. Her UBW was 200#'s when well.  She has been followed by Dietitians at OSH for low appetite which was about 3 months prior to her dx in 11/2018.  She had tolerated supplements ensure clear and ensure max prot well which she was wanting to restart this admission.    Nutritionally Pertinent Meds: Vitamin D, Insulin  Labs: POC Glucose 194/278  Lab Results   Component Value Date    NA 134 (L) 03/06/2019    K 4.1 03/06/2019    CL 103 03/06/2019    CO2 22.0 03/06/2019    BUN 18 03/06/2019    CREATININE 1.51 (H) 03/06/2019    GFR >= 60 08/19/2011    GLU 206 (H) 03/06/2019    CALCIUM 8.2 (L) 03/06/2019    ALBUMIN 3.7 03/06/2019    PHOS 4.9 (H) 02/07/2019          Skin:   Patient Lines/Drains/Airways Status    Active Wounds     None               Current nutrition therapy order:   Nutrition Orders          Supplement Adult; Ensure Max Protein (High Protein/Low Cal/Low Carb); #  of Products PER Serving: 1 At [employer] starting at 12/09 1200    Supplement Adult; Ensure Clear (Clear Liquid); # of Products PER Serving: 1 2xd PC starting at 12/09 0900    Nutrition Therapy Regular/House starting at 12/08 2022           Anthropometric Data:  -- Height: 157.5 cm (5' 2)   -- Last recorded weight: 79.4 kg (175 lb)  -- Admission weight: 79.4 kg (175 lb)  -- IBW: 49.97 kg  -- Percent IBW: 158.85 %  -- BMI: Body mass index is 32.01 kg/m??.   -- Weight changes this admission:   Last 5 Recorded Weights    03/05/19 1250   Weight: 79.4 kg (175 lb) -- Weight history PTA: 5.4% increase since 02/11/19;  11.6% loss since 10/19/18, significant; 12.5% loss since 08/10/18, significant;   Wt Readings from Last 10 Encounters:   03/05/19 79.4 kg (175 lb)   02/11/19 75.3 kg (166 lb)   02/07/19 88.6 kg (195 lb 5.2 oz)   01/24/19 85.5 kg (188 lb 9.6 oz)   01/04/19 81.7 kg (180 lb 3.2 oz)   12/18/18 85.8 kg (189 lb 2.5 oz)   10/19/18 90.2 kg (198 lb 12.8 oz)   08/10/18 90.9 kg (200 lb 8 oz)   08/04/18 95.3 kg (210 lb)   01/03/18 80.1 kg (176 lb 9.4 oz)        Daily Estimated Nutrient Needs:   Energy: 1313 x 1.15-1.3 SF=1510-1707 kcals [Per Mifflin St-Jeor Equation using admission body weight, 79 kg (03/06/19 0837)]  Protein: 79-95 gm [1.0-1.2 gm/kg using admission body weight, 79 kg (03/06/19 0837)]  Carbohydrate:   [45-60% of kcal]  Fluid: 2370 [30 mL/kg]     Nutrition Focused Physical Exam:                   Nutrition Evaluation  Overall Impressions: Unable to perform Nutrition-Focused Physical Exam at this time due to (comment) (03/06/19 1610)  Nutrition focused physical exam not completed in an effort to minimize contact between patient and provider due to infectious disease outbreak.    Nutrition Designation: Obese class I  (BMI 30.00 - 34.99 kg/m2) (03/06/19 0836)     DIAGNOSIS:  Malnutrition Assessment using AND/ASPEN Clinical Characteristics:    Non-severe (Moderate) Protein-Calorie Malnutrition in the context of chronic illness (03/06/19 0951)        Energy Intake: < 75% of estimated energy requirement for > or equal to 1 month  Interpretation of Wt. Loss: > or equal to 10% x 6 month Overall nutrition impression: Pt appetite had been low for months and improved this past month with some weight gain but return poor oral few days ago with admitting symptoms. He symptoms reflective of likely her chemo and recent radiation. CMP labs indications she is dry.   PRN antiemetics are currently helping symptoms. She will need restart prior diet ed for issues with N/V by previous RD assessments and restart oral supplementation. RD will continue monitor glucose levels but hold diet restrictions at present with poor oral intake.     - Malnutrition related to chronic disease and chemo-radiation side effects as evidenced by weight loss, low oral intake.     GOALS:  Oral Intake:       - No s/s of dehydration  Anthropometric:       - No greater than 1% weight loss in week.     RECOMMENDATIONS AND INTERVENTIONS:  Continue regular diet   --restart prior diet review  for N/V symptoms (reminded pt)  Added ensure clear BID and ensure max once daily  Continue daily weight per unit protocol  Keep fluids at bedside for frequent hydration    RD Follow Up Parameters:  1-2 times per week (and more frequent as indicated)     Ed Blalock, MS, RD, CSO, LDN  Pager # [phone]

## 2019-03-06 NOTE — Unmapped (Addendum)
Alexandra Richards is a 58 year old female with HFpEF, HTN, HLD, T2DM, CKD stage G3b-G4, and stage IV triple-negative breast cancer with metastases to bone and lung on chemotherapy who presents with diarrhea, nausea/vomiting, and inability to tolerate oral intake. On admission, Alexandra Richards's BP was 207/110, likely due to her inability to take her PO clonidine at home with rebound hypertension. Patient underwent CT abdomen to evaluate for possible infection. This showed no infection but significant stool burden. Nausea was controlled with IV phenergan and patient was able to tolerate her home blood pressure meds. These were restarted slowly. At discharge she was on her home regimen, although hydralazine TID was held as her pressures were appropriate this hospitalization. Infectious workup for her diarrhea and nausea was negative and it was determined to likely be due to severe constipation. With aggressive bowel regimen, patient began to feel better.     Of note, on HD1 patient had an episode in which she became suddenly unresponsive to sternal rub. Rapid response was called (BP and glucose were appropriate). Neuro called stat to bedside for evaluation of possible stroke. CT head was negative and patient eventually improved after Narcan administration (patient had not received any opioids at that point in the hospitalization). A very similar event occurred on HD2 without resolution after Narcan administration. Patient was stable on RA, placed on continuous pulse ox monitoring for close monitoring, patient came to a few hours later and stated she is a deep sleeper. MRI head was ordered to evaluate for possible leptomeningeal spread of her cancer which was negative. Patient persistently hyperglycemic on NPH 15u BID (on 15u 70:30 at home). Increased this to NPH 20uBID and discharged the patient on 70:30 20u at home. Patient was instructed to follow up with PCP for further titration of home antihypertensives and insulin regimen.     Patient with decent sized pleural effusion on chest CT, this may be a malignant effusion. Patient was informed of the presence of this effusion, however she remained comfortable on RA throughout the hospitalization, so elected to continue to monitor this effusion as an outpatient rather than intervene this hospitalization.

## 2019-03-06 NOTE — Unmapped (Signed)
Pt alert and oriented, drowsy and lethargic at the beginning of the shift. Pt hypertensive, rapid nurse consulted, HCP at bedside.  HCP ordered Antihypertensive medications as well and antiemetics to control Nausea and Vomiting.  See provider notes and MAR.  Otherwise VSS.  Pt remains free from falls and afebrile.  Pt denies pain.  Pt remains oxygen dependent with 2 L oxygen in place via [place] for comfort.  Safety measures remain in place with bed alarm on.  Will continue to monitor.    Vitals:    03/05/19 2351 03/06/19 0047 03/06/19 0101 03/06/19 0312   BP: 195/95 176/89  169/90   Pulse:  100 104 103   Resp:  22  20   Temp:  37.6 ??C (99.7 ??F)  37.5 ??C (99.5 ??F)   TempSrc:  Oral  Oral   SpO2:  100%  100%   Weight:       Height:            Problem: Adult Inpatient Plan of Care  Goal: Plan of Care Review  Outcome: Progressing  Flowsheets (Taken 03/06/2019 0725)  Progress: no change  Plan of Care Reviewed With: patient  Goal: Patient-Specific Goal (Individualization)  Outcome: Progressing  Flowsheets (Taken 03/06/2019 0725)  Patient-Specific Goals (Include Timeframe): Patient would like to get her blood pressure back under control without nausea and vomiting.  Individualized Care Needs: Cluster care, empathetic listening, presence, close monitoring.  Anxieties, Fears or Concerns: Patient is concerned about getting her BP, Nausea and Vomiting under control.  Goal: Absence of Hospital-Acquired Illness or Injury  Outcome: Progressing  Intervention: Identify and Manage Fall Risk  Flowsheets (Taken 03/06/2019 0725)  Safety Interventions:   aspiration precautions   assistive device   bed alarm   commode/urinal/bedpan at bedside   environmental modification   fall reduction program maintained   isolation precautions   lighting adjusted for tasks/safety   low bed   mobility aid   nonskid shoes/slippers when out of bed   room near unit station   supervised activity   toileting scheduled  Intervention: Prevent Skin Injury Flowsheets (Taken 03/06/2019 0751)  Pressure Reduction Techniques: frequent weight shift encouraged  Intervention: Prevent VTE (venous thromboembolism)  Flowsheets (Taken 03/06/2019 0751)  VTE Prevention/Management: ambulation promoted  Intervention: Prevent Infection  Flowsheets (Taken 03/06/2019 0751)  Infection Prevention:   cohorting utilized   environmental surveillance performed   equipment surfaces disinfected   handwashing promoted   personal protective equipment utilized   rest/sleep promoted   single patient room provided   visitors restricted/screened  Goal: Optimal Comfort and Wellbeing  Outcome: Progressing  Intervention: Monitor Pain and Promote Comfort  Flowsheets (Taken 03/06/2019 0751)  Pain Management Interventions:   care clustered   pain management plan reviewed with patient/caregiver   quiet environment facilitated  Intervention: Provide Person-Centered Care  Flowsheets (Taken 03/06/2019 0751)  Trust Relationship/Rapport:   care explained   choices provided   emotional support provided   empathic listening provided   questions answered   questions encouraged   reassurance provided   thoughts/feelings acknowledged  Goal: Readiness for Transition of Care  Outcome: Progressing  Goal: Rounds/Family Conference  Outcome: Progressing

## 2019-03-06 NOTE — Unmapped (Signed)
Neurology   Initial Consult Note        Assessment and Plan          Alexandra Richards is a 58 y.o.  female with a past medical history significant of metastatic breast cancer, HTN, T2DM, CKD, HFpEF for who presents as a BAT Code for increased somnolence. Patient arousable to noxious. Oriented to person, place and year when repeatedly stimulated. NIHSS 10, mainly for poor motor effort and somnolence. No focal deficits. LKN 1410.  A CT head was obtained and was unremarkable.  Low suspicion for LVO and so vascular imaging was deferred.  Upon return to floor, the patient was administered Narcan, with improvement of her somnolence.  Primary team uncertain as to how patient had received opioids prior as no recent opioids were given per [facility].    RECOMMENDATIONS:  1.  Continue supportive care per primary team  2.  No further neurological interventions warranted at this time    Neurology will sign off. This patient was seen and discussed with Dr. Raenette Rover who agrees with the above assessment and plan.      Alexandra Son, MD  PGY-2, Neurology       ATTESTATION NOTE:  I saw and evaluated the patient, participating in the key portions of the service.  I reviewed the resident???s note and agree with the resident???s findings and plan as documented in their note.  Tawanna Cooler, MD        Stroke Specific HPI        PCP: Tyson Babinski [facility]  Primary neurologist: N/A    Chief Complaint: BAT Code    HPI:  Arrival Time: 1248 03/05/19   Date/Time of last known normal (provider): 14:00 today    Time of initial assessment: 14:37 today  Assessment performed in person at bedside.  Code stroke/BAT code/code IA called?: yes  Time code stroke cancelled (if applicable): 14:37    Time non-contrast CT head was personally reviewed: at time of acquisition    Initial NIHSS total score: 10  ABCD2 score (if NIHSS < 4): N/A   Modified Rankin Scale (mRS) pre-stroke: 0 = No symptoms at all; no limitations and no symptoms Eligible for alteplase?: NO: Contraindications - Other (see note for details)  Time of treatment decision made at: N/A  Alteplase bolus initiated at: N/A   Reason for treatment delay? N/A    Was workup for large vessel occlusion indicated? No:  No proximal occlusion    Was patient referred for endovascular treatment? NO - Other (see note for details)    Eligible for stroke trial?: no      The patient is seen in consultation at the request of heme Onc for evaluation of stroke symptoms, which included increased somnolence.    Alexandra Richards is a 58 y.o.  female with a past medical history significant of metastatic breast cancer, HTN, T2DM, CKD, HFpEF for who presents with AMS.    Patient was awake, alert and oriented x4 this morning, intermittently drowsy but easily arousable to voice up to 1400.  Around 1410, nursing noticed excessive somnolence, no longer arousing to voice. A rapid response was called, which was converted to a BAT Code. Neurology was consulted.  Patient somnolent but arousable to noxious on exam.  No focal neurological deficits.  With repetitive stimulation patient was able to appropriately respond to orientation questions. A CTH was obtained.     Stroke risk factors: diabetes mellitus, hypertension and hypercoagulable state.  Past Medical History:  Past Medical History:   Diagnosis Date   ??? Cancer (CMS-HCC)    ??? Diabetes mellitus (CMS-HCC)    ??? HLD (hyperlipidemia)    ??? Hypertension    ??? Non-intractable vomiting with nausea 01/04/2019   ??? Renal disorder        Past Surgical History:  No past surgical history on file.    Social History:  Social History     Socioeconomic History   ??? Marital status: Single     Spouse name: None   ??? Number of children: 3   ??? Years of education: None   ??? Highest education level: None   Occupational History   ??? None   Social Needs   ??? Financial resource strain: None   ??? Food insecurity     Worry: Often true     Inability: Often true   ??? Transportation needs Medical: No     Non-medical: No   Tobacco Use   ??? Smoking status: Former Smoker     Types: Cigarettes   ??? Smokeless tobacco: Never Used   Substance and Sexual Activity   ??? Alcohol use: No     Alcohol/week: 0.0 standard drinks   ??? Drug use: Yes     Types: Marijuana     Comment: Last use in April 2020   ??? Sexual activity: Not Currently   Lifestyle   ??? Physical activity     Days per week: None     Minutes per session: None   ??? Stress: None   Relationships   ??? Social Wellsite geologist on phone: None     Gets together: None     Attends religious service: None     Active member of club or organization: None     Attends meetings of clubs or organizations: None     Relationship status: None   Other Topics Concern   ??? None   Social History Narrative    Lives alone in [place].  Never married.  3 children (1 Richards and 2 daughters).  Richards live in [place] and she plans to move to an apartment near him. 23 grandchildren.   Former smoker.  Half PPD x5-10 years.  Quit in 21s.  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  Associates degree in computer programming and bachelors in hospitality/tourism.       Family History:  History reviewed. No pertinent family history.    Home Medications:  Medications Prior to Admission   Medication Sig Dispense Refill Last Dose   ??? blood sugar diagnostic (ON CALL EXPRESS TEST STRIP) Strp Use to check blood sugar by Other route Four (4) times a day (before meals and nightly). 100 strip 0 Unknown at Unknown time   ??? CALCIUM ORAL Take 1 tablet by mouth.      ??? acetaminophen (TYLENOL) 325 MG tablet Take 650 mg by mouth every six (6) hours as needed.       ??? amLODIPine (NORVASC) 5 MG tablet Take 2 tablets (10 mg total) by mouth daily.      ??? atorvastatin (LIPITOR) 40 MG tablet Take 40 mg by mouth nightly.       ??? capecitabine (XELODA) 500 MG tablet Take 3 tablets (1,500 mg total) by mouth Two (2) times a day . For 14 days on then 7 days off 84 tablet 1 ??? carvediloL (COREG) 25 MG tablet Take 1 tablet (25 mg total) by mouth  Two (2) times a day. 60 tablet 0    ??? cholecalciferol, vitamin D3, 1,000 unit (25 mcg) tablet Take 2 tablets (2,000 Units total) by mouth daily. 60 tablet 11    ??? cloNIDine HCL (CATAPRES) 0.3 MG tablet Take 0.3 mg by mouth Two (2) times a day.       ??? furosemide (LASIX) 40 MG tablet Take 40 mg by mouth daily. Take additional 40 mg if weight gain >3 lb in 1 day or leg swelling      ??? hydrALAZINE (APRESOLINE) 100 MG tablet Take 1 tablet (100 mg total) by mouth Three (3) times a day. 90 tablet 0    ??? insulin NPH-insulin regular, 70/30, (HUMULIN/NOVOLIN) 100 unit/mL (70-30) injection Inject 0.15 mL (15 Units total) under the skin Two (2) times a day (30 minutes before a meal). Can increase by 5 units if glucose is greater than 140. 9 mL 0    ??? isosorbide mononitrate (IMDUR) 60 MG 24 hr tablet Take 2 tablets (120 mg total) by mouth daily. 180 tablet 0    ??? lisinopriL (PRINIVIL,ZESTRIL) 20 MG tablet Take 20 mg by mouth daily.      ??? melatonin 5 mg Tab Take 5 mg by mouth nightly.      ??? mirtazapine (REMERON) 15 MG tablet Take 1 tablet (15 mg total) by mouth nightly. For sleep and appetite; 30 tablet 2    ??? [EXPIRED] ondansetron (ZOFRAN-ODT) 4 MG disintegrating tablet Take 1 tablet (4 mg total) by mouth every six (6) hours as needed for nausea. (Patient not taking: Reported on 02/07/2019) 30 tablet 0    ??? oxyCODONE (ROXICODONE) 10 mg immediate release tablet Take 1 tablet (10 mg total) by mouth every six (6) hours as needed for pain. 90 tablet 0    ??? polyethylene glycol (MIRALAX) 17 gram/dose powder Take 17 g by mouth Two (2) times a day. 3060 g 0    ??? promethazine (PHENERGAN) 25 MG suppository Insert 1 suppository (25 mg total) into the rectum every eight (8) hours as needed for nausea (vomiting) for up to 30 doses. 30 suppository 0    ??? senna (SENOKOT) 8.6 mg tablet Take 2 tablets by mouth Two (2) times a day. 120 tablet 2 Medications Administered at Hospital:  Current Facility-Administered Medications   Medication Dose Route Frequency Provider Last Rate Last Dose   ??? acetaminophen (TYLENOL) tablet 650 mg  650 mg Oral Q4H PRN Ahad Abid, MD       ??? aluminum-magnesium hydroxide-simethicone (MAALOX MAX) 80-80-8 mg/mL oral suspension  30 mL Oral Q4H PRN Alanson Puls, MD       ??? amLODIPine (NORVASC) tablet 10 mg  10 mg Oral Daily Alanson Puls, MD   10 mg at 03/06/19 0053   ??? atorvastatin (LIPITOR) tablet 40 mg  40 mg Oral Nightly Alanson Puls, MD       ??? cholecalciferol (vitamin D3) tablet 2,000 Units  2,000 Units Oral Daily Alanson Puls, MD   2,000 Units at 03/06/19 0909   ??? cloNIDine HCL (CATAPRES) tablet 0.3 mg  0.3 mg Oral BID Ahad Abid, MD   0.3 mg at 03/06/19 0909   ??? dextrose 50 % in water (D50W) 50 % solution 12.5 g  12.5 g Intravenous Q10 Min PRN Alanson Puls, MD       ??? enoxaparin (LOVENOX) syringe 40 mg  40 mg Subcutaneous Q24H Alanson Puls, MD   40 mg at 03/05/19 2151   ??? hydrALAZINE (APRESOLINE) injection 10 mg  10 mg Intravenous Q4H PRN Alanson Puls, MD       ??? insulin lispro (HumaLOG) injection 1-20 Units  1-20 Units Subcutaneous ACHS Alanson Puls, MD   2 Units at 03/06/19 1416   ??? insulin NPH (HumuLIN,NovoLIN) injection 8 Units  8 Units Subcutaneous Q12H [facility] Kelvin Cellar, MD   8 Units at 03/06/19 1013   ??? isosorbide mononitrate (IMDUR) 24 hr tablet 120 mg  120 mg Oral Daily Ahad Abid, MD   120 mg at 03/06/19 0909   ??? lisinopriL (PRINIVIL,ZESTRIL) tablet 20 mg  20 mg Oral Daily Ahad Abid, MD   20 mg at 03/06/19 0909   ??? melatonin tablet 3 mg  3 mg Oral Nightly PRN Alanson Puls, MD   3 mg at 03/06/19 0054   ??? ondansetron (ZOFRAN) injection 4 mg  4 mg Intravenous Q8H PRN Alanson Puls, MD   4 mg at 03/06/19 1856   ??? polyethylene glycol (MIRALAX) packet 17 g  17 g Oral BID Kelvin Cellar, MD   17 g at 03/06/19 1813   ??? promethazine (PHENERGAN) 12.5 mg in sodium chloride (NS) 0.9 % 25 mL infusion  12.5 mg Intravenous Q6H PRN Alanson Puls, MD   12.5 mg at 03/05/19 2302 ??? senna (SENOKOT) tablet 2 tablet  2 tablet Oral BID Kelvin Cellar, MD   2 tablet at 03/06/19 1519       Allergies:  No Known Allergies       Review of Systems   Review of systems was unobtainable due to patient factors.       Objective        Temp:  [36.5 ??C-37.6 ??C] 36.5 ??C  Heart Rate:  [71-125] 80  Resp:  [16-22] 16  BP: (125-204)/(61-112) 138/72  MAP (mmHg):  [94-134] 94  SpO2:  [96 %-100 %] 98 %  No intake/output data recorded.    Physical Exam:  General Appearance: Tired appearring.   HEENT: Head is atraumatic and normocephalic. Sclera anicteric without injection. Oropharyngeal membranes are moist with no erythema or exudate.  Neck: Deferred.  Lungs: Normal work of breathing.  Heart: Warm and well-perfused.  Abdomen: Nondistended.  Extremities: No clubbing, cyanosis, or edema.    Neurological Examination:   Mental Status:   somnolent. GCS 11. Attention and concentration are  impaired. The patient follows >50% of simple and complex commands. Speech with no  dysarthria. Able to name and repeat with mild difficulty.     Cranial Nerves:   Visual fields intact. Double simultaneous visual stimulation shows no extinction. PERRLA.   Extraocular movements are intact with no nystagmus.   At rest face symmetrical. Normal facial movement bilaterally, including forehead, eye closure and grimace/smile.   Facial sensation intact bilaterally to light touch in all three divisions of CNV.   Hearing grossly intact bilaterally.   Palate movement is symmetric.   Tongue protrudes midline. Tongue movements are normal.     Sensory exam:   Intact and symmetric to noxious    Motor Exam:   All Extremities antigravity throughout.    Reflexes:   Right:  Biceps 2+, triceps 2+, knee 2+, achilles 1+.   Left:  Biceps 2+, triceps 2+, knee 2+, achilles 1+.   Toes are down going bilaterally.    Cerebellar/Coordination: Unable to test due to altered mental status    Gait: deferred    NIHSS on arrival: (1a.) Level of Consciousness:  2 = Not alert, requires repeated stimulation to attend, or is obtunded and requires strong or  painful stimulation to make movements (not stereotyped)   (1b.) LOC Questions:  0 = Answers both questions correctly   (1c.) LOC Commands:  0 = Performs both tasks correctly   (2.)   Best Gaze:  0 = Normal   (3.)   Visual:  0 = No visual loss   (4.)   Facial Palsy:  0 = Normal symmetric movement   (5a.) Motor Arm, Left:  2 = Some effort against gravity, limb cannot get to or maintain (if cured) 90 (or 45) degrees, drifts down to bed, but has some effort against gravity   (5b.) Motor Arm, Right:  2 = Some effort against gravity, limb cannot get to or maintain (if cured) 90 (or 45) degrees, drifts down to bed, but has some effort against gravity   (6a.) Motor Leg, Left:  2 = Some effort against gravity, limb cannot get to or maintain (if cured) 90 (or 45) degrees, drifts down to bed, but has some effort against gravity   (6b.) Motor Leg, Right:  2 = Some effort against gravity, limb cannot get to or maintain (if cured) 90 (or 45) degrees, drifts down to bed, but has some effort against gravity   (7.)   Limb Ataxia:  0 = Absent   (8.)   Sensory:  0 = Normal; no sensory loss   (9.)   Best Language:  0 = No aphasia, normal   (10.) Dysarthria:  0 = Normal   (11.) Extinction and Inattention:  0 = No abnormality   NIHSS Total Score:  10        Diagnostic Studies      All Labs Last 24hrs:   Recent Results (from the past 24 hour(s))   Magnesium Level    Collection Time: 03/05/19  9:14 PM   Result Value Ref Range    Magnesium 1.6 1.6 - 2.2 mg/dL   Comprehensive Metabolic Panel    Collection Time: 03/05/19  9:14 PM   Result Value Ref Range    Sodium 135 135 - 145 mmol/L    Potassium 4.2 3.5 - 5.0 mmol/L    Chloride 104 98 - 107 mmol/L    Anion Gap 16 (H) 7 - 15 mmol/L    CO2 15.0 (L) 22.0 - 30.0 mmol/L    BUN 18 7 - 21 mg/dL    Creatinine 0.98 (H) 0.60 - 1.00 mg/dL    BUN/Creatinine Ratio 13 EGFR CKD-EPI Non-African American, Female 40 (L) >=60 mL/min/1.82m2    EGFR CKD-EPI African American, Female 46 (L) >=60 mL/min/1.52m2    Glucose 316 (H) 70 - 179 mg/dL    Calcium 8.5 8.5 - 11.9 mg/dL    Albumin 3.9 3.5 - 5.0 g/dL    Total Protein 7.6 6.5 - 8.3 g/dL    Total Bilirubin 0.7 0.0 - 1.2 mg/dL    AST 25 14 - 38 U/L    ALT 10 <35 U/L    Alkaline Phosphatase 92 38 - 126 U/L   CBC w/ Differential    Collection Time: 03/05/19  9:14 PM   Result Value Ref Range    WBC 10.2 4.5 - 11.0 10*9/L    RBC 3.49 (L) 4.00 - 5.20 10*12/L    HGB 10.9 (L) 12.0 - 16.0 g/dL    HCT 14.7 (L) 82.9 - 46.0 %    MCV 97.0 80.0 - 100.0 fL    MCH 31.3 26.0 - 34.0 pg    MCHC 32.2 31.0 - 37.0 g/dL  RDW 18.4 (H) 12.0 - 15.0 %    MPV 8.6 7.0 - 10.0 fL    Platelet 507 (H) 150 - 440 10*9/L    Neutrophils % 87.1 %    Lymphocytes % 5.2 %    Monocytes % 5.7 %    Eosinophils % 1.0 %    Basophils % 0.2 %    Neutrophil Left Shift 1+ (A) Not Present    Absolute Neutrophils 8.9 (H) 2.0 - 7.5 10*9/L    Absolute Lymphocytes 0.5 (L) 1.5 - 5.0 10*9/L    Absolute Monocytes 0.6 0.2 - 0.8 10*9/L    Absolute Eosinophils 0.1 0.0 - 0.4 10*9/L    Absolute Basophils 0.0 0.0 - 0.1 10*9/L    Large Unstained Cells 1 0 - 4 %    Macrocytosis Moderate (A) Not Present    Anisocytosis Moderate (A) Not Present    Hypochromasia Slight (A) Not Present   POCT Glucose    Collection Time: 03/05/19  9:27 PM   Result Value Ref Range    Glucose, POC 278 (H) 70 - 179 mg/dL   Blood Gas Critical Care Panel, Venous    Collection Time: 03/05/19 11:25 PM   Result Value Ref Range    Specimen Source Venous     FIO2 Venous Not Specified     pH, Venous 7.44 (H) 7.32 - 7.43    pCO2, Ven 27 (L) 40 - 60 mm Hg    pO2, Ven 80 (H) 30 - 55 mm Hg    HCO3, Ven 18 (L) 22 - 27 mmol/L    Base Excess, Ven -5.2 (L) -2.0 - 2.0    O2 Saturation, Venous 96.7 (H) 40.0 - 85.0 %    Sodium Whole Blood 135 135 - 145 mmol/L    Potassium, Bld 3.5 3.4 - 4.6 mmol/L Calcium, Ionized Venous 4.32 (L) 4.40 - 5.40 mg/dL    Glucose Whole Blood 320 (H) 70 - 179 mg/dL    Lactate, Venous 1.6 0.5 - 1.8 mmol/L    Hgb, blood gas 10.80 (L) 12.00 - 16.00 g/dL   ECG 12 Lead    Collection Time: 03/05/19 11:25 PM   Result Value Ref Range    EKG Systolic BP  mmHg    EKG Diastolic BP  mmHg    EKG Ventricular Rate 120 BPM    EKG Atrial Rate 120 BPM    EKG P-R Interval 164 ms    EKG QRS Duration 92 ms    EKG Q-T Interval 324 ms    EKG QTC Calculation 457 ms    EKG Calculated P Axis 56 degrees    EKG Calculated R Axis 12 degrees    EKG Calculated T Axis 96 degrees    QTC Fredericia 408 ms   Comprehensive Metabolic Panel    Collection Time: 03/06/19  5:38 AM   Result Value Ref Range    Sodium 134 (L) 135 - 145 mmol/L    Potassium 4.1 3.5 - 5.0 mmol/L    Chloride 103 98 - 107 mmol/L    Anion Gap 9 7 - 15 mmol/L    CO2 22.0 22.0 - 30.0 mmol/L    BUN 18 7 - 21 mg/dL    Creatinine 1.61 (H) 0.60 - 1.00 mg/dL    BUN/Creatinine Ratio 12     EGFR CKD-EPI Non-African American, Female 37 (L) >=60 mL/min/1.34m2    EGFR CKD-EPI African American, Female 43 (L) >=60 mL/min/1.70m2    Glucose 206 (H) 70 - 179  mg/dL    Calcium 8.2 (L) 8.5 - 10.2 mg/dL    Albumin 3.7 3.5 - 5.0 g/dL    Total Protein 6.8 6.5 - 8.3 g/dL    Total Bilirubin 0.5 0.0 - 1.2 mg/dL    AST 21 14 - 38 U/L    ALT 9 <35 U/L    Alkaline Phosphatase 81 38 - 126 U/L   Magnesium Level    Collection Time: 03/06/19  5:38 AM   Result Value Ref Range    Magnesium 1.7 1.6 - 2.2 mg/dL   CBC w/ Differential    Collection Time: 03/06/19  5:38 AM   Result Value Ref Range    WBC 9.3 4.5 - 11.0 10*9/L    RBC 3.28 (L) 4.00 - 5.20 10*12/L    HGB 10.0 (L) 12.0 - 16.0 g/dL    HCT 16.1 (L) 09.6 - 46.0 %    MCV 98.5 80.0 - 100.0 fL    MCH 30.5 26.0 - 34.0 pg    MCHC 31.0 31.0 - 37.0 g/dL    RDW 04.5 (H) 40.9 - 15.0 %    MPV 7.8 7.0 - 10.0 fL    Platelet 454 (H) 150 - 440 10*9/L    Neutrophils % 78.5 %    Lymphocytes % 7.7 %    Monocytes % 11.6 % Eosinophils % 0.1 %    Basophils % 0.1 %    Absolute Neutrophils 7.3 2.0 - 7.5 10*9/L    Absolute Lymphocytes 0.7 (L) 1.5 - 5.0 10*9/L    Absolute Monocytes 1.1 (H) 0.2 - 0.8 10*9/L    Absolute Eosinophils 0.0 0.0 - 0.4 10*9/L    Absolute Basophils 0.0 0.0 - 0.1 10*9/L    Large Unstained Cells 2 0 - 4 %    Macrocytosis Marked (A) Not Present    Anisocytosis Moderate (A) Not Present    Hypochromasia Moderate (A) Not Present   Morphology Review    Collection Time: 03/06/19  5:38 AM   Result Value Ref Range    Smear Review Comments See Comment (A) Undefined    Burr Cells Present (A) Not Present    Hypersegmented Neutrophils Present (A) Not Present   POCT Glucose    Collection Time: 03/06/19  7:14 AM   Result Value Ref Range    Glucose, POC 194 (H) 70 - 179 mg/dL   POCT Glucose    Collection Time: 03/06/19  9:05 AM   Result Value Ref Range    Glucose, POC 206 (H) 70 - 179 mg/dL   Urinalysis    Collection Time: 03/06/19 10:25 AM   Result Value Ref Range    Color, UA Light Yellow     Clarity, UA Hazy     Specific Gravity, UA 1.014 1.003 - 1.030    pH, UA 6.0 5.0 - 9.0    Leukocyte Esterase, UA Negative Negative    Nitrite, UA Negative Negative    Protein, UA >/=500 mg/dL (A) Negative    Glucose, UA 150 mg/dL (A) Negative    Ketones, UA Negative Negative    Urobilinogen, UA 0.2 mg/dL 0.2 mg/dL, 1.0 mg/dL    Bilirubin, UA Negative Negative    Blood, UA Small (A) Negative    RBC, UA 3 <=4 /HPF    WBC, UA 3 0 - 5 /HPF    Squam Epithel, UA 1 0 - 5 /HPF    Bacteria, UA Rare (A) None Seen /HPF    Hyaline Casts, UA 3 (H) 0 -  1 /LPF    Mucus, UA Rare (A) None Seen /HPF   POCT Glucose    Collection Time: 03/06/19 11:34 AM   Result Value Ref Range    Glucose, POC 199 (H) 70 - 179 mg/dL   ECG 12 Lead    Collection Time: 03/06/19  1:32 PM   Result Value Ref Range    EKG Systolic BP  mmHg    EKG Diastolic BP  mmHg    EKG Ventricular Rate 91 BPM    EKG Atrial Rate 91 BPM    EKG P-R Interval 184 ms    EKG QRS Duration 80 ms EKG Q-T Interval 384 ms    EKG QTC Calculation 472 ms    EKG Calculated P Axis 46 degrees    EKG Calculated R Axis 3 degrees    EKG Calculated T Axis 102 degrees    QTC Fredericia 441 ms   POCT Glucose    Collection Time: 03/06/19  1:56 PM   Result Value Ref Range    Glucose, POC 158 70 - 179 mg/dL   POCT Glucose    Collection Time: 03/06/19  2:22 PM   Result Value Ref Range    Glucose, POC 154 70 - 179 mg/dL   Basic Metabolic Panel    Collection Time: 03/06/19  2:25 PM   Result Value Ref Range    Sodium 133 (L) 135 - 145 mmol/L    Potassium 3.7 3.5 - 5.0 mmol/L    Chloride 105 98 - 107 mmol/L    CO2 21.0 (L) 22.0 - 30.0 mmol/L    Anion Gap 7 7 - 15 mmol/L    BUN 20 7 - 21 mg/dL    Creatinine 2.13 (H) 0.60 - 1.00 mg/dL    BUN/Creatinine Ratio 12     EGFR CKD-EPI Non-African American, Female 34 (L) >=60 mL/min/1.69m2    EGFR CKD-EPI African American, Female 39 (L) >=60 mL/min/1.63m2    Glucose 161 70 - 179 mg/dL    Calcium 7.9 (L) 8.5 - 10.2 mg/dL   Blood Gas, Venous    Collection Time: 03/06/19  2:25 PM   Result Value Ref Range    Specimen Source Venous     FIO2 Venous Not Specified     pH, Venous 7.41 7.32 - 7.43    pCO2, Ven 35 (L) 40 - 60 mm Hg    pO2, Ven 50 30 - 55 mm Hg    HCO3, Ven 22 22 - 27 mmol/L    Base Excess, Ven -2.4 (L) -2.0 - 2.0    O2 Saturation, Venous 84.2 40.0 - 85.0 %   Lactate, Venous, Whole Blood    Collection Time: 03/06/19  2:25 PM   Result Value Ref Range    Lactate, Venous 1.3 0.5 - 1.8 mmol/L   CBC w/ Differential    Collection Time: 03/06/19  2:25 PM   Result Value Ref Range    WBC 7.8 4.5 - 11.0 10*9/L    RBC 3.02 (L) 4.00 - 5.20 10*12/L    HGB 9.4 (L) 12.0 - 16.0 g/dL    HCT 08.6 (L) 57.8 - 46.0 %    MCV 96.2 80.0 - 100.0 fL    MCH 31.2 26.0 - 34.0 pg    MCHC 32.5 31.0 - 37.0 g/dL    RDW 46.9 (H) 62.9 - 15.0 %    MPV 9.0 7.0 - 10.0 fL    Platelet 390 150 - 440 10*9/L    Neutrophils %  78.8 %    Lymphocytes % 8.1 %    Monocytes % 10.0 %    Eosinophils % 0.3 %    Basophils % 0.2 % Absolute Neutrophils 6.2 2.0 - 7.5 10*9/L    Absolute Lymphocytes 0.6 (L) 1.5 - 5.0 10*9/L    Absolute Monocytes 0.8 0.2 - 0.8 10*9/L    Absolute Eosinophils 0.0 0.0 - 0.4 10*9/L    Absolute Basophils 0.0 0.0 - 0.1 10*9/L    Large Unstained Cells 3 0 - 4 %    Macrocytosis Moderate (A) Not Present    Anisocytosis Moderate (A) Not Present    Hypochromasia Slight (A) Not Present   ECG 12 Lead    Collection Time: 03/06/19  2:34 PM   Result Value Ref Range    EKG Systolic BP  mmHg    EKG Diastolic BP  mmHg    EKG Ventricular Rate 84 BPM    EKG Atrial Rate 84 BPM    EKG P-R Interval 168 ms    EKG QRS Duration 84 ms    EKG Q-T Interval 400 ms    EKG QTC Calculation 472 ms    EKG Calculated P Axis 43 degrees    EKG Calculated R Axis 8 degrees    EKG Calculated T Axis 87 degrees    QTC Fredericia 447 ms   Blood Gas, Arterial Specify Site: Arterial; FIO2 Arterial: Not Specified    Collection Time: 03/06/19  2:37 PM   Result Value Ref Range    Specimen Source Arterial     FIO2 Arterial Not Specified     pH, Arterial 7.46 (H) 7.35 - 7.45    pO2, Arterial 105.0 80.0 - 110.0 mm Hg    pCO2, Arterial 30.7 (L) 35.0 - 45.0 mm Hg    HCO3 (Bicarbonate), Arterial 22 22 - 27 mmol/L    Base Excess, Arterial -1.9 -2.0 - 2.0    O2 Sat, Arterial 99.0 94.0 - 100.0 %   POCT Glucose    Collection Time: 03/06/19  4:33 PM   Result Value Ref Range    Glucose, POC 118 70 - 179 mg/dL       STAT Non-contrasted CT scan of the head, personally reviewed:  No acute appearing intracranial abnormality.

## 2019-03-07 LAB — COMPREHENSIVE METABOLIC PANEL
ALBUMIN: 3.3 g/dL — ABNORMAL LOW (ref 3.5–5.0)
ALKALINE PHOSPHATASE: 74 U/L (ref 38–126)
ALT (SGPT): 11 U/L (ref <35)
ANION GAP: 9 mmol/L (ref 7–15)
AST (SGOT): 24 U/L (ref 14–38)
BILIRUBIN TOTAL: 0.6 mg/dL (ref 0.0–1.2)
BLOOD UREA NITROGEN: 19 mg/dL (ref 7–21)
BUN / CREAT RATIO: 13
CALCIUM: 8.1 mg/dL — ABNORMAL LOW (ref 8.5–10.2)
CHLORIDE: 102 mmol/L (ref 98–107)
CO2: 23 mmol/L (ref 22.0–30.0)
CREATININE: 1.47 mg/dL — ABNORMAL HIGH (ref 0.60–1.00)
EGFR CKD-EPI AA FEMALE: 44 mL/min/{1.73_m2} — ABNORMAL LOW (ref >=60)
GLUCOSE RANDOM: 178 mg/dL (ref 70–179)
POTASSIUM: 3.6 mmol/L (ref 3.5–5.0)
PROTEIN TOTAL: 6.5 g/dL (ref 6.5–8.3)
SODIUM: 134 mmol/L — ABNORMAL LOW (ref 135–145)

## 2019-03-07 LAB — CBC W/ AUTO DIFF
BASOPHILS ABSOLUTE COUNT: 0 10*9/L (ref 0.0–0.1)
BASOPHILS RELATIVE PERCENT: 0.2 %
EOSINOPHILS RELATIVE PERCENT: 0.3 %
HEMATOCRIT: 29.3 % — ABNORMAL LOW (ref 36.0–46.0)
HEMOGLOBIN: 9.6 g/dL — ABNORMAL LOW (ref 12.0–16.0)
LARGE UNSTAINED CELLS: 2 % (ref 0–4)
LYMPHOCYTES ABSOLUTE COUNT: 0.6 10*9/L — ABNORMAL LOW (ref 1.5–5.0)
MEAN CORPUSCULAR HEMOGLOBIN CONC: 32.8 g/dL (ref 31.0–37.0)
MEAN CORPUSCULAR HEMOGLOBIN: 31.5 pg (ref 26.0–34.0)
MEAN CORPUSCULAR VOLUME: 96.1 fL (ref 80.0–100.0)
MEAN PLATELET VOLUME: 8.8 fL (ref 7.0–10.0)
MONOCYTES ABSOLUTE COUNT: 1 10*9/L — ABNORMAL HIGH (ref 0.2–0.8)
NEUTROPHILS ABSOLUTE COUNT: 6.7 10*9/L (ref 2.0–7.5)
NEUTROPHILS RELATIVE PERCENT: 78.9 %
PLATELET COUNT: 392 10*9/L (ref 150–440)
RED BLOOD CELL COUNT: 3.05 10*12/L — ABNORMAL LOW (ref 4.00–5.20)
RED CELL DISTRIBUTION WIDTH: 17.9 % — ABNORMAL HIGH (ref 12.0–15.0)
WBC ADJUSTED: 8.5 10*9/L (ref 4.5–11.0)

## 2019-03-07 LAB — MEAN CORPUSCULAR VOLUME: Erythrocyte mean corpuscular volume:EntVol:Pt:RBC:Qn:Automated count: 96.1

## 2019-03-07 LAB — MAGNESIUM: Magnesium:MCnc:Pt:Ser/Plas:Qn:: 1.8

## 2019-03-07 LAB — EGFR CKD-EPI AA FEMALE: Lab: 44 — ABNORMAL LOW

## 2019-03-07 MED ADMIN — insulin lispro (HumaLOG) injection 1-20 Units: 1-20 [IU] | SUBCUTANEOUS | @ 02:00:00 | Stop: 2019-03-11

## 2019-03-07 MED ADMIN — senna (SENOKOT) tablet 2 tablet: 2 | ORAL | @ 02:00:00 | Stop: 2019-03-11

## 2019-03-07 MED ADMIN — naloxone (NARCAN) 0.4 mg/mL injection: INTRAVENOUS | @ 17:00:00 | Stop: 2019-03-07

## 2019-03-07 MED ADMIN — insulin lispro (HumaLOG) injection 1-20 Units: 1-20 [IU] | SUBCUTANEOUS | @ 23:00:00 | Stop: 2019-03-11

## 2019-03-07 MED ADMIN — insulin NPH (HumuLIN,NovoLIN) injection 8 Units: 8 [IU] | SUBCUTANEOUS | @ 02:00:00 | Stop: 2019-03-07

## 2019-03-07 MED ADMIN — naloxone (NARCAN) injection 0.4 mg: 0.4 mg | INTRAVENOUS | @ 17:00:00 | Stop: 2019-03-07

## 2019-03-07 MED ADMIN — cloNIDine HCL (CATAPRES) tablet 0.3 mg: .3 mg | ORAL | @ 14:00:00 | Stop: 2019-03-11

## 2019-03-07 MED ADMIN — insulin lispro (HumaLOG) injection 1-20 Units: 1-20 [IU] | SUBCUTANEOUS | @ 14:00:00 | Stop: 2019-03-11

## 2019-03-07 MED ADMIN — atorvastatin (LIPITOR) tablet 40 mg: 40 mg | ORAL | @ 02:00:00 | Stop: 2019-03-11

## 2019-03-07 MED ADMIN — cholecalciferol (vitamin D3) tablet 2,000 Units: 2000 [IU] | ORAL | @ 14:00:00 | Stop: 2019-03-11

## 2019-03-07 MED ADMIN — melatonin tablet 3 mg: 3 mg | ORAL | @ 04:00:00 | Stop: 2019-03-07

## 2019-03-07 MED ADMIN — enoxaparin (LOVENOX) syringe 40 mg: 40 mg | SUBCUTANEOUS | @ 02:00:00 | Stop: 2019-03-11

## 2019-03-07 MED ADMIN — cloNIDine HCL (CATAPRES) tablet 0.3 mg: .3 mg | ORAL | @ 02:00:00 | Stop: 2019-03-11

## 2019-03-07 MED ADMIN — senna (SENOKOT) tablet 2 tablet: 2 | ORAL | @ 14:00:00 | Stop: 2019-03-11

## 2019-03-07 MED ADMIN — lisinopriL (PRINIVIL,ZESTRIL) tablet 20 mg: 20 mg | ORAL | @ 14:00:00 | Stop: 2019-03-11

## 2019-03-07 MED ADMIN — SMOG ENEMA: 240 mL | RECTAL | @ 21:00:00 | Stop: 2019-03-07

## 2019-03-07 MED ADMIN — melatonin tablet 3 mg: 3 mg | ORAL | @ 23:00:00 | Stop: 2019-03-11

## 2019-03-07 MED ADMIN — amLODIPine (NORVASC) tablet 10 mg: 10 mg | ORAL | @ 14:00:00 | Stop: 2019-03-11

## 2019-03-07 MED ADMIN — polyethylene glycol (MIRALAX) packet 17 g: 17 g | ORAL | @ 02:00:00 | Stop: 2019-03-11

## 2019-03-07 NOTE — Unmapped (Signed)
Pt drowsy and lethargic at beginning of shift, but responsive and appropriate. Oriented x4. Pt has been afebrile, bp elevated at beginning of shift. Pt with no c/o pain and nausea when trying to eat breakfast. Pt had unresponsive episode where she was unable to be aroused. RR called d/t change in mental status, pt taken to STAT head CT. One dose IV narcan administered and pt became more alert. Pt denies taking any additional meds than those given her. Pt with active bowel sounds and sufficient urine output, no stools passed on shift. No new skin breakdown this shift. No s/s infection this shift. Fall precautions and pt safety maintained. Pt continues on tele and started on cont. Pulse ox for closer monitoring. Will continue to monitor.     Problem: Adult Inpatient Plan of Care  Goal: Plan of Care Review  Outcome: Ongoing - Unchanged  Goal: Patient-Specific Goal (Individualization)  Outcome: Ongoing - Unchanged  Goal: Absence of Hospital-Acquired Illness or Injury  Outcome: Ongoing - Unchanged  Goal: Optimal Comfort and Wellbeing  Outcome: Ongoing - Unchanged  Goal: Readiness for Transition of Care  Outcome: Ongoing - Unchanged  Goal: Rounds/Family Conference  Outcome: Ongoing - Unchanged

## 2019-03-07 NOTE — Unmapped (Signed)
OCCUPATIONAL THERAPY  Evaluation (03/07/19 1007)    Patient Name:  Alexandra Richards [facility]       Medical Record Number: [id number]   Date of Birth: 05-01-1960  Sex: Female          OT Treatment Diagnosis:  Weakness, decreased activity tolerance, loss of ADL independence      Problem List: Decreased range of motion, Decreased endurance, Impaired balance, Decreased mobility, Decreased coordination, Decreased safety awareness, Pain, Fall Risk, Impaired ADLs, Decreased strength    Assessment: Alexandra Richards is a 58 y.o. female with HTN, T2DM, CKD, HFpEF, metastatic breast cancer who presented to [facility] for nausea, diarrhea, and emesis x3 days in the setting of recent chemotherapy/immunotherapy and abx use; rapid response called 12/9 due to somnolence.     She presents to acute OT with fatigue, weakness, and decreased activity tolerance limiting her ability to participate in ADL and functional mobility. She currently requires assistance for all transfers and ADL. She will benefit from skilled acute OT and post acute OT 5x/wk to address deficits.     After review of contributing co-mobidities and personal factors, clinical presentation and exam findings, patient demonstrates moderate complexity for evaluation and development of plan of care.    Today's Interventions: Repositioning at bed-level to promote more upright position. Pt education re: role of acute OT, POC, benefits and importance of EOB/OOB activity    Activity Tolerance During Today's Session  Patient limited by fatigue    Plan  Planned Frequency of Treatment:  1-2x per day for: 3-4x week Planned Interventions:  Adaptive equipment, ADL retraining, Balance activities, Bed mobility, Compensatory tech. training, Conservation, Functional mobility, Functional cognition, Endurance activities, Education - Family / caregiver, Education - Patient, Home exercise program, Safety education, Positioning, Range of motion, Transfer training, Therapeutic exercise    Post-Discharge Occupational Therapy Recommendations:  OT Post Acute Discharge Recommendations: 5x weekly, Low intensity   OT DME Recommendations: Defer to post acute    GOALS:   Patient and Family Goals: To go home    Long Term Goal #1: Pt will score 20/24 on AMPAC in 2 months       Short Term:  Pt will complete OOB funcitonal transfer assessment   Time Frame : 1 week  Pt will complete lower body dressing with min A +AE as needed   Time Frame : 1 week  Pt will complete grooming with setup A   Time Frame : 1 week       Prognosis:  Fair  Positive Indicators:  PLOF  Barriers to Discharge: Decreased caregiver support, Inability to safely perform ADLS, Functional strength deficits, Gait instability, Impaired Balance, Inaccessible home environment    Subjective  Current Status Pt left semi reclined in bed with bed alarm activated, call bell in reach, all immediate needs met  Prior Functional Status Initially, pt reported independence in all ADLs PTA. However, with further questionning, pt reported receiving assistance from her sister for functional mobility, LB dressing, toilet hygiene, and community mobility. Pt reported her sister does not live nearby, but visits frequently. Pt reported utilizing a rolling walker for functional mobility. Pt reported a fall recently when tripping over a cord. Has hired home health services that are supposed to begin on Friday    Medical Tests / Procedures: Reviewed  Services patient receives: (Pt reported she is going to start receiving home health aide services) Patient / Caregiver reports: I am just so tired, I just got to the bedside toilet and that  wore me out    Past Medical History:   Diagnosis Date   ??? Cancer (CMS-HCC)    ??? Diabetes mellitus (CMS-HCC)    ??? HLD (hyperlipidemia)    ??? Hypertension    ??? Non-intractable vomiting with nausea 01/04/2019   ??? Renal disorder     Social History     Tobacco Use   ??? Smoking status: Former Smoker     Types: Cigarettes   ??? Smokeless tobacco: Never Used   Substance Use Topics   ??? Alcohol use: No     Alcohol/week: 0.0 standard drinks      No past surgical history on file. History reviewed. No pertinent family history.     Patient has no known allergies.     Objective Findings  Precautions / Restrictions  Falls precautions    Weight Bearing  Non-applicable    Required Braces or Orthoses  Non-applicable    Communication Preference  Verbal    Pain  Pt denies pain but endorses fatigue    Equipment / Environment  Vascular access (PIV, TLC, Port-a-cath, PICC)    Living Situation  Living Environment: House  Lives With: Alone(Daughers have been helping as able)  Home Living: One level home, Stairs to enter with rails, Walk-in shower, Standard height toilet, Built-in shower seat  Rail placement (outside): Bilateral rails  Number of Stairs: 3     Cognition   Orientation Level:  Disoriented to time   Arousal/Alertness:  Delayed responses to stimuli   Attention Span:  Appears intact   Memory:      Following Commands:  Follows multistep commands with increased time, Follows multistep commands with repetition   Safety Judgment:      Awareness of Errors:      Problem Solving:      Comments:      Vision / Perception        Vision: Wears glasses for reading only     Comments: Reports she is able to read the menu if she holds it close, difficulty reading the board    Hand Function  Hand Dominance: LHD  Good B grip    Skin Inspection  visible skin intact    ROM / Strength/Coordination  UE ROM/ Strength/ Coordination: UE ROM and strength WFL Sensation:  Denies paresthesias to hands/feet    Balance:  Sitting: SBA; Standing: NT    Functional Mobility  Transfer Assistance Needed: Yes  Transfers - Needs Assistance: (Pt had just returned to EOB from completing BSC t/f with NA upon therapist arrival, not agreeable to additional stand 2/2 to fatigue)  Bed Mobility Assistance Needed: Yes  Bed Mobility - Needs Assistance: (Min A sit>sup, Max A +2 for repositoning higher in bed)      ADLs  ADLs: Needs assistance with ADLs  ADLs - Needs Assistance: Toileting, LB dressing, UB dressing, Bathing, Grooming  Grooming - Needs Assistance: (Setup A, bed-level)  Bathing - Needs Assistance: Mod assist  Toileting - Needs Assistance: Max assist  UB Dressing - Needs Assistance: Min assist  LB Dressing - Needs Assistance: Max assist      Vitals / Orthostatics  With Activity: NAD      Medical Staff Made Aware: RN aware      Occupational Therapy Session Duration  OT Individual - Duration: 20         I attest that I have reviewed the above information.  Signed: Jaclyn Shaggy, OT  Filed 03/07/2019

## 2019-03-07 NOTE — Unmapped (Signed)
Spiritual Care Consult Note    Per Spiritual Care Consult Request, Alexandra Richards attempted to visit this patient and introduce relationship of care and support. Patient issleeping at time of visit attempt. No assessment made at this time.  This chaplain will continue to attempt to introduce relationship of care and support.    Signed: Arnell Asal  CPE Oncology Chaplain Resident  Pager: [phone]  [email or web address]    March 07, 2019 3:25 PM

## 2019-03-07 NOTE — Unmapped (Signed)
Oncology (MDE2) Progress Note  Subjective & 24 Hour Events:   Patient with rapid yesterday afternoon for altered mental status.  She was nonresponsive to sternal rub, glucose normal, blood pressure normal, O2 sat normal. She underwent stat head CT which showed no acute process.  Recovered after administration of Narcan, however U tox negative for opioids.  Similar episode occurred early this afternoon with his administration of Narcan having no effect.  Glucose elevated, blood pressure normal.  Vitals stable, elected to closely monitor for a few hours, rather than pursue further work-up even recent normal workup.  Mental status improving and patient is more responsive this afternoon.    This morning the patient confided that she is very tired of fighting this cancer.  She states that she is looking towards the future and looking towards going to heaven.  She states her faith is very important to her.  Chaplain was called.  She states that she is afraid of her family being upset that she feels she cannot fight anymore. She is overwhelmed by the number of health problems she has including: uncontrolled DM, CKD, and now metastatic breast cancer.    Assessment & Plan:   Alexandra Richards is a 58 y.o. female with HTN, T2DM, CKD, HFpEF, metastatic breast cancer who presented to [facility] for nausea, diarrhea, and emesis x3 days in the setting of recent chemotherapy/immunotherapy and abx use.     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 Received palliative radiation 9/27-10/7 for a total of 3000 cGy. Treated with capecitabine (Xeloda) 1500 mg bid for 14 days then 7 off, initiated 01/12/19.  Reports compliance with schedule.  Was unfortunately held due to creatinine clearance less than 30.  After repeat lab work recently, creatinine clearance was appropriate and Xeloda was restarted a few days prior to current presentation. Received denosumab on 12/3 as well which has high frequency of nausea, diarrhea. CT chest with pleural effusion, likely malignant (stable on RA). Patient discussed on rounds this morning that she doesn't think she wants to continue chemotherapy treatment.  -Hold chemo while inpatient  -Consider IP consult for consideration of drainage of likely malignant L effusion.   ??  #Diarrhea - Nausea, Emesis, reduced PO intake - Constipatio: differential diagnoses for the etiology include chemotherapy side effect, Xgeva side effect, viral gastroenteritis, constipation w/encopresis, colitis, other infectious diarrhea. Previously admitted for similar symptoms found to have colitis and stool burden. No rebounding or guarding, but TTP to deep palpation on L abdomen. Furthermore, endorses recent antibiotic use, thus infectious etiologies must be ruled out. Long history of constipation although reports relatively normal BMs prior to current illness. Recent Xgeva administration which could also be contributing given SE profile of nausea/vomiting. S/p zofran, phenergan, reglan at [facility] ED. COVID negative. KUB and CT AP showing significant stool burden, making stool overflow due to constipation more likely, especially with patient's baseline constipation and home opioid use.  -BCx2-- NGx24 hours   -  Miralax, senna BID  - SMOG enema x1 #HTN: goal per nephrology BP 130-140/~80 to avoid hypotension, unable to tolerate PO meds currently. Last took meds day before admission. Reports compliance. Significantly hypertensive on admission with SBPs 190-200s. May have some component of rebound HTN from inability to take clonidine. BPs more stable since admission and tolerating PO meds.  - Continue home: clonidine, imdur, lisinopril, carvedilol   - Continue holding home: hydralazine, lasix  ??  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  ??  #DM2: on 15 units of 70/30 bid at home. Hyperglycemic on admission.   -Last HbA1c: 11.6 on 9/19.  -Increase NPH to 12u BID, will continue to increase as PO increases.  -SSI  ??  #HFpEF: Last echo 5/20: EF 55% with G2DD and no valvular dz. Reportedly takes Lasix 40mg  PO daily at home. Appears euvolemic on exam. Unable to tolerate PO meds currently.   -Holding diuresis for now, will restart when at full BP meds  -Atorvastatin as tolerated.   -Aggressive HTN control as above.     Daily Checklist:  Diet: Regular Diet  DVT PPx: Lovenox 40mg  q24h  Electrolytes: Replete Potassium to >/=4 and Magnesium to >/=2  Code Status: Full Code  Dispo: Admit to floor- E2    Objective:   Temp:  [36.5 ??C-37.3 ??C] 36.9 ??C  Heart Rate:  [71-113] 91  Resp:  [16-20] 18  BP: (125-188)/(61-96) 188/96  SpO2:  [96 %-100 %] 99 %    Gen: Sad appearing woman lying in bed, NAD, alert, oriented, answers questions appropriately  HEENT: atraumatic, sclera anicteric, MMM. OP w/o erythema or exudate   Heart: RRR, S1, S2, no M/R/G, no chest wall tenderness  Lungs: Comfortable and conversant on RA. No use of accessory muscles  GI: Normoactive bowel sounds, soft, NTND, no rebound/guarding  Extremities: no clubbing, cyanosis, or edema  Psych: Appropriate mood and affect    Labs/Studies: Labs and Studies from the last 24hrs per EMR and Reviewed --------------  Kelvin Cellar, MD, MPH  Med Peds PGY-1

## 2019-03-07 NOTE — Unmapped (Signed)
Pt alert and oriented, intermittently drowsy.  Pt hypertensive, otherwise VSS.  Pt remains free from falls and afebrile.  Pt denies pain.  Pt remains on room air with good results.  Pt expressed this morning that she is tired of fighting and is ready to stop so she can have some time with her family and to do things she wants to do.  Spiritual consult placed.  Safety measures remain in place with bed alarm on.  Will continue to monitor.     Vitals:    03/06/19 2155 03/06/19 2335 03/07/19 0216 03/07/19 0327   BP: 177/89 146/72  169/83   Pulse: 96 84 76 84   Resp: 18 20  18    Temp: 37.3 ??C (99.1 ??F) 36.8 ??C (98.2 ??F)  36.8 ??C (98.2 ??F)   TempSrc: Oral Oral  Oral   SpO2: 99% 98% 100% 99%   Weight:       Height:              Problem: Adult Inpatient Plan of Care  Goal: Plan of Care Review  Outcome: Progressing  Flowsheets(Taken 03/07/2019 0802)  Progress: no change  Plan of Care Reviewed With: patient  Goal: Patient-Specific Goal (Individualization)  Outcome: Progressing  Flowsheets (Taken 03/07/2019 0802)  Patient-Specific Goals (Include Timeframe): Patient would like to get her blood pressure back under control without nausea and vomiting.  Individualized Care Needs: Cluster care, empathetic listening, presence, close monitoring.  Anxieties, Fears or Concerns: Patient is concerned about getting her BP, Nausea and Vomiting under control.  Goal: Absence of Hospital-Acquired Illness or Injury  Outcome: Progressing  Intervention: Identify and Manage Fall Risk  Flowsheets (Taken 03/07/2019 0802)  Safety Interventions:   aspiration precautions   assistive device   bed alarm   commode/urinal/bedpan at bedside   environmental modification   fall reduction program maintained   isolation precautions   lighting adjusted for tasks/safety   low bed   mobility aid   nonskid shoes/slippers when out of bed   room near unit station   supervised activity   toileting scheduled  Intervention: Prevent Skin Injury Flowsheets (Taken 03/07/2019 0802)  Pressure Reduction Techniques: frequent weight shift encouraged  Intervention: Prevent VTE (venous thromboembolism)  Flowsheets (Taken 03/07/2019 0802)  VTE Prevention/Management: ambulation promoted  Intervention: Prevent Infection  Flowsheets (Taken 03/07/2019 0802)  Infection Prevention:   cohorting utilized   environmental surveillance performed   equipment surfaces disinfected   handwashing promoted   personal protective equipment utilized   rest/sleep promoted   single patient room provided   visitors restricted/screened  Goal: Optimal Comfort and Wellbeing  Outcome: Progressing  Intervention: Monitor Pain and Promote Comfort  Flowsheets (Taken 03/07/2019 0802)  Pain Management Interventions:   care clustered   pain management plan reviewed with patient/caregiver   quiet environment facilitated  Intervention: Provide Person-Centered Care  Flowsheets (Taken 03/07/2019 0802)  Trust Relationship/Rapport:   care explained   choices provided   emotional support provided   empathic listening provided   questions answered   questions encouraged   reassurance provided   thoughts/feelings acknowledged  Goal: Readiness for Transition of Care  Outcome: Progressing  Goal: Rounds/Family Conference  Outcome: Progressing

## 2019-03-07 NOTE — Unmapped (Signed)
PHYSICAL THERAPY  Evaluation(With Jaclyn Shaggy, OT due to decreased activity tolerance) (03/07/19 1005)     Patient Name:  Alexandra Richards [facility]       Medical Record Number: [id number]   Date of Birth: 20-Jun-1960  Sex: Female            Treatment Diagnosis: decreased activity tolerance, BLE weakness    ASSESSMENT  Problem List: Decreased strength, Impaired sensation, Impaired balance, Decreased mobility, Fall Risk, Decreased endurance, Impaired ADLs     Assessment : Keelin Neville is a 58 y.o. female with HTN, T2DM, CKD, HFpEF, metastatic breast cancer who presented to [facility] for nausea, diarrhea, and emesis x3 days in the setting of recent chemotherapy/immunotherapy and abx use; rapid response called 12/9 due to somnolence. She presents to acute PT services with BLE weakness and significantly impaired activity tolerance and declined edge of bed mobility due to just returning from bedside commode with staff. She will benefit from ongoing acute and 5x(low) post acute PT services at this time to address deficits. After a review of the personal factors, comorbidities, clinical presentation, and examination of the number of affected body systems, the patient presents as a moderate complexity case.     Today's Interventions: PT Eval, pt education re: PT role, POC, staff assist for mobility, importance of increasing activity as able        PLAN  Planned Frequency of Treatment:  1-2x per day for: 3-4x week      Planned Interventions: Balance activities, Education - Patient, Education - Family / caregiver, Endurance activities, Functional mobility, Home exercise program, Gait training, Self-care / Home training, Stair training, Therapeutic exercise, Therapeutic activity, Transfer training    Post-Discharge Physical Therapy Recommendations:  5x weekly, Low intensity    PT DME Recommendations: Defer to post acute           Goals:   Patient and Family Goals: to improve energy Long Term Goal #1: TBD pending OOB mobility assessment       SHORT GOAL #1: Pt will participate in EOB mobility assessment              Time Frame : 1 week  SHORT GOAL #2: Pt will participate in OOB mobility assessment              Time Frame : 1 week                      Prognosis:  Fair  Positive Indicators: Age  Barriers to Discharge: Decreased caregiver support, Inability to safely perform ADLS, Functional strength deficits, Gait instability, Impaired Balance, Inaccessible home environment    SUBJECTIVE  Patient reports: Pt reported significant fatigue, just returned from [facility] with NA  Current Functional Status: Session ended with pt semi-reclined in bed, needs in reach, RN aware, bed alarm on  Services patient receives: (Pt reported she is going to start receiving home health aide services)  Prior Functional Status: Pt reported ambulating short distances at home with a RW and usually has help when she is walking. She has had 1 fall when she tripped over a cord. Her Daughter has been assisting with all ADLs  Equipment available at home: Levan Hurst, Bedside commode     Past Medical History:   Diagnosis Date   ??? Cancer (CMS-HCC)    ??? Diabetes mellitus (CMS-HCC)    ??? HLD (hyperlipidemia)    ??? Hypertension    ??? Non-intractable vomiting with nausea 01/04/2019   ???  Renal disorder     Social History     Tobacco Use   ??? Smoking status: Former Smoker     Types: Cigarettes   ??? Smokeless tobacco: Never Used   Substance Use Topics   ??? Alcohol use: No     Alcohol/week: 0.0 standard drinks      No past surgical history on file. History reviewed. No pertinent family history.     Allergies: Patient has no known allergies.                Objective Findings  Precautions / Restrictions  Precautions: Falls precautions  Weight Bearing Status: Non-applicable  Required Braces or Orthoses: Non-applicable    Communication Preference: Verbal   Pain Comments: denied Medical Tests / Procedures: Abominal CT 12/9: Unchanged scattered lytic lesions in the visualized spine, pelvis, bilateral femurs.  Equipment / Environment: Vascular access (PIV, TLC, Port-a-cath, PICC), Patient not wearing mask for full session, Bedside commode    At Rest: VSS per epic             Living Situation  Living Environment: House  Lives With: Alone(Daughter has been helping during the day)  Home Living: One level home, Stairs to enter with rails, Walk-in shower, Standard height toilet, Built-in shower seat  Rail placement (outside): Bilateral rails  Number of Stairs: 3     Cognition: Drowsy, oriented x4  Visual / Perception Status: intermittent blurry vision per pt       UE ROM: WFL  UE Strength: WFL  LE ROM: WFL  LE Strength: Grossly 3+/5                       Sensation: pt reported numbness in bilateral feet, otherwise intact  Balance: not assessed         Bed Mobility: total A x2 for supine boost up in the bed  Transfers: not assessed, pt declined due to fatigue.  observed pt returning back to bed with NA prior to session initiation   Gait  Gait: not assessed  Stairs: not assessed      Endurance: poor    Physical Therapy Session Duration  PT Individual - Duration: 15    Medical Staff Made Aware: RN Rachelle    I attest that I have reviewed the above information.  Signed: Salome Holmes, PT  Filed 03/07/2019

## 2019-03-08 LAB — CBC W/ AUTO DIFF
BASOPHILS ABSOLUTE COUNT: 0 10*9/L (ref 0.0–0.1)
BASOPHILS RELATIVE PERCENT: 0.4 %
EOSINOPHILS ABSOLUTE COUNT: 0.1 10*9/L (ref 0.0–0.4)
EOSINOPHILS RELATIVE PERCENT: 0.7 %
HEMATOCRIT: 30.5 % — ABNORMAL LOW (ref 36.0–46.0)
HEMOGLOBIN: 9.9 g/dL — ABNORMAL LOW (ref 12.0–16.0)
LARGE UNSTAINED CELLS: 3 % (ref 0–4)
LYMPHOCYTES ABSOLUTE COUNT: 0.6 10*9/L — ABNORMAL LOW (ref 1.5–5.0)
LYMPHOCYTES RELATIVE PERCENT: 7.9 %
MEAN CORPUSCULAR HEMOGLOBIN CONC: 32.6 g/dL (ref 31.0–37.0)
MEAN CORPUSCULAR HEMOGLOBIN: 31.3 pg (ref 26.0–34.0)
MEAN CORPUSCULAR VOLUME: 96 fL (ref 80.0–100.0)
MEAN PLATELET VOLUME: 9 fL (ref 7.0–10.0)
MONOCYTES ABSOLUTE COUNT: 1.1 10*9/L — ABNORMAL HIGH (ref 0.2–0.8)
MONOCYTES RELATIVE PERCENT: 13.3 %
NEUTROPHILS RELATIVE PERCENT: 75.1 %
PLATELET COUNT: 402 10*9/L (ref 150–440)
RED BLOOD CELL COUNT: 3.18 10*12/L — ABNORMAL LOW (ref 4.00–5.20)
WBC ADJUSTED: 8 10*9/L (ref 4.5–11.0)

## 2019-03-08 LAB — COMPREHENSIVE METABOLIC PANEL
ALBUMIN: 3.2 g/dL — ABNORMAL LOW (ref 3.5–5.0)
ALT (SGPT): 11 U/L (ref <35)
AST (SGOT): 21 U/L (ref 14–38)
BILIRUBIN TOTAL: 0.5 mg/dL (ref 0.0–1.2)
BLOOD UREA NITROGEN: 18 mg/dL (ref 7–21)
BUN / CREAT RATIO: 13
CALCIUM: 8 mg/dL — ABNORMAL LOW (ref 8.5–10.2)
CHLORIDE: 101 mmol/L (ref 98–107)
CO2: 23 mmol/L (ref 22.0–30.0)
CREATININE: 1.44 mg/dL — ABNORMAL HIGH (ref 0.60–1.00)
EGFR CKD-EPI AA FEMALE: 46 mL/min/{1.73_m2} — ABNORMAL LOW (ref >=60)
EGFR CKD-EPI NON-AA FEMALE: 39 mL/min/{1.73_m2} — ABNORMAL LOW (ref >=60)
GLUCOSE RANDOM: 162 mg/dL (ref 70–179)
POTASSIUM: 3.7 mmol/L (ref 3.5–5.0)
PROTEIN TOTAL: 6.3 g/dL — ABNORMAL LOW (ref 6.5–8.3)
SODIUM: 132 mmol/L — ABNORMAL LOW (ref 135–145)

## 2019-03-08 LAB — MEAN CORPUSCULAR HEMOGLOBIN: Erythrocyte mean corpuscular hemoglobin:EntMass:Pt:RBC:Qn:Automated count: 31.3

## 2019-03-08 LAB — MAGNESIUM: Magnesium:MCnc:Pt:Ser/Plas:Qn:: 1.7

## 2019-03-08 LAB — PROTEIN TOTAL: Protein:MCnc:Pt:Ser/Plas:Qn:: 6.3 — ABNORMAL LOW

## 2019-03-08 MED ADMIN — gadoterate meglumine (DOTAREM) Soln 15 mL: 15 mL | INTRAVENOUS | @ 04:00:00 | Stop: 2019-03-07

## 2019-03-08 MED ADMIN — polyethylene glycol (MIRALAX) packet 17 g: 17 g | ORAL | @ 14:00:00 | Stop: 2019-03-11

## 2019-03-08 MED ADMIN — isosorbide mononitrate (IMDUR) 24 hr tablet 120 mg: 120 mg | ORAL | @ 14:00:00 | Stop: 2019-03-11

## 2019-03-08 MED ADMIN — insulin NPH (HumuLIN,NovoLIN) injection 12 Units: 12 [IU] | SUBCUTANEOUS | @ 02:00:00 | Stop: 2019-03-08

## 2019-03-08 MED ADMIN — senna (SENOKOT) tablet 2 tablet: 2 | ORAL | @ 14:00:00 | Stop: 2019-03-11

## 2019-03-08 MED ADMIN — insulin lispro (HumaLOG) injection 1-20 Units: 1-20 [IU] | SUBCUTANEOUS | @ 02:00:00 | Stop: 2019-03-11

## 2019-03-08 MED ADMIN — cloNIDine HCL (CATAPRES) tablet 0.3 mg: .3 mg | ORAL | @ 14:00:00 | Stop: 2019-03-11

## 2019-03-08 MED ADMIN — insulin lispro (HumaLOG) injection 1-20 Units: 1-20 [IU] | SUBCUTANEOUS | @ 14:00:00 | Stop: 2019-03-11

## 2019-03-08 MED ADMIN — cholecalciferol (vitamin D3) tablet 2,000 Units: 2000 [IU] | ORAL | @ 14:00:00 | Stop: 2019-03-11

## 2019-03-08 MED ADMIN — carvediloL (COREG) tablet 25 mg: 25 mg | ORAL | @ 02:00:00 | Stop: 2019-03-11

## 2019-03-08 MED ADMIN — amLODIPine (NORVASC) tablet 10 mg: 10 mg | ORAL | @ 14:00:00 | Stop: 2019-03-11

## 2019-03-08 MED ADMIN — lisinopriL (PRINIVIL,ZESTRIL) tablet 20 mg: 20 mg | ORAL | @ 14:00:00 | Stop: 2019-03-11

## 2019-03-08 NOTE — Unmapped (Signed)
Pt alert and oriented x4 for most of shift, pt had episode of drowsiness, reporting similar drowsiness during the day that self resolved after several hours. Pt has been afebrile with stable VS. BP was elevated at beginning of shift but improved over shift with scheduled meds. Pt with no c/o pain or nausea. Pt with active bowel and sufficient urine output. No new skin breakdown this shift. No s/s infection this shift. Fall precautions and pt safety maintained. Will continue to monitor.     Problem: Adult Inpatient Plan of Care  Goal: Plan of Care Review  Outcome: Ongoing - Unchanged  Goal: Patient-Specific Goal (Individualization)  Outcome: Ongoing - Unchanged  Goal: Absence of Hospital-Acquired Illness or Injury  Outcome: Ongoing - Unchanged  Goal: Optimal Comfort and Wellbeing  Outcome: Ongoing - Unchanged  Goal: Readiness for Transition of Care  Outcome: Ongoing - Unchanged  Goal: Rounds/Family Conference  Outcome: Ongoing - Unchanged

## 2019-03-08 NOTE — Unmapped (Signed)
[facility] Consult Services  ADVANCED FOOT AND NAIL CARE CONSULT     Reason For Consult:  - Initial  - Nail Care    Assessment:  Received order for [facility] to complete Advanced Foot and Nail Care Service.  Patient reports that she has not been able to independently cut toenails in awhile.    The patient has never been to a Podiatrist.  The Patient is agreeable to [facility] completing nail care.    Nail Assessment:  -  Finger Nail:  slightly long. Toenails were moderately long.  -  No Ulcerations, foot deformities, or other nail concerns noted.      Procedure:  -  Patient in need of toenail cleaning and cutting.  The patient requested fingernail trimming also.  -  All debris removed from below nail surface with cuticle stick, nails cut with sterile clippers, then nails filed with nail file.  -  Feet cleaned and moisturizer applied.  -  Patient tolerated the procedure well    Instruments Used:  -  cuticle stick, sterile nippers and file 100/180 grit    Plan:  -  Instructed the Patient the risk of ulceration, infection, and trauma with overgrown nails. Reviewed the importance of following up with [facility], Podiatrist or PCP to continue to maintain nail care/length.    Workup Time:  30 minutes

## 2019-03-08 NOTE — Unmapped (Signed)
Care Management  Initial Transition Planning Assessment    CM met with patient in pt room.  Pt/visitors were not wearing hospital provided masks for the duration of the interaction with CM.   CM was wearing hospital provided surgical mask and hospital provided eye protection.  CM was not within 6 foot of the patient/visitors during this interaction.     Alexandra Richards is a 58 y.o. female with HTN, T2DM, CKD, HFpEF, metastatic breast cancer who presented to [facility] for nausea, diarrhea, and emesis x3 days in the setting of recent chemotherapy/immunotherapy and abx use.             General  Care Manager assessed the patient by : In person interview with patient  Orientation Level: Oriented X4  Who provides care at home?: Family member, Paid caregiver(She will be getting PCS services, daughters also come by often.)  Reason for referral: Discharge Planning    Contact/Decision Maker  Extended Emergency Contact Information  Primary Emergency Contact: Emilio Aspen  Mobile Phone: [phone]  Relation: Daughter  Preferred language: ENGLISH  Interpreter needed? No  Secondary Emergency Contact: Amma, Crear  Mobile Phone: [phone]  Relation: Son    Armed forces operational officer Next of Kin / Guardian / POA / Advance Directives     HCDM (patient stated preference): Camari, Quintanilla - Son - [phone]    Advance Directive (Medical Treatment)  Does patient have an advance directive covering medical treatment?: Patient does not have advance directive covering medical treatment.  Reason patient does not have an advance directive covering medical treatment:: Patient does not wish to complete one at this time.    Health Care Decision Maker [HCDM] (Medical & Mental Health Treatment)  Healthcare Decision Maker: HCDM documented in the HCDM/Contact Info section.(HCDM is son, Ron Engineer, structural)  Information offered on HCDM, Medical & Mental Health advance directives:: Patient declined information. Advance Directive (Mental Health Treatment)  Does patient have an advance directive covering mental health treatment?: Patient does not have advance directive covering mental health treatment.    Patient Information  Lives with: Alone    Type of Residence: Private residence(Single story home with 2 steps to enter.  She is waiting on handicapp apartment in [place])        Location/Detail: [address], [place] [address]  [facility]    Support Systems/Concerns: Children, Family Members, Friends/Neighbors    Responsibilities/Dependents at home?: No    Home Care services in place prior to admission?: No        Type of Residence: Mailing Address:  [address] [place] [address]  Contacts:    Patient Phone Number:   Telephone Information:   Mobile [phone]           Medical Provider(s): [facility]  Reason for Admission: Admitting Diagnosis:  Loose stools [R19.5]  Decreased oral intake [R63.8]  Intractable nausea and vomiting [R11.2]  Malignant neoplasm of female breast, unspecified estrogen receptor status, unspecified laterality, unspecified site of breast (CMS-HCC) [C50.919]  Past Medical History:   has a past medical history of Cancer (CMS-HCC), Diabetes mellitus (CMS-HCC), HLD (hyperlipidemia), Hypertension, Non-intractable vomiting with nausea (01/04/2019), and Renal disorder.  Past Surgical History:   has no past surgical history on file.   Previous admit date: 12/16/2018    Primary Insurance- Payor: MEDICAID [place] / Plan: MEDICAID [place] / Product Type: *No Product type* /   Secondary Insurance ??? None  Prescription Coverage ??? Yes  Preferred Pharmacy - [id number] [place], [place] - [address]  [facility], [place] - [address]  [facility]  [facility]    Transportation home: TBD  Level of function prior to admission: Requires Assistance Equipment Currently Used at Home: walker, rolling, commode       Currently receiving outpatient dialysis?: No       Financial Information       Need for financial assistance?: No(On disability and has Medicaid)       Social Determinants of Health  Social History     Socioeconomic History   ??? Marital status: Single     Spouse name: None   ??? Number of children: 3   ??? Years of education: None   ??? Highest education level: None   Occupational History   ??? None   Social Needs   ??? Financial resource strain: None   ??? Food insecurity     Worry: Patient refused     Inability: Patient refused   ??? Transportation needs     Medical: No     Non-medical: No   Tobacco Use   ??? Smoking status: Former Smoker     Types: Cigarettes   ??? Smokeless tobacco: Never Used   Substance and Sexual Activity   ??? Alcohol use: No     Alcohol/week: 0.0 standard drinks   ??? Drug use: Yes     Types: Marijuana     Comment: Last use in April 2020   ??? Sexual activity: Not Currently   Lifestyle   ??? Physical activity     Days per week: None     Minutes per session: None   ??? Stress: None   Relationships   ??? Social Wellsite geologist on phone: None     Gets together: None     Attends religious service: None     Active member of club or organization: None     Attends meetings of clubs or organizations: None     Relationship status: None   Other Topics Concern   ??? None   Social History Narrative    Lives alone in [place].  Never married.  3 children (1 son and 2 daughters).  Son live in [place] and she plans to move to an apartment near him. 23 grandchildren.   Former smoker.  Half P[employer] x5-10 years.  Quit in 87s.  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  Associates degree in computer programming and bachelors in hospitality/tourism.     Housing/Utilities   ??? Within the past 12 months, have you ever stayed: outside, in a car, in a tent, in an overnight shelter, or temporarily in someone else's home (i.e. couch-surfing)? ??? Are you worried about losing your housing?     ??? Within the past 12 months, have you been unable to get utilities (heat, electricity) when it was really needed?       Literacy   ??? How often do you need to have someone help you when you read instructions, pamphlets, or other written material from your doctor or pharmacy?         Discharge Needs Assessment  Concerns to be Addressed: other (see comments)(TBD)    Clinical Risk Factors: Principal Diagnosis: Cancer, Stroke, COPD, Heart Failure, AMI, Pneumonia, Joint Replacment, Multiple Diagnoses (Chronic), Lives Alone or Absence of  Caregiver to Assist with Discharge and Home Care    Barriers to taking medications: No    Prior overnight hospital stay or ED visit in last 90 days: Yes    Readmission Within the Last 30 Days: no previous admission in last 30 days         Anticipated Changes Related to Illness: inability to care for self    Equipment Needed After Discharge: shower chair    Discharge Facility/Level of Care Needs: other (see comments)(TBD)    Readmission  Risk of Unplanned Readmission Score: UNPLANNED READMISSION SCORE: 39%  Predictive Model Details           39% (High) Factors Contributing to Score   Calculated 03/08/2019 09:13 18% Number of ED visits in last six months is 5   [facility] Risk of Unplanned Readmission Model 16% Number of active Rx orders is 37     9% Number of hospitalizations in last year is 3     8% Charlson Comorbidity Index is 11     7% Diagnosis of cancer is present     6% ECG/EKG order is present in last 6 months     6% Latest calcium is low (8.0 mg/dL)     5% Diagnosis of electrolyte disorder is present     4% Imaging order is present in last 6 months     4% Latest hemoglobin is low (9.9 g/dL)     3% Age is 58     3% Diagnosis of deficiency anemia is present     3% Active anticoagulant Rx order is present     3% Latest creatinine is high (1.44 mg/dL)     3% Diagnosis of renal failure is present     2% Current length of stay is 2.66 days 1% Future appointment is scheduled     Readmitted Within the Last 30 Days? (No if blank)   Patient at risk for readmission?: Yes    Discharge Plan  Screen findings are: Discharge planning needs identified or anticipated (Comment).(TBD)    Expected Discharge Date: 03/09/2019    Expected Transfer from Critical Care: (NA)    Patient and/or family were provided with choice of facilities / services that are available and appropriate to meet post hospital care needs?: N/A       Initial Assessment complete?: Yes

## 2019-03-08 NOTE — Unmapped (Signed)
Pt alert and oriented, intermittently drowsy.  Pt hypertensive, otherwise VSS.  Pt remains free from falls and afebrile.  Pt denies pain.  Pt remains on room air with good results.  Pt is tired of treatment but feels as if her family will be upset and disagree with her decision so she is planning on continuing with treatment but expressed interest in having a family meeting to discuss her feelings.  Pt states she is tired of having to come to the hospital and spend so much time here instead of home with family.  Safety measures remain in place with bed alarm on.  Will continue to monitor.     Vitals:    03/07/19 2057 03/07/19 2300 03/08/19 0400 03/08/19 0409   BP: 167/90 125/70 150/79    Pulse: 91 73 76 77   Resp:  18 20    Temp:  36.5 ??C (97.7 ??F) 36.8 ??C (98.2 ??F)    TempSrc:  Oral Oral    SpO2:  100% 100% 100%   Weight:       Height:               Problem: Adult Inpatient Plan of Care  Goal: Plan of Care Review  Outcome: Progressing  Flowsheets (Taken 03/08/2019 0514)  Progress: no change  Plan of Care Reviewed With: patient  Goal: Patient-Specific Goal (Individualization)  Outcome: Progressing  Flowsheets (Taken 03/08/2019 0514)  Patient-Specific Goals (Include Timeframe): Patient would like to get her blood pressure back under control without nausea and vomiting.  Individualized Care Needs: Cluster care, empathetic listening, presence, close monitoring.  Anxieties, Fears or Concerns: Patient is concerned about getting her BP, Nausea and Vomiting under control.  Goal: Absence of Hospital-Acquired Illness or Injury  Outcome: Progressing  Intervention: Identify and Manage Fall Risk  Flowsheets (Taken 03/08/2019 0514)  Safety Interventions:   aspiration precautions   assistive device   bed alarm   commode/urinal/bedpan at bedside   environmental modification   fall reduction program maintained   isolation precautions   lighting adjusted for tasks/safety   low bed   mobility aid nonskid shoes/slippers when out of bed   room near unit station   supervised activity   toileting scheduled  Intervention: Prevent Skin Injury  Flowsheets (Taken 03/08/2019 0514)  Pressure Reduction Techniques: frequent weight shift encouraged  Intervention: Prevent VTE (venous thromboembolism)  Flowsheets (Taken 03/08/2019 0514)  VTE Prevention/Management: ambulation promoted  Intervention: Prevent Infection  Flowsheets (Taken 03/08/2019 0514)  Infection Prevention:   cohorting utilized   environmental surveillance performed   equipment surfaces disinfected   handwashing promoted   personal protective equipment utilized   rest/sleep promoted   single patient room provided   visitors restricted/screened  Goal: Optimal Comfort and Wellbeing  Outcome: Progressing  Intervention: Monitor Pain and Promote Comfort  Flowsheets (Taken 03/08/2019 0514)  Pain Management Interventions:   care clustered   pain management plan reviewed with patient/caregiver   quiet environment facilitated  Intervention: Provide Person-Centered Care  Flowsheets (Taken 03/08/2019 0514)  Trust Relationship/Rapport:   care explained   choices provided   emotional support provided   empathic listening provided   questions answered   questions encouraged   reassurance provided   thoughts/feelings acknowledged  Goal: Readiness for Transition of Care  Outcome: Progressing  Goal: Rounds/Family Conference  Outcome: Progressing

## 2019-03-09 MED ADMIN — acetaminophen (TYLENOL) tablet 650 mg: 650 mg | ORAL | @ 16:00:00 | Stop: 2019-03-11

## 2019-03-09 MED ADMIN — cholecalciferol (vitamin D3) tablet 2,000 Units: 2000 [IU] | ORAL | @ 13:00:00 | Stop: 2019-03-11

## 2019-03-09 MED ADMIN — amLODIPine (NORVASC) tablet 10 mg: 10 mg | ORAL | @ 13:00:00 | Stop: 2019-03-11

## 2019-03-09 MED ADMIN — carvediloL (COREG) tablet 25 mg: 25 mg | ORAL | @ 02:00:00 | Stop: 2019-03-11

## 2019-03-09 MED ADMIN — cloNIDine HCL (CATAPRES) tablet 0.3 mg: .3 mg | ORAL | @ 02:00:00 | Stop: 2019-03-11

## 2019-03-09 MED ADMIN — insulin lispro (HumaLOG) injection 1-20 Units: 1-20 [IU] | SUBCUTANEOUS | @ 13:00:00 | Stop: 2019-03-11

## 2019-03-09 MED ADMIN — cloNIDine HCL (CATAPRES) tablet 0.3 mg: .3 mg | ORAL | @ 13:00:00 | Stop: 2019-03-11

## 2019-03-09 MED ADMIN — isosorbide mononitrate (IMDUR) 24 hr tablet 120 mg: 120 mg | ORAL | @ 14:00:00 | Stop: 2019-03-11

## 2019-03-09 MED ADMIN — senna (SENOKOT) tablet 2 tablet: 2 | ORAL | @ 13:00:00 | Stop: 2019-03-11

## 2019-03-09 MED ADMIN — lisinopriL (PRINIVIL,ZESTRIL) tablet 20 mg: 20 mg | ORAL | @ 13:00:00 | Stop: 2019-03-11

## 2019-03-09 MED ADMIN — atorvastatin (LIPITOR) tablet 40 mg: 40 mg | ORAL | @ 02:00:00 | Stop: 2019-03-11

## 2019-03-09 MED ADMIN — insulin lispro (HumaLOG) injection 1-20 Units: 1-20 [IU] | SUBCUTANEOUS | @ 17:00:00 | Stop: 2019-03-11

## 2019-03-09 MED ADMIN — insulin lispro (HumaLOG) injection 1-20 Units: 1-20 [IU] | SUBCUTANEOUS | @ 21:00:00 | Stop: 2019-03-11

## 2019-03-09 MED ADMIN — melatonin tablet 3 mg: 3 mg | ORAL | @ 23:00:00 | Stop: 2019-03-11

## 2019-03-09 MED ADMIN — insulin NPH (HumuLIN,NovoLIN) injection 15 Units: 15 [IU] | SUBCUTANEOUS | @ 14:00:00 | Stop: 2019-03-09

## 2019-03-09 NOTE — Unmapped (Signed)
Oncology (MDE2) Progress Note  Subjective & 24 Hour Events:   No events overnight. Patient was concerned this morning that she would be transferred to SNF forever. Reiterated to the patient that the SNF was a short term option so she can get stronger with the goal of getting home. She was reassured by this. Patient is ready for discharge to SNF when accepted. She is having regular bowel movements in the hospital. Reemphazised the importance of daily bowel movements while taking narcotic pain medication. BPs have been appropriate.     Assessment & Plan:   Jame Seelig is a 58 y.o. female with HTN, T2DM, CKD, HFpEF, metastatic breast cancer who presented to [facility] for nausea, diarrhea, and emesis x3 days in the setting of recent chemotherapy/immunotherapy and abx use.     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 Received palliative radiation 9/27-10/7 for a total of 3000 cGy. Treated with capecitabine (Xeloda) 1500 mg bid for 14 days then 7 off, initiated 01/12/19.  Reports compliance with schedule.  Was unfortunately held due to creatinine clearance less than 30.  After repeat lab work recently, creatinine clearance was appropriate and Xeloda was restarted a few days prior to current presentation. Received denosumab on 12/3 as well which has high frequency of nausea, diarrhea. CT chest with pleural effusion, likely malignant (stable on RA). -Hold chemo while inpatient  ??  #Diarrhea - Nausea, Emesis, reduced PO intake - Constipatio: differential diagnoses for the etiology include chemotherapy side effect, Xgeva side effect, viral gastroenteritis, constipation w/encopresis, colitis, other infectious diarrhea. Previously admitted for similar symptoms found to have colitis and stool burden. No rebounding or guarding, but TTP to deep palpation on L abdomen. Furthermore, endorses recent antibiotic use, thus infectious etiologies must be ruled out. Long history of constipation although reports relatively normal BMs prior to current illness. Recent Xgeva administration which could also be contributing given SE profile of nausea/vomiting. S/p zofran, phenergan, reglan at [facility] ED. COVID negative. KUB and CT AP showing significant stool burden, making stool overflow due to constipation more likely, especially with patient's baseline constipation and home opioid use.  -BCx2 - negative to date  -Miralax, senna BID, goal 1BM/day  ??  #HTN: goal per nephrology BP 130-140/~80 to avoid hypotension, unable to tolerate PO meds currently. Last took meds day before admission. Reports compliance. Significantly hypertensive on admission with SBPs 190-200s. May have some component of rebound HTN from inability to take clonidine. BPs more stable since admission and tolerating PO meds.  - Continue home: clonidine, imdur, lisinopril, carvedilol   - Continue holding home: hydralazine, lasix  ??  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  #DM2: on 15 units of 70/30 bid at home. Hyperglycemic on admission.   -Last HbA1c: 11.6 on 9/19.  -Continue 15u BID, will continue to increase as PO  increases.  -SSI  ??  #HFpEF: Last echo 5/20: EF 55% with G2DD and no valvular dz. Reportedly takes Lasix 40mg  PO daily at home. Appears euvolemic on exam. Unable to tolerate PO meds currently. -Holding diuresis for now, will restart when at full BP meds  -Atorvastatin as tolerated.   -Aggressive HTN control as above.     Daily Checklist:  Diet: Regular Diet  DVT PPx: Lovenox 40mg  q24h  Electrolytes: Replete Potassium to >/=4 and Magnesium to >/=2  Code Status: Full Code  Dispo: Admit to floor- E2    Objective:   Temp:  [37 ??C-37.5 ??C] 37.1 ??C  Heart Rate:  [69-87] 77  Resp:  [16-18] 18  BP: (101-150)/(58-80) 143/70  SpO2:  [98 %-100 %] 98 %    Gen: Depressed appearing woman lying in bed, NAD, alert, oriented, answers questions appropriately  Heart: RRR, S1, S2, no M/R/G  Lungs: Comfortable and conversant on RA. No use of accessory muscles. Lungs clear bilaterally.  GI: Normoactive bowel sounds, soft, NTND, no rebound/guarding  Extremities: no clubbing, cyanosis, or edema    Labs/Studies: Labs and Studies from the last 24hrs per EMR and Reviewed   --------------  Kelvin Cellar, MD, MPH  Med Peds PGY-1

## 2019-03-09 NOTE — Unmapped (Signed)
VSS and A&Ox4 throughout shift.  No acute events.  Patient slept and ate throughout shift.  Pt did manage to pivot to chair with walker and sit for a short time.  Required assistance with daily cares including toileting and meal set-up.  WCTM

## 2019-03-09 NOTE — Unmapped (Signed)
AO x 4, drowsy and lethargic throughout this shift but arousable to voice and responds appropriately to commands. No calls from tele. Pt is not eating on a regular schedule so NPH dosage increased today per MDE2. Pt withdrawn and appears depressed, activity level and appetite low. Pt seen by [facility] care consult for toenail trim today. WCTM.

## 2019-03-09 NOTE — Unmapped (Signed)
Pt alert x oriented, afebrile, VSS. Pt appeared fatigued at times but did not have episodes of unresponsiveness. Pt had no reports of pain. Bed alarm activated for safety. Pt free from falls and injuries. Will continue to monitor pt status.

## 2019-03-09 NOTE — Unmapped (Signed)
Oncology (MDE2) Progress Note  Subjective & 24 Hour Events:   No acute events overnight. No unresponsive episodes today. Depressed affect but states that she feels well overall. Has decided she should go to SNF to get stronger. Minimal nausea, no vomiting, having bowel movements.     Medically ready for discharge, SNF referrals placed by case management.    Assessment & Plan:   Jalyne Brodzinski is a 58 y.o. female with HTN, T2DM, CKD, HFpEF, metastatic breast cancer who presented to [facility] for nausea, diarrhea, and emesis x3 days in the setting of recent chemotherapy/immunotherapy and abx use.     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 Received palliative radiation 9/27-10/7 for a total of 3000 cGy. Treated with capecitabine (Xeloda) 1500 mg bid for 14 days then 7 off, initiated 01/12/19.  Reports compliance with schedule.  Was unfortunately held due to creatinine clearance less than 30.  After repeat lab work recently, creatinine clearance was appropriate and Xeloda was restarted a few days prior to current presentation. Received denosumab on 12/3 as well which has high frequency of nausea, diarrhea. CT chest with pleural effusion, likely malignant (stable on RA).   -Hold chemo while inpatient #Diarrhea - Nausea, Emesis, reduced PO intake - Constipatio: differential diagnoses for the etiology include chemotherapy side effect, Xgeva side effect, viral gastroenteritis, constipation w/encopresis, colitis, other infectious diarrhea. Previously admitted for similar symptoms found to have colitis and stool burden. No rebounding or guarding, but TTP to deep palpation on L abdomen. Furthermore, endorses recent antibiotic use, thus infectious etiologies must be ruled out. Long history of constipation although reports relatively normal BMs prior to current illness. Recent Xgeva administration which could also be contributing given SE profile of nausea/vomiting. S/p zofran, phenergan, reglan at [facility] ED. COVID negative. KUB and CT AP showing significant stool burden, making stool overflow due to constipation more likely, especially with patient's baseline constipation and home opioid use.  -BCx2 - negative to date  -Miralax, senna BID  ??  #HTN: goal per nephrology BP 130-140/~80 to avoid hypotension, unable to tolerate PO meds currently. Last took meds day before admission. Reports compliance. Significantly hypertensive on admission with SBPs 190-200s. May have some component of rebound HTN from inability to take clonidine. BPs more stable since admission and tolerating PO meds.  - Continue home: clonidine, imdur, lisinopril, carvedilol   - Continue holding home: hydralazine, lasix  ??  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  #DM2: on 15 units of 70/30 bid at home. Hyperglycemic on admission.   -Last HbA1c: 11.6 on 9/19.  -Increase NPH to 15u BID, will continue to increase as PO increases.  -SSI  ??  #HFpEF: Last echo 5/20: EF 55% with G2DD and no valvular dz. Reportedly takes Lasix 40mg  PO daily at home. Appears euvolemic on exam. Unable to  tolerate PO meds currently.   -Holding diuresis for now, will restart when at full BP meds -Atorvastatin as tolerated.   -Aggressive HTN control as above.     Daily Checklist:  Diet: Regular Diet  DVT PPx: Lovenox 40mg  q24h  Electrolytes: Replete Potassium to >/=4 and Magnesium to >/=2  Code Status: Full Code  Dispo: Admit to floor- E2    Objective:   Temp:  [36.4 ??C-37.4 ??C] 37.4 ??C  Heart Rate:  [73-87] 81  Resp:  [16-20] 16  BP: (101-171)/(58-98) (P) 126/69  SpO2:  [98 %-100 %] (P) 98 %    Gen: Depressed appearing woman lying in bed, NAD, alert, oriented, answers questions appropriately  Heart: RRR, S1, S2, no M/R/G  Lungs: Comfortable and conversant on RA. No use of accessory muscles. Lungs clear bilaterally.  GI: Normoactive bowel sounds, soft, NTND, no rebound/guarding  Extremities: no clubbing, cyanosis, or edema    Labs/Studies: Labs and Studies from the last 24hrs per EMR and Reviewed   --------------  Henry Russel, MD  Internal Medicine PGY-2

## 2019-03-09 NOTE — Unmapped (Signed)
Order was placed for a PIV by Venous Access Team (VAT).  Patient was assessed at bedside for placement of a PIV. Mask and protective eyewear were donned. Access was obtained. Blood return noted.  Dressing intact and device well secured.  Flushed with normal saline.  Pt advised to inform RN of any s/s of discomfort at the PIV site.    Workup / Procedure Time:  15 minutes       RN was notified.       Thank you,     Ashley Akin RN Venous Access Team

## 2019-03-10 LAB — CBC W/ AUTO DIFF
BASOPHILS ABSOLUTE COUNT: 0 10*9/L (ref 0.0–0.1)
BASOPHILS RELATIVE PERCENT: 0.3 %
EOSINOPHILS RELATIVE PERCENT: 1.3 %
HEMOGLOBIN: 8.5 g/dL — ABNORMAL LOW (ref 12.0–16.0)
LARGE UNSTAINED CELLS: 3 % (ref 0–4)
LYMPHOCYTES ABSOLUTE COUNT: 0.6 10*9/L — ABNORMAL LOW (ref 1.5–5.0)
LYMPHOCYTES RELATIVE PERCENT: 10.6 %
MEAN CORPUSCULAR HEMOGLOBIN CONC: 33.4 g/dL (ref 31.0–37.0)
MEAN CORPUSCULAR HEMOGLOBIN: 31.7 pg (ref 26.0–34.0)
MEAN CORPUSCULAR VOLUME: 94.9 fL (ref 80.0–100.0)
MEAN PLATELET VOLUME: 9.6 fL (ref 7.0–10.0)
MONOCYTES ABSOLUTE COUNT: 0.8 10*9/L (ref 0.2–0.8)
MONOCYTES RELATIVE PERCENT: 13.6 %
NEUTROPHILS ABSOLUTE COUNT: 4.3 10*9/L (ref 2.0–7.5)
NEUTROPHILS RELATIVE PERCENT: 71.4 %
PLATELET COUNT: 320 10*9/L (ref 150–440)
RED BLOOD CELL COUNT: 2.69 10*12/L — ABNORMAL LOW (ref 4.00–5.20)
RED CELL DISTRIBUTION WIDTH: 17.6 % — ABNORMAL HIGH (ref 12.0–15.0)
WBC ADJUSTED: 6.1 10*9/L (ref 4.5–11.0)

## 2019-03-10 LAB — MAGNESIUM: Magnesium:MCnc:Pt:Ser/Plas:Qn:: 1.6

## 2019-03-10 LAB — BASIC METABOLIC PANEL
ANION GAP: 10 mmol/L (ref 7–15)
BLOOD UREA NITROGEN: 25 mg/dL — ABNORMAL HIGH (ref 7–21)
BUN / CREAT RATIO: 14
CO2: 21 mmol/L — ABNORMAL LOW (ref 22.0–30.0)
CREATININE: 1.81 mg/dL — ABNORMAL HIGH (ref 0.60–1.00)
EGFR CKD-EPI AA FEMALE: 35 mL/min/{1.73_m2} — ABNORMAL LOW (ref >=60)
GLUCOSE RANDOM: 170 mg/dL (ref 70–179)
POTASSIUM: 3.9 mmol/L (ref 3.5–5.0)
SODIUM: 132 mmol/L — ABNORMAL LOW (ref 135–145)

## 2019-03-10 LAB — WBC ADJUSTED: Leukocytes:NCnc:Pt:Bld:Qn:: 6.1

## 2019-03-10 LAB — BLOOD UREA NITROGEN: Urea nitrogen:MCnc:Pt:Ser/Plas:Qn:: 25 — ABNORMAL HIGH

## 2019-03-10 MED ADMIN — oxyCODONE (ROXICODONE) immediate release tablet 5 mg: 5 mg | ORAL | @ 08:00:00 | Stop: 2019-03-11

## 2019-03-10 MED ADMIN — atorvastatin (LIPITOR) tablet 40 mg: 40 mg | ORAL | @ 02:00:00 | Stop: 2019-03-11

## 2019-03-10 MED ADMIN — carvediloL (COREG) tablet 25 mg: 25 mg | ORAL | @ 02:00:00 | Stop: 2019-03-11

## 2019-03-10 MED ADMIN — senna (SENOKOT) tablet 2 tablet: 2 | ORAL | @ 02:00:00 | Stop: 2019-03-11

## 2019-03-10 MED ADMIN — oxyCODONE (ROXICODONE) immediate release tablet 5 mg: 5 mg | ORAL | @ 13:00:00 | Stop: 2019-03-11

## 2019-03-10 MED ADMIN — insulin NPH (HumuLIN,NovoLIN) injection 17 Units: 17 [IU] | SUBCUTANEOUS | @ 02:00:00 | Stop: 2019-03-10

## 2019-03-10 MED ADMIN — insulin lispro (HumaLOG) injection 1-20 Units: 1-20 [IU] | SUBCUTANEOUS | @ 17:00:00 | Stop: 2019-03-11

## 2019-03-10 MED ADMIN — isosorbide mononitrate (IMDUR) 24 hr tablet 120 mg: 120 mg | ORAL | @ 15:00:00 | Stop: 2019-03-11

## 2019-03-10 MED ADMIN — insulin lispro (HumaLOG) injection 1-20 Units: 1-20 [IU] | SUBCUTANEOUS | @ 02:00:00 | Stop: 2019-03-11

## 2019-03-10 MED ADMIN — amLODIPine (NORVASC) tablet 10 mg: 10 mg | ORAL | @ 15:00:00 | Stop: 2019-03-11

## 2019-03-10 MED ADMIN — insulin lispro (HumaLOG) injection 1-20 Units: 1-20 [IU] | SUBCUTANEOUS | @ 13:00:00 | Stop: 2019-03-11

## 2019-03-10 MED ADMIN — insulin lispro (HumaLOG) injection 1-20 Units: 1-20 [IU] | SUBCUTANEOUS | @ 23:00:00 | Stop: 2019-03-11

## 2019-03-10 MED ADMIN — polyethylene glycol (MIRALAX) packet 17 g: 17 g | ORAL | @ 15:00:00 | Stop: 2019-03-11

## 2019-03-10 MED ADMIN — acetaminophen (TYLENOL) tablet 650 mg: 650 mg | ORAL | @ 11:00:00 | Stop: 2019-03-11

## 2019-03-10 MED ADMIN — cloNIDine HCL (CATAPRES) tablet 0.3 mg: .3 mg | ORAL | @ 02:00:00 | Stop: 2019-03-11

## 2019-03-10 NOTE — Unmapped (Signed)
Pt alert x oriented, afebrile, VSS. Pt has received Oxy x2 this shift for breast and leg pain. Pt had good results with this. Pt had no episodes of unresponsiveness. Pt free from falls and injuries. Bed alarm activated for safety. Will continue to monitor pt status.

## 2019-03-10 NOTE — Unmapped (Signed)
Oncology (MDE2) Progress Note  Subjective & 24 Hour Events:   Patient has now stated she does not want to go to SNF to due fears of COVID. She wants to go home with home health w PT/OT and specifically wants to use [facility]. Have been working with Case Management but this has been difficult to set up on short notice on a Sunday. Hopefully the referral will go through tomorrow. However, given Ms. Gearing's desires for a specific HH agency, I am very nervous discharging her before Home Health referral is processed as I don't want her to be discharged without a clear plan for fear she would be lost to follow up. Patient's mental status much improved from previous days, very alert.     Assessment & Plan:   Carlyann Placide is a 58 y.o. female with HTN, T2DM, CKD, HFpEF, metastatic breast cancer who presented to [facility] for nausea, diarrhea, and emesis x3 days in the setting of recent chemotherapy/immunotherapy and abx use.     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 Received palliative radiation 9/27-10/7 for a total of 3000 cGy. Treated with capecitabine (Xeloda) 1500 mg bid for 14 days then 7 off, initiated 01/12/19.  Reports compliance with schedule.  Was unfortunately held due to creatinine clearance less than 30.  After repeat lab work recently, creatinine clearance was appropriate and Xeloda was restarted a few days prior to current presentation. Received denosumab on 12/3 as well which has high frequency of nausea, diarrhea. CT chest with pleural effusion, likely malignant (stable on RA).   -Hold chemo while inpatient  ??  #Diarrhea - Nausea, Emesis, reduced PO intake - Constipatio: differential diagnoses for the etiology include chemotherapy side effect, Xgeva side effect, viral gastroenteritis, constipation w/encopresis, colitis, other infectious diarrhea. Previously admitted for similar symptoms found to have colitis and stool burden. No rebounding or guarding, but TTP to deep palpation on L abdomen. Furthermore, endorses recent antibiotic use, thus infectious etiologies must be ruled out. Long history of constipation although reports relatively normal BMs prior to current illness. Recent Xgeva administration which could also be contributing given SE profile of nausea/vomiting. S/p zofran, phenergan, reglan at [facility] ED. COVID negative. KUB and CT AP showing significant stool burden, making stool overflow due to constipation more likely, especially with patient's baseline constipation and home opioid use.  -BCx2 - negative to date  -Miralax, senna BID, goal 1BM/day #HTN: goal per nephrology BP 130-140/~80 to avoid hypotension, unable to tolerate PO meds currently. Last took meds day before admission. Reports compliance. Significantly hypertensive on admission with SBPs 190-200s. May have some component of rebound HTN from inability to take clonidine. BPs more stable since admission and tolerating PO meds.  - Continue home: clonidine, imdur, lisinopril, carvedilol, lasix   - Continue holding home: hydralazine  ??  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  #  DM2: on 15 units of 70/30 bid at home. Hyperglycemic on admission.   -Last HbA1c: 11.6 on 9/19.  -Increase NPH to 20u BID  -SSI  ??  #HFpEF: Last echo 5/20: EF 55% with G2DD and no valvular dz. Reportedly takes Lasix 40mg  PO daily at home. Appears euvolemic on exam.   -Lasix 40 mg daily as above  -Atorvastatin 40 mg nightly   -Aggressive HTN control as above.     Daily Checklist:  Diet: Regular Diet  DVT PPx: Lovenox 40mg  q24h  Electrolytes: Replete Potassium to >/=4 and Magnesium to >/=2  Code Status: Full Code  Dispo: Admit to floor- E2    Objective:   Temp:  [36.9 ??C-37.8 ??C] 37 ??C  Heart Rate:  [72-81] 77  Resp:  [17-18] 18  BP: (108-165)/(52-79) 128/66  SpO2:  [96 %-99 %] 98 %    Gen: Alert appearing woman lying in bed, NAD, oriented, answers questions appropriately  Heart: RRR, S1, S2, no M/R/G  Lungs: Comfortable and conversant on RA. No use of accessory muscles.   GI: Normoactive bowel sounds, soft, NTND, no rebound/guarding  Extremities: no clubbing, cyanosis, or edema    Labs/Studies: Labs and Studies from the last 24hrs per EMR and Reviewed   --------------  Kelvin Cellar, MD, MPH  Med Peds PGY-1

## 2019-03-11 MED ORDER — INSULIN HUMAN U-100 NPH-REGULR 70-30 MIX 100 UNIT/ML SUBCUTANEOUS SUSP
Freq: Two times a day (BID) | SUBCUTANEOUS | 0 refills | 30 days | Status: CP
Start: 2019-03-11 — End: 2019-04-10

## 2019-03-11 MED ORDER — ONDANSETRON 4 MG DISINTEGRATING TABLET
ORAL_TABLET | Freq: Four times a day (QID) | ORAL | 0 refills | 8.00000 days | Status: CP | PRN
Start: 2019-03-11 — End: 2019-04-10

## 2019-03-11 MED ADMIN — carvediloL (COREG) tablet 25 mg: 25 mg | ORAL | @ 02:00:00 | Stop: 2019-03-11

## 2019-03-11 MED ADMIN — lisinopriL (PRINIVIL,ZESTRIL) tablet 20 mg: 20 mg | ORAL | @ 15:00:00 | Stop: 2019-03-11

## 2019-03-11 MED ADMIN — melatonin tablet 3 mg: 3 mg | ORAL | @ 02:00:00 | Stop: 2019-03-11

## 2019-03-11 MED ADMIN — polyethylene glycol (MIRALAX) packet 17 g: 17 g | ORAL | @ 02:00:00 | Stop: 2019-03-11

## 2019-03-11 MED ADMIN — insulin lispro (HumaLOG) injection 1-20 Units: 1-20 [IU] | SUBCUTANEOUS | @ 17:00:00 | Stop: 2019-03-11

## 2019-03-11 MED ADMIN — acetaminophen (TYLENOL) tablet 650 mg: 650 mg | ORAL | @ 06:00:00 | Stop: 2019-03-11

## 2019-03-11 MED ADMIN — cloNIDine HCL (CATAPRES) tablet 0.3 mg: .3 mg | ORAL | @ 02:00:00 | Stop: 2019-03-11

## 2019-03-11 MED ADMIN — oxyCODONE (ROXICODONE) immediate release tablet 5 mg: 5 mg | ORAL | @ 06:00:00 | Stop: 2019-03-11

## 2019-03-11 MED ADMIN — senna (SENOKOT) tablet 2 tablet: 2 | ORAL | @ 15:00:00 | Stop: 2019-03-11

## 2019-03-11 MED ADMIN — furosemide (LASIX) tablet 40 mg: 40 mg | ORAL | @ 15:00:00 | Stop: 2019-03-11

## 2019-03-11 MED ADMIN — insulin NPH (HumuLIN,NovoLIN) injection 20 Units: 20 [IU] | SUBCUTANEOUS | @ 14:00:00 | Stop: 2019-03-11

## 2019-03-11 MED ADMIN — insulin lispro (HumaLOG) injection 1-20 Units: 1-20 [IU] | SUBCUTANEOUS | @ 03:00:00 | Stop: 2019-03-11

## 2019-03-11 MED ADMIN — cloNIDine HCL (CATAPRES) tablet 0.3 mg: .3 mg | ORAL | @ 15:00:00 | Stop: 2019-03-11

## 2019-03-11 MED ADMIN — enoxaparin (LOVENOX) syringe 40 mg: 40 mg | SUBCUTANEOUS | @ 02:00:00 | Stop: 2019-03-11

## 2019-03-11 MED ADMIN — oxyCODONE (ROXICODONE) immediate release tablet 5 mg: 5 mg | ORAL | @ 21:00:00 | Stop: 2019-03-11

## 2019-03-11 NOTE — Unmapped (Addendum)
Alert and oriented x 4,respiration even and unlabored.Patient medicated with roxicodone x 2 over the shift for back pain.Vital sign stable ,free from no episode of unresponsiveness.  Safety measures maintained  Problem: Fall Injury Risk  Goal: Absence of Fall and Fall-Related Injury  Outcome: Progressing     Problem: Self-Care Deficit  Goal: Improved Ability to Complete Activities of Daily Living  Outcome: Progressing     Problem: Hypertension Comorbidity  Goal: Blood Pressure in Desired Range  Outcome: Progressing     Problem: Adult Inpatient Plan of Care  Goal: Absence of Hospital-Acquired Illness or Injury  Outcome: Progressing     Problem: Adult Inpatient Plan of Care  Goal: Patient-Specific Goal (Individualization)  Outcome: Progressing

## 2019-03-11 NOTE — Unmapped (Signed)
Physician Discharge Summary [facility]  4 ONC [facility]  [address]  [place] [address]  Dept: [phone]  Loc: [phone]     Identifying Information:   Harika Laidlaw [facility]  10/30/1960  [id number]    Primary Care Physician: Tyson Babinski SVC PROSPECT H     Referring Physician: Referred Self     Code Status: Full Code    Admit Date: 03/05/2019    Discharge Date: 03/11/2019     Discharge To: Home with Home Health and/or PT/OT    Discharge Service: [facility] - MDE - Solid Tumor     Discharge Attending Physician: Marco Collie, MD    Discharge Diagnoses:  Principal Problem:    Non-intractable vomiting with nausea  Active Problems:    Type 2 diabetes mellitus, with long-term current use of insulin (CMS-HCC)    Anemia    (HFpEF) heart failure with preserved ejection fraction (CMS-HCC)    AKI (acute kidney injury) (CMS-HCC)    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 *      Outpatient Provider Follow Up Issues:   Follow-up Plan after discharge:  1. Issues related to hospitalization: Hypertension (stopped home hydralazine), hyperglycemia (increased 70:30 to 20u BID at discharge due to high SSI use)  2. Follow-up appointment with [facility] Oncology/lab draws: 12/24 at 1030 (labs) 12/24 at 1100 (Corrine Tasia Catchings)  3. Oncology specific plans going forward: Continue capecitabine BID    Patient's primary oncologist and/or nurse navigator: Dr. Fayrene Fearing handoff via Epic message or direct conversation?: Yes.    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 On admission, Ms. Milliner's BP was 207/110, likely due to her inability to take her PO clonidine at home with rebound hypertension. Patient underwent CT abdomen to evaluate for possible infection. This showed no infection but significant stool burden. Nausea was controlled with IV phenergan and patient was able to tolerate her home blood pressure meds. These were restarted slowly. At discharge she was on her home regimen, although hydralazine TID was held as her pressures were appropriate this hospitalization. Infectious workup for her diarrhea and nausea was negative and it was determined to likely be due to severe constipation. With aggressive bowel regimen, patient began to feel better.     Of note, on HD1 patient had an episode in which she became suddenly unresponsive to sternal rub. Rapid response was called (BP and glucose were appropriate). Neuro called stat to bedside for evaluation of possible stroke. CT head was negative and patient eventually improved after Narcan administration (patient had not received any opioids at that point in the hospitalization). A very similar event occurred on HD2 without resolution after Narcan administration. Patient was stable on RA, placed on continuous pulse ox monitoring for close monitoring, patient came to a few hours later and stated she is a deep sleeper. MRI head was ordered to evaluate for possible leptomeningeal spread of her cancer which was negative. Patient persistently hyperglycemic on NPH 15u BID (on 15u 70:30 at home). Increased this to NPH 20uBID and discharged the patient on 70:30 20u at home. Patient was instructed to  follow up with PCP for further titration of home antihypertensives and insulin regimen.     Patient with decent sized pleural effusion on chest CT, this may be a malignant effusion. Patient was informed of the presence of this effusion, however she remained comfortable on RA throughout the hospitalization, so elected to continue to monitor this effusion as an outpatient rather than intervene this hospitalization.       Procedures:  No admission procedures for hospital encounter.  ______________________________________________________________________  Discharge Medications:     Your Medication List      STOP taking these medications    hydrALAZINE 100 MG tablet  Commonly known as: APRESOLINE        CHANGE how you take these medications    insulin NPH-insulin regular (70/30) 100 unit/mL (70-30) injection  Commonly known as: HumuLIN/NovoLIN  Inject 0.2 mL (20 Units total) under the skin Two (2) times a day (30 minutes before a meal). Can increase by 5 units if glucose is greater than 140.  What changed: how much to take        CONTINUE taking these medications    amLODIPine 5 MG tablet  Commonly known as: NORVASC  Take 2 tablets (10 mg total) by mouth daily.     atorvastatin 40 MG tablet  Commonly known as: LIPITOR  Take 40 mg by mouth nightly.     CALCIUM ORAL  Take 1 tablet by mouth.     capecitabine 500 MG tablet  Commonly known as: XELODA  Take 3 tablets (1,500 mg total) by mouth Two (2) times a day . For 14 days on then 7 days off     carvediloL 25 MG tablet  Commonly known as: COREG  Take 1 tablet (25 mg total) by mouth Two (2) times a day.     cholecalciferol (vitamin D3) 1,000 unit (25 mcg) tablet  Take 2 tablets (2,000 Units total) by mouth daily.     cloNIDine HCL 0.3 MG tablet  Commonly known as: CATAPRES Take 0.3 mg by mouth Two (2) times a day.     furosemide 40 MG tablet  Commonly known as: LASIX  Take 40 mg by mouth daily. Take additional 40 mg if weight gain >3 lb in 1 day or leg swelling     isosorbide mononitrate 60 MG 24 hr tablet  Commonly known as: IMDUR  Take 2 tablets (120 mg total) by mouth daily.     lisinopriL 20 MG tablet  Commonly known as: PRINIVIL,ZESTRIL  Take 20 mg by mouth daily.     melatonin 5 mg tablet  Take 5 mg by mouth nightly.     mirtazapine 15 MG tablet  Commonly known as: REMERON  Take 1 tablet (15 mg total) by mouth nightly. For sleep and appetite;     ON CALL EXPRESS TEST STRIP Strp  Generic drug: blood sugar diagnostic  Use to check blood sugar by Other route Four (4) times a day (before meals and nightly).     ondansetron 4 MG disintegrating tablet  Commonly known as: ZOFRAN-ODT  Take 1 tablet (4 mg total) by mouth every six (6) hours as needed for nausea.     oxyCODONE 10 mg immediate release tablet  Commonly known as: ROXICODONE  Take 1 tablet (10 mg total) by mouth every six (6) hours as needed for pain.     polyethylene glycol 17 gram/dose powder  Commonly known as: MIRALAX  Take 17 g by mouth Two (2) times a day.  promethazine 25 MG suppository  Commonly known as: PHENERGAN  Insert 1 suppository (25 mg total) into the rectum every eight (8) hours as needed for nausea (vomiting) for up to 30 doses.     senna 8.6 mg tablet  Commonly known as: SENOKOT  Take 2 tablets by mouth Two (2) times a day.     TylenoL 325 MG tablet  Generic drug: acetaminophen  Take 650 mg by mouth every six (6) hours as needed.            Allergies:  Patient has no known allergies.  ______________________________________________________________________  Pending Test Results (if blank, then none):      Most Recent Labs:  All lab results last 24 hours -   Recent Results (from the past 24 hour(s))   POCT Glucose    Collection Time: 03/10/19  5:09 PM   Result Value Ref Range Glucose, POC 293 (H) 70 - 179 mg/dL   POCT Glucose    Collection Time: 03/10/19  5:25 PM   Result Value Ref Range    Glucose, POC 274 (H) 70 - 179 mg/dL   POCT Glucose    Collection Time: 03/10/19  8:54 PM   Result Value Ref Range    Glucose, POC 213 (H) 70 - 179 mg/dL   POCT Glucose    Collection Time: 03/11/19  7:59 AM   Result Value Ref Range    Glucose, POC 130 70 - 179 mg/dL   POCT Glucose    Collection Time: 03/11/19 11:33 AM   Result Value Ref Range    Glucose, POC 227 (H) 70 - 179 mg/dL       Relevant Studies/Radiology (if blank, then none):    Ct Abdomen Pelvis Wo Contrast    Result Date: 03/06/2019 EXAM: CT ABDOMEN PELVIS WO CONTRAST DATE: 03/06/2019 12:35 AM ACCESSION: [id number] UN DICTATED: 03/06/2019 1:49 AM INTERPRETATION LOCATION: [facility] CLINICAL INDICATION: abdominal pain, nausea/vomiting  COMPARISON: 01/15/2019. TECHNIQUE: A spiral CT scan of the abdomen and pelvis was obtained without IV contrast from the lung bases through the pubic symphysis. Images were reconstructed in the axial plane. Coronal and sagittal reformatted images were also provided for further evaluation. FINDINGS: Evaluation of the solid organs and vasculature is limited in the absence of intravenous contrast. LINES AND TUBES: None. LOWER THORAX: Refer to concurrent CT chest report for pulmonary metastases. HEPATOBILIARY: No focal hepatic lesions. The gallbladder is present and otherwise unremarkable. No biliary dilatation.  SPLEEN: Unremarkable. PANCREAS: Unremarkable. ADRENALS: Unremarkable. KIDNEYS/URETERS: Bilateral perinephric stranding, increased when compared to prior. No hydronephrosis. BLADDER: Unremarkable. PELVIC/REPRODUCTIVE ORGANS: Enlarged, fibroid uterus. No adnexal mass. GI TRACT: No dilated loops of bowel. Unremarkable appendix. Mild rectal wall thickening may be secondary to prior radiation. PERITONEUM/RETROPERITONEUM AND MESENTERY: No free air or fluid. L2 presacral stranding is likely secondary to known osseous metastases and history of radiation. LYMPH NODES: No enlarged lymph nodes. VESSELS: Normal in caliber. BONES AND SOFT TISSUES: Partially imaged left breast mass, better evaluated on concurrent CT of the chest. Unchanged scattered lytic lesions in the visualized spine, pelvis, bilateral femurs.     Bilateral perinephric stranding is more prominent than on prior. This finding is non-specific, but recommend correlation with urinalysis to exclude infection. Redemonstrated diffuse osseous metastases, left breast mass, and lower lobe pulmonary metastases.     Ct Head Wo Contrast    Result Date: 03/06/2019 EXAM: Computed tomography, head or brain without contrast material. DATE: 03/06/2019 2:54 PM ACCESSION: [id number] UN DICTATED: 03/06/2019 3:12 PM INTERPRETATION LOCATION: [facility] CLINICAL INDICATION: 58 years old Female with somnolent  COMPARISON: CT head 08/19/2011 TECHNIQUE: Axial CT images of the head  from skull base to vertex without contrast. FINDINGS: Mild parenchymal volume loss, progressed since prior. No evidence of acute intracranial hemorrhage, extra-axial collections, or acute ischemic event. No midline shift. Basal cisterns are patent. Ventricles are normal in size and configuration. Proptosis bilaterally. No suspicious osseous lesions. There are calcifications involving the carotid siphons.     No acute appearing intracranial abnormality.    Ct Chest Wo Contrast    Result Date: 03/06/2019 EXAM: CT CHEST WO CONTRAST DATE: 03/06/2019 12:35 AM ACCESSION: [id number] UN DICTATED: 03/06/2019 1:33 AM INTERPRETATION LOCATION: [facility] CLINICAL INDICATION: 58 years old Female with SOB, c/f pulmonary nodules and malignant effusions.  COMPARISON: 03/05/2019 XR; 12/16/2018 CT CT TECHNIQUE: A helical CT scan was obtained without IV contrast from the thoracic inlet through the hemidiaphragms. Images were reconstructed in the axial plane.  Coronal and sagittal reformatted images of the chest were also provided for further evaluation of the lung parenchyma. FINDINGS: AIRWAYS, LUNGS, PLEURA: Clear central tracheobronchial tree.  No lung consolidation.  Moderate left pleural effusion and adjacent passive atelectasis. Bilateral pulmonary nodules have increased in size when compared to prior, for reference left upper lobe (2:34) measures 1.1 cm, previously 0.9 cm and right upper lobe (2:26) measures 1.1 cm, previously 0.8 cm. Additionally, there are scattered new subcentimeter nodules. MEDIASTINUM: Normal heart size. Trace pericardial effusion. Normal caliber thoracic aorta.  No mediastinal lymphadenopathy. IMAGED ABDOMEN: Refer to concurrent CT abdomen and pelvis report. SOFT TISSUES: There is a 3.9 x 3.9 cm mass in the left medial breast (2:64), previously 3.8 x 3.6 cm. Redemonstrated left axillary and subpectoral lymphadenopathy measuring up to 1.6 cm (2:50). BONES: Similar appearance of lytic osseous lesions throughout the axial and appendicular skeleton. Slight interval increase in size of left breast mass, consistent with biopsy proven malignancy. Interval increase in size and number of bilateral pulmonary nodules. New moderate left pleural effusion, which is likely malignant. Stable left axillary lymphadenopathy and diffuse osseous metastases. ATTENDING ADDENDUM BY DR. Margit Banda ON 03/06/2019 AT 5:21 AM: Increased soft tissue along the anterior pleural space at the level of the fourth rib (image 58, series 2) and nodularity along the left mediastinal pleura (image 36 and 53, series 2) compatible with metastasis. New moderate size left pleural effusion, likely malignant.     Mri Brain W Wo Contrast    Result Date: 03/08/2019  EXAM: Magnetic resonance imaging, brain, without and with contrast material. DATE: 03/07/2019 10:49 PM ACCESSION: [id number] UN DICTATED: 03/08/2019 12:08 AM INTERPRETATION LOCATION: [facility] CLINICAL INDICATION: 58 years old Female with AMS in pt with metastatic triple negative breast cancer, assess for metastases and leptomeningeal disease  COMPARISON: 03/06/2019 CT TECHNIQUE: Multiplanar, multisequence MR imaging of the brain was performed without and with I.V. contrast. FINDINGS:  There are scattered and confluent periventricular and subcortical T2/FLAIR hyperintense foci that are non-specific, but likely consistent with chronic microvascular ischemic disease. Ventricles are normal in size. There is no midline shift. No extra-axial fluid collection. No evidence of intracranial hemorrhage. No diffusion weighted signal abnormality to suggest acute infarct. Small right mastoid effusion. No mass. There is no abnormal enhancement.     No evidence of intracranial metastasis. Chronic small vessel ischemic changes.    ______________________________________________________________________  Discharge Instructions:     Activity Instructions     Activity as tolerated  Other Instructions Call MD for:  difficulty breathing, headache or visual disturbances      Call MD for:  persistent nausea or vomiting      Call MD for:  severe uncontrolled pain      Call MD for:  temperature >38.5 Celsius      Discharge instructions      It was a pleasure taking care of you!    You were admitted to [facility] for nausea, vomiting, and diarrhea. We did a number of imaging studies to evaluate for possible infection. All of these were normal. The nausea and diarrhea you were having likely was related to severe constipation as it resolved after you began having regular bowel movements. Going forward it is very important that you take the miralax and senna twice daily at home. You should have a bowel movement every day or every other day, ESPECIALLY while taking oxycodone which can be very constipating.     Regarding your blood pressure medicines, please stop taking hydralazine. Your blood pressures were well controlled in the hospital without this medicine. Your blood sugars were high while you were admitted to the hospital. We increased your insulin to 20 units twice daily. Please continue this at home. Please call your primary care doctor at [facility] [phone] to make an appointment for a hospital follow up appointment in the next week to follow up your blood pressure medication changes as well as the increased insulin dosage.     Follow-up Plan after discharge:  Issues related to your hospitalization: Make sure to take miralax daily to target a bowel movement every day so you don't get constipated.   Follow-up appointment for lab draws and with [facility] Oncology: 03/21/2019 @1030AM  for lab draws and @11AM  for an appointment  Oncology specific plans going forward: Continue capecitabine twice daily    Primary provider to be contacted as needed:   Dr. Archie Balboa at the [facility] communication center: [phone]. Please make sure you have a functioning thermometer at home.?? If you are feeling poorly, especially if you have chills, shaking, muscle aches or lightheadedness, measure your temperature. If it is more than 100.5 Farenheit, call the nurse triage line during daytime hours (Monday through Friday 8AM - 5PM: [phone]) or on nights and weekends, the on-call doctor by calling the hospital operator [phone]) and asking for the on-call adult oncologist. Alternatively, since fever after chemotherapy may be a medical emergency, you may proceed directly to your local emergency room. Inform your provider that you recently received chemotherapy. You may have blood drawn for blood cultures and receive IV antibiotics.    Following discharge from the hospital, if you develop or notice worsening of any symptoms such as nausea, vomiting, chest pain, shortness of breath, fevers, or chills, please return to the emergency department.??     If you develop these symptoms, or if you have trouble obtaining any of your medications you may call the [facility] Communication Center to speak with the triage team at [phone] if Monday through Friday 8am-5pm or call [phone] after hours.      For appointments & questions Monday through Friday 8 AM - 5 PM   please call [phone] or toll free [phone].    On Nights, Weekends and Holidays  Call [phone] and ask for the oncologist on call.    [facility]  [address]  [place], [place] [address]  [email or web address]  Follow Up instructions and Outpatient Referrals     Ambulatory referral to Home Health      Is this a [facility] referral?: No    Physician to follow patient's care: Referring Provider    Disciplines requested:  Nursing  Physical Therapy  Occupational Therapy  Medical Social Work       Nursing requested: Teaching/skilled observation and assessment What teaching is needed (new diagnosis? new medications?): Medication management and teaching.  Disease process, VS monitoring    Physical Therapy requested: Evaluate and treat    Occupational Therapy Requested: Evaluate and treat    Medical Social Work requested: Other (please enter in comments) Comment - [employer] needs    Requested start of care date: Routine (within 48 hours)    Call MD for:  difficulty breathing, headache or visual disturbances      Call MD for:  persistent nausea or vomiting      Call MD for:  severe uncontrolled pain      Call MD for:  temperature >38.5 Celsius      Discharge instructions      Referral to outpatient occupational therapy      Suggest Treatment: Evaluation with suggestions for treatment    Referral to outpatient physical therapy      Suggest Treatment: Evaluation with suggestions for treatment    Procedures: Stretching Exercises          Appointments which have been scheduled for you    Mar 21, 2019 10:30 AM  (Arrive by 10:15 AM)  LAB ONLY with HB LAB WATER 460  LAB PHLEB [facility] REGION) [address]  [facility] [place] [address]  [phone]      Mar 21, 2019 11:00 AM  (Arrive by 10:45 AM)  RETURN ACTIVE [place] with Corrine Tasia Catchings, AGNP  [facility] [place]  [facility] REGION) [address]  [facility] [place] [address]  [phone]      Mar 27, 2019  9:00 AM  (Arrive by 8:45 AM)  LAB ONLY with HB LAB WATER 460  LAB PHLEB [place] REGION) [address]  [place] [address]  [phone]      Mar 27, 2019 10:00 AM  (Arrive by 9:45 AM)  NURSE VISIT with ONCINF HMOB LABS  [facility] REGION) [address]  [place] [address]  [phone]      Apr 10, 2019 11:15 AM  RETURN  ONCOLOGY with Cherlynn Perches, MD [facility] ORTHOPAEDICS [facility] REGION) [address] [place] [address]  [phone]      Apr 17, 2019 12:15 PM  COVID PRE TEST with [facility] COVID PRE TEST [facility] PROVIDER  RESPIRATORY DIAGNOSTIC CENTER [facility] REGION) [address]  [place] [address]  [phone]      Apr 19, 2019  COLONOSCOPY, FLEXIBLE, PROXIMAL TO SPLENIC FLEXURE; DIAGNOSTIC, W/WO COLLECTION SPECIMEN BY BRUSH OR WASH with Leland Her, MD  MOB GI PERIOP WATER 460 [facility] REGION) [address]  [place] [address]  [phone]   Apr 29, 2019 11:00 AM  (Arrive by 10:45 AM)  RETURN  GENERAL with Neysa Hotter, MD  [place] NEPHROLOGY [facility] REGION) [address]  [place]  [place] [address]  [phone]  ______________________________________________________________________  Discharge Day Services:  BP 143/67  - Pulse 80  - Temp 37.4 ??C (Oral)  - Resp 18  - Ht 157.5 cm (5' 2)  - Wt 85.5 kg (188 lb 7.9 oz)  - SpO2 95%  - BMI 34.48 kg/m??   Pt seen on the day of discharge and determined appropriate for discharge.    Condition at Discharge: good    Length of Discharge: I spent greater than 30 mins in the discharge of this patient.

## 2019-03-15 DIAGNOSIS — C50919 Malignant neoplasm of unspecified site of unspecified female breast: Principal | ICD-10-CM

## 2019-03-15 DIAGNOSIS — C7951 Secondary malignant neoplasm of bone: Principal | ICD-10-CM

## 2019-03-15 MED ORDER — OXYCODONE 10 MG TABLET
ORAL_TABLET | Freq: Four times a day (QID) | ORAL | 0 refills | 8 days | Status: CP | PRN
Start: 2019-03-15 — End: ?

## 2019-03-15 NOTE — Unmapped (Signed)
Patient Alexandra Richards was contacted today regarding her appointment. Appointment scheduled and confirmed with patient.

## 2019-03-15 NOTE — Unmapped (Signed)
Refill sent today for oxycodone 10 mg #30 tablets until she is able to be seen by Dr. Archie Balboa on 12/28. Reviewed [place] Controlled Substance data base with last refill on 11/19. Will also refer to palliative car.

## 2019-03-15 NOTE — Unmapped (Signed)
Addended by: Carlos American A on: 03/15/2019 11:16 AM     Modules accepted: Orders

## 2019-03-16 NOTE — Unmapped (Signed)
Referral to Outpatient Oncology Palliative Care Clinic (OOPC)     03/15/19: NEW pt referral to Outpatient Palliative Care Clinic [facility]) from Alexandra American NP       Reason for referral: sx mng     Summary:   58 y/o with metastatic breast cancer with path fx of her left femur, s/p RT but still has a fair amount of residual pain that limits her ability to walk and often sit. Currently taking oxy 10 mg TID but unsure if effective for pain control as per patient. She has also struggled with N/V + constipation, recently referred her to GI for additional work-up of this as well.    Recent hospitalization when she had an episode in which she became suddenly unresponsive to sternal rub. Rapid response was called (BP and glucose were appropriate). Neuro called stat to bedside for evaluation of possible stroke. CT head was negative and patient eventually improved after Narcan administration (patient had not received any opioids at that point in the hospitalization). A very similar event occurred on HD2 without resolution after Narcan administration. Patient was stable on RA, placed on continuous pulse ox monitoring for close monitoring, patient came to a few hours later and stated she is a deep sleeper. MRI head was ordered to evaluate for possible leptomeningeal spread of her cancer which was negative.  UTOX showing only mariajuana.   ??  Patient persistently hyperglycemic    Plan:  Appointment request sent to [facility] scheduler for next available, new patient appointment.        [name] RN BSN OCN MS  RN Clinical Coordinator  [facility] Outpatient Oncology Palliative Care [facility])  [facility]  Providers: Dr. Doreatha Martin, Dr. Kathlynn Grate, Burtis Junes ANP, and Laurene Footman CPP    Pager: [phone]   Phone: [phone]  Office: [phone]

## 2019-03-18 NOTE — Unmapped (Unsigned)
MD told PT to stop taking medication until their appointment on 03/28/19.  Pt would like [place] to call her 1/4 to schedule refill.

## 2019-03-25 NOTE — Unmapped (Signed)
Hi Dr Archie Balboa,    Patient Alexandra Richards contacted the Communication Center to cancel their appointment for 03/25/19.  The appointment has been cancelled.    Cancellation Reason: Date and time mixed up.    Thank you,  Christell Faith  [facility] Communication Center   [phone]

## 2019-03-25 NOTE — Unmapped (Deleted)
Outpatient Breast Medical Oncology Evaluation    Referring Physician: [facility]    PCP: Alexandra Richards    Consulting Physicians: Surgical oncology: NA.  Radiation oncology: Alexandra Humphrey MD.    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  First line: PDL 1 negative.  Capecitabine.  Second line: Clinical trial or Taxol.    Genomics: STRATA: TP53, MSS, TMB???low.  HARMONY: Basal like.  Bone directed therapy: Xgeva  Radiation: 12/21/2018??? 01/02/2019: Palliative radiation to pelvis/proximal femur  -----------------------------------------------------------------------------------------------------  Assessment: Alexandra Richards is a 58 y.o. 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  Previously discussed that MBC is not curable with current technologies but is treatable.  Did not have distant biopsy and will likely recommend bone biopsy at some point. Baseline bone scan with uptake in spine, left hemisacrum + path fracture (received RT) and uptake in left rib, right pubic ramus, and bilateral femora.  There were no trials and first-line triple negative setting, PDL1 testing was negative, therefore she was started on first-line capecitabine. Given her baseline Cr/CKD will plan to start at 1500 mg BID for 14 days on and 7 days off. Of note for calculating CrCl: lean mass should be used rather then total body weight. Some would use Ideal BWT (50 kg for her) or use adjusted BWT (67 kg). Then for CrCl equation would use 140-60 (67)/72*Cr x .85     Unfortunately she was hospitalized 03/05/2019???03/11/2019 for N/V, diarrhea inability to tolerate p.o..  Was hypertensive 2/2 clonidine rebound.  CT A/P showed Marked stool burden.  ID work-up was negative.  Had severe constipation and aggressive bowel regimen instituted.  Episode of unresponsiveness requiring rapid response.  Improved with Narcan.  No narcotics given and no visitors. UTOX showing only mariajuana. CT and MRI brain negative.  CT chest showed increased left breast mass, pulmonary nodules moderate left pleural effusion stable left axillary adenopathy and diffuse osseous mets.  Increased pleural mets.  Given her GI toxicities I will plan to take her off capecitabine.  Furthermore recent imaging showed progressive disease albeit in approximately 1.41-month interval.  I will refer her to interventional pulmonary for management of the left pleural effusion.  She is relatively asymptomatic and saturating well on room air.  Currently there are no clinical trials on the second line.      Plan    1.  Metastatic left breast cancer  >Systemic therapy:  PDL1 negative. Began capecitabine on 01/12/2019.  DR d/t CKD - 1500 mg BID 14/7.  DC capecitabine.  >Palliative Radiation: 12/21/2018??? 01/02/2019: Palliative radiation to pelvis/proximal femur  >Genetic/Genomic: HARMONY and STRATA - no actionable mutations.   > Systemic imaging: 01/02/2019: Bone scan with several areas of bone lesions. 03/05/2019: CT chest: Increased size left breast mass.  Increased size/number of pulmonary nodules.  Pleural based metastases moderate left pleural effusion.  Stable Lt LAD, 1 month.  CT CAP: Diffuse osseous mets.  No focal hepatic lesions.  >Brain imaging: 03/07/2019: Brain MR no evidence of intracranial metastases.  >Bone Directed therapy: Dental clearance inpatient. Alexandra Richards on 10/15, continue q6w (to time with 3 week visits).  >Vasc Access: Will need port at some point.  >Tumor Markers: none elevated     Comorbidities  > DM: On insulin, hyperglycemia on recent admission. Increased NPH. BS have been 200 or less, no hypoglycemia, has f/u with PCP next month who manages insulin.  > HTN: difficult to manage and on  multiple meds.    > CKD: Cr today elevated at 2.14, Cr Cl <30 so unable to resume Xeloda. Will recheck next week. Referred to nephrology, has appointment at [facility] w/ Alexandra Richards on 11/16.   > HFpEF: Attention to fluid status. On lasix 40 mg daily.     Supportive care  Bone pain/Arthalgias: APAP/oxycodone.  Referred to palliative care.  Fatigue: Mild to moderate currently.  Neuropathy: Mild at baseline.  Has DM.  Constipation: As prior GI issues.  Refer to gastroenterology.  Appointment for colonoscopy in December due to inadequate prep.  Seen in gastroenterology clinic.  Nausea/vomiting/anorexia: Recently hospitalized be related.  Constipation.  Continue aggressive bowel movement.  Follow-up with gastroenterology.  Episode of AMS: Unclear etiology.  Initially improved with Narcan.  Second episode did not improve with Narcan.  Referred to palliative care.    Follow-up    -----------------------------------------------------------------------------------------------------  No chief complaint on file.      Interval History:  Presents for follow-up on Xeloda.  Here today accompanied by her son.  Recently hospitalized for nausea vomiting related to constipation.  Improved with antiemetic and laxatives.  Uncontrolled hypertension and diabetes (hyperglycemia) addressed.  An episode of AMS with brain imaging negative.  Systemic restaging showed progressive disease.    NV:    Constipation:    Pain management:    Review of Systems: A complete twelve systems review was obtained and is positive per the HPI but otherwise negative in detail. See MIMS #1170 where available.    Functional Status: ECOG PS 1???2.  Independent with some ADLs and all IADLs.  Ambulates with walker due to pain.  Cognitively intact.    History of the Present Illness: Alexandra Richards is a pleasant 58 y.o. female who  has a past medical history of Cancer (CMS-HCC), Diabetes mellitus (CMS-HCC), HLD (hyperlipidemia), Hypertension, Non-intractable vomiting with nausea (01/04/2019), and Renal disorder. She is seen in consultation at the request of Dr. Redmond Pulling [facility]* for evaluation of breast cancer.  See initial consultation note for full details. Briefly she has a history of refractory HTN, HLD, HFpEF, CKD, IDDM and had multiple ED visits/admissions. She developed right breast soreness and progressive pain pelvic and hip pain. Found to have a left medial breast mass. Biopsy confirmed high-grade carcinoma. ER/PR and HER-2 negative thus essentially triple negative breast cancer. Staging CT showed diffuse osseous mets with pathological fracture of the sacrum, centimeters and subcentimeter pulmonary nodules. No other visceral or liver mets.  She was seen by orthopedics who recommended nonoperative intervention.  She received 2 fractions of palliative radiation. No FH of breast or ovarian cancer.    Oncology History Overview Note   STRATA  TP53 p.R273H  [id number].[id number]>A  Estimated variant allele frequency: 47%  MSS  Microsatellite Stable  TMB - Low  Mutations per MB: 4  Confidence interval: 1 - 12      HARMONY  Prosigna PAM50 Breast Cancer Subtype Assay   Patient: Alexandra Richards   Sample used for analysis: left breast   Breast Cancer Intrinsic Subtype:  Basal-like      Malignant neoplasm of breast metastatic to bone (CMS-HCC)   12/16/2018 Initial Diagnosis    Malignant neoplasm of breast metastatic to bone (CMS-HCC)     Malignant neoplasm of lower-inner quadrant of left breast in female, estrogen receptor negative (CMS-HCC)   11/2018 Initial Diagnosis    Malignant neoplasm of lower-inner quadrant of left breast in female, estrogen receptor negative (CMS-HCC)  12/16/2018 Interval Scan(s)    CT Chest:   --3.8 cm left medial breast mass with associated soft tissue stranding, concerning for breast malignancy.  -- Prominent left axillary lymphadenopathy measuring up to 1.6 cm, concerning for nodal metastatic involvement.  -- Numerous bilateral pulmonary nodules throughout all lung lobes, measuring up to 1.0 cm, concerning for metastatic disease.  -- Numerous lytic osseous lesions throughout the axial and appendicular skeleton, consistent with osseous metastatic disease. CT A/P  --Numerous lytic lesions of the pelvis and proximal femurs, with pathologic fractures of the bilateral sacrum extending into the sacral foramina.     11/2018 -  Presenting Symptoms    Developed right breast soreness and progressive pain pelvic and hip pain.      12/18/2018 Biopsy    Left breast 3:00 core needle biopsy.  Grade 3 ER negative, PR negative, HER-2 negative by IHC invasive ductal carcinoma.     12/21/2018 - 01/02/2019 Radiation    Palliative radiation to 3000 cGy at 300 cGy/fraction for a total of 10 fractions.      01/10/2019 - 01/10/2019 Chemotherapy    OP BREAST ABRAXANE ATEZOLIZUMAB  atezolizumab 840 mg IV on days 1, 15, nab-PACLitaxel 100 mg/m2 IV on days 1, 8, 15, every 28 days until disease progression or DLT         Patient Active Problem List   Diagnosis   ??? Hyperglycemia   ??? Fatigue   ??? Type 2 diabetes mellitus, with long-term current use of insulin (CMS-HCC)   ??? Anemia   ??? Hyponatremia   ??? Bilateral leg edema   ??? Hypokalemia   ??? Hypomagnesemia   ??? Essential hypertension   ??? CKD (chronic kidney disease)   ??? Elevated TSH   ??? Hyperkalemia   ??? (HFpEF) heart failure with preserved ejection fraction (CMS-HCC)   ??? Hypoglycemia   ??? Weakness of both lower extremities   ??? AKI (acute kidney injury) (CMS-HCC)   ??? 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 Malignant neoplasm of lower-inner quadrant of left breast in female, estrogen receptor negative (CMS-HCC)   ??? Non-intractable vomiting with nausea   ??? Constipation   ??? Decreased appetite       Past Medical History:   Diagnosis Date   ??? Cancer (CMS-HCC)    ??? Diabetes mellitus (CMS-HCC)    ??? HLD (hyperlipidemia)    ??? Hypertension    ??? Non-intractable vomiting with nausea 01/04/2019   ??? Renal disorder        No past surgical history on file.    Gyn History:     Medications:  Current Outpatient Medications   Medication Sig Dispense Refill   ??? acetaminophen (TYLENOL) 325 MG tablet Take 650 mg by mouth every six (6) hours as needed.      ??? amLODIPine (NORVASC) 5 MG tablet Take 2 tablets (10 mg total) by mouth daily.     ??? atorvastatin (LIPITOR) 40 MG tablet Take 40 mg by mouth nightly.      ??? blood sugar diagnostic (ON CALL EXPRESS TEST STRIP) Strp Use to check blood sugar by Other route Four (4) times a day (before meals and nightly). 100 strip 0   ??? CALCIUM ORAL Take 1 tablet by mouth.     ??? capecitabine (XELODA) 500 MG tablet Take 3 tablets (1,500 mg total) by mouth Two (2) times a day . For 14 days on then 7 days off  84 tablet 1   ??? carvediloL (COREG) 25 MG tablet Take 1 tablet (25 mg total) by mouth Two (2) times a day. 60 tablet 0   ??? cholecalciferol, vitamin D3, 1,000 unit (25 mcg) tablet Take 2 tablets (2,000 Units total) by mouth daily. 60 tablet 11   ??? cloNIDine HCL (CATAPRES) 0.3 MG tablet Take 0.3 mg by mouth Two (2) times a day.      ??? furosemide (LASIX) 40 MG tablet Take 40 mg by mouth daily. Take additional 40 mg if weight gain >3 lb in 1 day or leg swelling     ??? insulin NPH-insulin regular, 70/30, (HUMULIN/NOVOLIN) 100 unit/mL (70-30) injection Inject 0.2 mL (20 Units total) under the skin Two (2) times a day (30 minutes before a meal). Can increase by 5 units if glucose is greater than 140. 12 mL 0   ??? isosorbide mononitrate (IMDUR) 60 MG 24 hr tablet Take 2 tablets (120 mg total) by mouth daily. 180 tablet 0   ??? lisinopriL (PRINIVIL,ZESTRIL) 20 MG tablet Take 20 mg by mouth daily.     ??? melatonin 5 mg Tab Take 5 mg by mouth nightly.     ??? mirtazapine (REMERON) 15 MG tablet Take 1 tablet (15 mg total) by mouth nightly. For sleep and appetite; 30 tablet 2   ??? ondansetron (ZOFRAN-ODT) 4 MG disintegrating tablet Take 1 tablet (4 mg total) by mouth every six (6) hours as needed for nausea. 30 tablet 0   ??? oxyCODONE (ROXICODONE) 10 mg immediate release tablet Take 1 tablet (10 mg total) by mouth every six (6) hours as needed for pain. 30 tablet 0   ??? polyethylene glycol (MIRALAX) 17 gram/dose powder Take 17 g by mouth Two (2) times a day. 3060 g 0   ??? promethazine (PHENERGAN) 25 MG suppository Insert 1 suppository (25 mg total) into the rectum every eight (8) hours as needed for nausea (vomiting) for up to 30 doses. 30 suppository 0   ??? senna (SENOKOT) 8.6 mg tablet Take 2 tablets by mouth Two (2) times a day. 120 tablet 2     No current facility-administered medications for this visit.        Allergies:  No Known Allergies    Family History: Cancer-related family history is not on file.    Social History:   Social History     Social History Narrative    Lives alone in [place].  Never married.  3 children (1 son and 2 daughters).  Son live in [place] and she plans to move to an apartment near him. 23 grandchildren.   Former smoker.  Half [employer] x5-10 years.  Quit in 29s.  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  Associates degree in computer programming and bachelors in hospitality/tourism.       Physical Examination:   Vital Signs: There were no vitals taken for this visit.  General: Healthy-appearing patient, mild distress 2/2 left hip pain  HEENT: PERRTLA, EOMI, OP clear  Cardiovascular: Normal S1 and S2. RRR. No audible murmurs.  Respiratory: Chest clear to percussion and auscultation.   Gastrointestinal: Abdomen soft without masses and tenderness, no hepatosplenomegaly or other masses.  Normal bowel sounds.  Musculoskeletal: No bony pain or tenderness. ++ pain overlying left hip/pelvis  Skin: No rash, ecchymoses or purpuric lesions noted. ++ Right third toe nail removed with granulation tissue present.  No concern for infection.  Breasts: Exam deferred today.  Neurologic:  Alert and oriented.  Grossly non-focal  Lymphatic: No cervical, axillary or supraclavicular adenopathy.   Extremity: No lower extremity edema or upper extremity lymphedema    DATA REVIEW:    Laboratory: I personally reviewed the pertinent laboratory data.    Radiology: I personally reviewed the pertinent imaging.    Pathology: I personally reviewed the pathology report.

## 2019-03-25 NOTE — Unmapped (Signed)
Hi,     Patient contacted the Communication Center regarding the following:    -Patient would like to reschedule their today appointment with Dr Archie Balboa.    Please contact patient at [phone].    Thanks in advance,    Christell Faith  [facility] Communication [facility]   [phone]

## 2019-03-27 DIAGNOSIS — C7951 Secondary malignant neoplasm of bone: Principal | ICD-10-CM

## 2019-03-27 DIAGNOSIS — C50919 Malignant neoplasm of unspecified site of unspecified female breast: Principal | ICD-10-CM

## 2019-03-27 MED ORDER — OXYCODONE 10 MG TABLET
ORAL_TABLET | Freq: Four times a day (QID) | ORAL | 0 refills | 8.00000 days | Status: CP | PRN
Start: 2019-03-27 — End: ?

## 2019-03-27 NOTE — Unmapped (Signed)
Hi,    Patient Mckensi Renee Dicke called requesting a medication refill for the following:    ??? Medication: oxycodone  ??? Dosage: 10 mg  ??? Days left of medication: 1  ??? [facility] [place]       The expected turnaround time is 3-4 business days       Check Indicates criteria has been reviewed and confirmed with the patient:    []  Preferred Name   []  DOB and/or MR#  []  Preferred Contact Method  []  Phone Number(s)   []  Preferred Pharmacy   []  MyChart     Thank you,  Kelli Hope  [place] [facility]  [phone]

## 2019-03-27 NOTE — Unmapped (Signed)
RN was looking for labs scheduled to be completed on 03/25/19 but appointment was cancelled. After review of patient's Xgeva therapy plan, RN noticed a change in the time interval between injections from 28 days to 42 days. Last injection given on 02/28/19. Next injection due on 04/11/19. Patient's notified of update and would like to come in on 04/12/19. Appointments have been updated. Dr. Archie Balboa and his team has been notified of change. Patient verbalized understanding and had no additional questions at this time.

## 2019-03-27 NOTE — Unmapped (Signed)
AOC Triage Note     Patient: Alexandra Richards     Reason for call:  follow up call    Time call returned: 1550     Phone Assessment: spoke with Luretha     Triage Recommendations: Informed patient that the [facility] [place].     Patient Response: grateful     Outstanding tasks: Care team notification no further actions needed      Patient Pharmacy has been verified and primary pharmacy has been marked as preferred

## 2019-03-27 NOTE — Unmapped (Signed)
AOC Triage Note     Patient: Alexandra Richards     Reason for call:  return call    Time call returned: 1515     Phone Assessment: Spoke with Alexandra Richards. She was not able to do her visit with Dr. Archie Balboa on 12/28 because she had the wrong date written down.  She will need a refill on her pain medication Roxicodone 10 mg, she only has one day left.     Triage Recommendations: Informed her that the refill was meant to be done when she had her appointment on 12/28. Will reach out to team to bridge the prescription to hold her until her palliative care Zoom visit with Dr. Thompson Grayer on 1/6.  Will call back with response.     Patient Response: grateful     Outstanding tasks: Follow up with patient.     Patient Pharmacy has been verified and primary pharmacy has been marked as preferred

## 2019-03-28 ENCOUNTER — Encounter: Admit: 2019-03-28 | Discharge: 2019-03-29 | Payer: MEDICAID

## 2019-03-28 NOTE — Unmapped (Signed)
Patient Alexandra Richards was contacted today regarding her 1.4 appointment. Appointment scheduled and confirmed with patient.

## 2019-04-01 ENCOUNTER — Ambulatory Visit
Admit: 2019-04-01 | Discharge: 2019-04-04 | Disposition: A | Discharge status: Home Health | Payer: MEDICAID | Attending: Geriatric Medicine | Admitting: Hematology & Oncology

## 2019-04-01 ENCOUNTER — Ambulatory Visit
Admit: 2019-04-01 | Discharge: 2019-04-04 | Disposition: A | Discharge status: Home Health | Payer: MEDICAID | Admitting: Hematology & Oncology

## 2019-04-01 ENCOUNTER — Other Ambulatory Visit
Admit: 2019-04-01 | Discharge: 2019-04-04 | Disposition: A | Discharge status: Home Health | Payer: MEDICAID | Admitting: Hematology & Oncology

## 2019-04-01 DIAGNOSIS — C50919 Malignant neoplasm of unspecified site of unspecified female breast: Secondary | ICD-10-CM

## 2019-04-01 DIAGNOSIS — K5900 Constipation, unspecified: Principal | ICD-10-CM

## 2019-04-01 DIAGNOSIS — R600 Localized edema: Principal | ICD-10-CM

## 2019-04-01 DIAGNOSIS — C7951 Secondary malignant neoplasm of bone: Principal | ICD-10-CM

## 2019-04-01 DIAGNOSIS — R0602 Shortness of breath: Principal | ICD-10-CM

## 2019-04-01 DIAGNOSIS — Z171 Estrogen receptor negative status [ER-]: Secondary | ICD-10-CM

## 2019-04-01 DIAGNOSIS — I5033 Acute on chronic diastolic (congestive) heart failure: Principal | ICD-10-CM

## 2019-04-01 DIAGNOSIS — C50312 Malignant neoplasm of lower-inner quadrant of left female breast: Principal | ICD-10-CM

## 2019-04-01 DIAGNOSIS — Z5111 Encounter for antineoplastic chemotherapy: Principal | ICD-10-CM

## 2019-04-01 LAB — CBC W/ AUTO DIFF
BASOPHILS ABSOLUTE COUNT: 0.1 10*9/L (ref 0.0–0.1)
BASOPHILS RELATIVE PERCENT: 0.5 %
EOSINOPHILS ABSOLUTE COUNT: 0.2 10*9/L (ref 0.0–0.4)
EOSINOPHILS RELATIVE PERCENT: 1.7 %
HEMATOCRIT: 29.9 % — ABNORMAL LOW (ref 36.0–46.0)
HEMOGLOBIN: 9.1 g/dL — ABNORMAL LOW (ref 12.0–16.0)
LYMPHOCYTES ABSOLUTE COUNT: 0.8 10*9/L — ABNORMAL LOW (ref 1.5–5.0)
LYMPHOCYTES RELATIVE PERCENT: 8.5 %
MEAN CORPUSCULAR HEMOGLOBIN CONC: 30.2 g/dL — ABNORMAL LOW (ref 31.0–37.0)
MEAN CORPUSCULAR HEMOGLOBIN: 28.5 pg (ref 26.0–34.0)
MEAN CORPUSCULAR VOLUME: 94.2 fL (ref 80.0–100.0)
MEAN PLATELET VOLUME: 8 fL (ref 7.0–10.0)
MONOCYTES RELATIVE PERCENT: 9.9 %
NEUTROPHILS ABSOLUTE COUNT: 7.1 10*9/L (ref 2.0–7.5)
NEUTROPHILS RELATIVE PERCENT: 77.2 %
PLATELET COUNT: 494 10*9/L — ABNORMAL HIGH (ref 150–440)
RED BLOOD CELL COUNT: 3.18 10*12/L — ABNORMAL LOW (ref 4.00–5.20)
RED CELL DISTRIBUTION WIDTH: 17.3 % — ABNORMAL HIGH (ref 12.0–15.0)
WBC ADJUSTED: 9.2 10*9/L (ref 4.5–11.0)

## 2019-04-01 LAB — URINALYSIS WITH CULTURE REFLEX
BLOOD UA: NEGATIVE
NITRITE UA: NEGATIVE
PH UA: 5 (ref 5.0–9.0)
RBC UA: 4 /HPF (ref <=4)
SPECIFIC GRAVITY UA: 1.012 (ref 1.003–1.030)
SQUAMOUS EPITHELIAL: 4 /HPF (ref 0–5)
UROBILINOGEN UA: 0.2
WBC UA: 2 /HPF (ref 0–5)

## 2019-04-01 LAB — COMPREHENSIVE METABOLIC PANEL
ALBUMIN: 3.3 g/dL — ABNORMAL LOW (ref 3.5–5.0)
ALKALINE PHOSPHATASE: 61 U/L (ref 38–126)
ALT (SGPT): 8 U/L (ref <35)
ANION GAP: 11 mmol/L (ref 7–15)
AST (SGOT): 20 U/L (ref 14–38)
BILIRUBIN TOTAL: 0.3 mg/dL (ref 0.0–1.2)
BLOOD UREA NITROGEN: 30 mg/dL — ABNORMAL HIGH (ref 7–21)
CALCIUM: 9 mg/dL (ref 8.5–10.2)
CHLORIDE: 99 mmol/L (ref 98–107)
CO2: 23 mmol/L (ref 22.0–30.0)
CREATININE: 2.12 mg/dL — ABNORMAL HIGH (ref 0.60–1.00)
EGFR CKD-EPI AA FEMALE: 29 mL/min/{1.73_m2} — ABNORMAL LOW (ref >=60)
EGFR CKD-EPI NON-AA FEMALE: 25 mL/min/{1.73_m2} — ABNORMAL LOW (ref >=60)
POTASSIUM: 5.4 mmol/L — ABNORMAL HIGH (ref 3.5–5.0)
PROTEIN TOTAL: 6.4 g/dL — ABNORMAL LOW (ref 6.5–8.3)
SODIUM: 133 mmol/L — ABNORMAL LOW (ref 135–145)

## 2019-04-01 LAB — BILIRUBIN TOTAL: Bilirubin:MCnc:Pt:Ser/Plas:Qn:: 0.3

## 2019-04-01 LAB — SMEAR REVIEW

## 2019-04-01 LAB — SQUAMOUS EPITHELIAL: Lab: 4

## 2019-04-01 LAB — MEAN CORPUSCULAR VOLUME: Erythrocyte mean corpuscular volume:EntVol:Pt:RBC:Qn:Automated count: 94.2

## 2019-04-01 LAB — MAGNESIUM: Magnesium:MCnc:Pt:Ser/Plas:Qn:: 1.9

## 2019-04-01 LAB — PRO-BNP: Natriuretic peptide.B prohormone N-Terminal:MCnc:Pt:Ser/Plas:Qn:: 408 — ABNORMAL HIGH

## 2019-04-01 LAB — SLIDE REVIEW

## 2019-04-01 NOTE — Unmapped (Signed)
1150 Pt received in NAD. PIV accessed and positive blood return noted prior to initiation of treatment. VS signs completed and NP into see pt. COVID swab completed by Theodis Shove, RN.  Pt in NAD.  1152 Chest X-ray ordered. Awaiting technician to complete. Awaiting Bed for pt admission. Pt in NAD.   1242 COVID test negative. Pt resting in NAD.  1350 Pt with complaints of lower back pain. Rated at 7/10. Liz Beach, NP paged for Oxycodone order.  1432 Pt given Oxycodone as prescribed by NP.   1445 Pt bed ready on BMT. Report called to RN for room 1107. Pt transportation notified pt ready for transportation to inpatient unit. Awaiting transport. Pt in NAD.  1505 Pt transported with stretcher per transport to BMT. Pt in NAD at time of transport

## 2019-04-01 NOTE — Unmapped (Signed)
Hematology/Oncology Brief Admission Communication Note    Alexandra Richards is a 59 y.o. 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      Reason for admission: She is being admitted today due to worsening shortness of breath likely related to pleural effusion versus heart failure and constipation.    Plan for infusion center:   If the patient has an active treatment plan, chemotherapy to be administered: yes  Infusions or supportive care ordered to be started in infusion: Chest x-ray, may require IV Lasix depending on chest x-ray.  Additional testing/orders to be carried out prior to admission: Possible thoracocentesis.    The preliminary plan  and goals of admission are:  Consult IP for possible thoracocentesis.  Needs to see GI as an inpatient given significant issues with constipation that preclude continuation of systemic therapy.    The primary outpatient oncologist for this patient is [name]  The nurse navigator for this patient is Modena Jansky  --------------------------------------------------------------------------------------------------------------------    Outpatient Breast Medical Oncology Evaluation    Referring Physician: [facility] S[facility], Pr*    PCP: Tyson Babinski SVC PROSPECT H    Consulting Physicians: Surgical oncology: NA.  Radiation oncology: Rayetta Humphrey MD.    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  First line: PDL 1 negative.  Capecitabine.  Second line: Proceed with Taxol.  Third line: Clinical trial vs Doxil    Genomics: STRATA: TP53, MSS, TMB???low.  HARMONY: Basal like.  Bone directed therapy: Xgeva  Radiation: 12/21/2018??? 01/02/2019: Palliative radiation to pelvis/proximal femur  -----------------------------------------------------------------------------------------------------  Assessment: Alexandra Richards is a 59 y.o. female from [facility] [place] with denovo stage 4 TNBC to bone and lung.  Previously discussed that MBC is not curable with current technologies but is treatable.  Did not have distant biopsy and will likely recommend bone biopsy at some point. Baseline bone scan with uptake in spine, left hemisacrum + path fracture (received RT) and uptake in left rib, right pubic ramus, and bilateral femora.  There were no trials and first-line triple negative setting, PDL1 testing was negative, therefore she was started on first-line capecitabine. Given her baseline Cr/CKD will plan to start at 1500 mg BID for 14 days on and 7 days off. Of note for calculating CrCl: lean mass should be used rather then total body weight. Some would use Ideal BWT (50 kg for her) or use adjusted BWT (67 kg). Then for CrCl equation would use 140-60 (67)/72*Cr x .85     Unfortunately she was hospitalized 03/05/2019???03/11/2019 for N/V, diarrhea inability to tolerate p.o. Was hypertensive 2/2 clonidine rebound. CT A/P showed Marked stool burden. ID work-up was negative. Had severe constipation and aggressive bowel regimen instituted. Episode of unresponsiveness requiring rapid response. Improved with Narcan. No narcotics reported given and no visitors. UTOX showing only mariajuana. CT head and MRI brain negative. CT chest showed increased left breast mass, pulmonary nodules, moderate left pleural effusion stable left axillary adenopathy and diffuse osseous mets.  Increased pleural mets. Given her GI toxicities I will plan to take her off capecitabine.  Furthermore recent imaging showed progressive disease albeit in approximately 1.56-month interval.  I will refer her to interventional pulmonary for management of the left pleural effusion.  She is relatively asymptomatic and saturating well on room air.  Currently there are no clinical trials on the second line.  Will consider switching her to weekly Taxol at this point.  Unfortunately she presents with significant shortness of breath and will need to be admitted for further evaluation.  I am most concerned that she has progression of the left pleural effusion which may be malignancy.  She could also have heart failure PE or other etiologies although doubt the latter.  Added on proBNP to labs.  Ordered chest x-ray.  Check UA.  To infusion for possible Lasix predicated on chest x-ray.  I will have her return within 1 week of discharge to consider weekly Taxol therapy.      Plan    1.  Metastatic left breast cancer  >Systemic therapy:  PDL1 negative. Began capecitabine on 01/12/2019.  DR d/t CKD - 1500 mg BID 14/7.  DC capecitabine.  >Palliative Radiation: 12/21/2018??? 01/02/2019: Palliative radiation to pelvis/proximal femur  >Genetic/Genomic: HARMONY and STRATA - no actionable mutations.   > Systemic imaging: 01/02/2019: Bone scan with several areas of bone lesions. 03/05/2019: CT chest: Increased size left breast mass.  Increased size/number of pulmonary nodules.  Pleural based metastases moderate left pleural effusion.  Stable Lt LAD, 1 month.  CT CAP: Diffuse osseous mets.  No focal hepatic lesions.  >Brain imaging: 03/07/2019: Brain MR no evidence of intracranial metastases.  >Bone Directed therapy: Dental clearance inpatient. [name] on 10/15, continue q6w (to time with 3 week visits).  >Vasc Access: Will need port at some point.  >Tumor Markers: none elevated     Comorbidities  > DM: On insulin, hyperglycemia on recent admission. Increased NPH. BS have been 200 or less, no hypoglycemia, has f/u with PCP next month who manages insulin.  > HTN: difficult to manage and on multiple meds.    > CKD: Cr today elevated at 2.14, Cr Cl <30 so unable to resume Xeloda. Will recheck next week. Referred to nephrology, has appointment at [facility] w/ Dr. Eulah Pont on 11/16.   > HFpEF: Attention to fluid status. On lasix 40 mg daily.     Supportive care  Bone pain/Arthalgias: APAP/oxycodone.  Referred to palliative care.  Fatigue: Mild to moderate currently.  Neuropathy: Mild at baseline.  Has DM.  Constipation: As prior GI issues.  Refer to gastroenterology.  Appointment for colonoscopy in December due to inadequate prep.  Seen in gastroenterology clinic.  She will be seen by GI as an inpatient.  Nausea/vomiting/anorexia: Recently hospitalized be related.  Constipation.  Continue aggressive bowel movement.  Follow-up with gastroenterology.  Episode of AMS: Unclear etiology.  Initially improved with Narcan.  Second episode did not improve with Narcan.  Referred to palliative care.  Shortness of breath: Likely pleural effusion versus heart failure.  proBNP.  Chest x-ray.  Admit.  IV consultation.    Follow-up  Return to clinic within 1 week of discharge.  -----------------------------------------------------------------------------------------------------  Chief Complaint   Patient presents with   ??? Pre-treatment Follow-up   ??? Breast Cancer       Interval History:  Presents for follow-up on Xeloda. Accompanied by her friend Berna Spare. They have known each other for 10 years. Missed her appointment on 01/07/2019 due to hospitalization and 01/23/2019 due to mixup with the time/date of the appointment. Recently hospitalized for nausea vomiting related to constipation.  Improved with antiemetic and laxatives.  Had uncontrolled hypertension and diabetes (hyperglycemia) addressed.  Had an episode of AMS with brain imaging negative.  There was some concern that this was related to narcotics but she had not received any.  She has since been referred to palliative care.  I called in  refills for oxycodone on 01/25/2019.  Unfortunately systemic restaging as inpatient showed progressive disease.    SOB: On arrival she reports feeling markedly short of breath.  She apparently called 911 over the weekend but was not brought to the emergency room.  Of note she was found to have a left pleural effusion last visit.  She reports dyspnea at rest and marked dyspnea with exertion.  She has orthopnea and some PND. She has been sleeping in a recliner.    NV: She reports mild nausea but no vomiting.    Constipation: She reports ongoing constipation which does not respond to senna and MiraLAX.  Her last bowel movement was Thursday, 03/28/2019.    Pain management: She has ongoing issues with chronic pain.  I recently refilled her narcotics.    Review of Systems: A complete twelve systems review was obtained and is positive per the HPI but otherwise negative in detail. See MIMS #1170 where available.    Functional Status: ECOG PS 1???2.  Independent with some ADLs and all IADLs.  Ambulates with walker due to pain.  Cognitively intact.    History of the Present Illness: Ibeth Renee Kobs is a pleasant 59 y.o. female who  has a past medical history of Cancer (CMS-HCC), Diabetes mellitus (CMS-HCC), HLD (hyperlipidemia), Hypertension, Non-intractable vomiting with nausea (01/04/2019), and Renal disorder. She is seen in consultation at the request of Dr. Redmond Pulling [facility]* for evaluation of breast cancer.  See initial consultation note for full details. Briefly she has a history of refractory HTN, HLD, HFpEF, CKD, IDDM and had multiple ED visits/admissions. She developed right breast soreness and progressive pain pelvic and hip pain. Found to have a left medial breast mass. Biopsy confirmed high-grade carcinoma. ER/PR and HER-2 negative thus essentially triple negative breast cancer. Staging CT showed diffuse osseous mets with pathological fracture of the sacrum, centimeters and subcentimeter pulmonary nodules. No other visceral or liver mets.  She was seen by orthopedics who recommended nonoperative intervention.  She received 2 fractions of palliative radiation. No FH of breast or ovarian cancer.    Oncology History Overview Note   STRATA  TP53 p.[id number]  [id number].[id number]>A  Estimated variant allele frequency: 47%  MSS  Microsatellite Stable  TMB - Low  Mutations per MB: 4  Confidence interval: 1 - 12      HARMONY  Pro[employer]a PAM50 Breast Cancer Subtype Assay   Patient: Blakelee Renee Hissong   Sample used for analysis: left breast   Breast Cancer Intrinsic Subtype:  Basal-like      Malignant neoplasm of breast metastatic to bone (CMS-HCC)   12/16/2018 Initial Diagnosis    Malignant neoplasm of breast metastatic to bone (CMS-HCC)     Malignant neoplasm of lower-inner quadrant of left breast in female, estrogen receptor negative (CMS-HCC)   11/2018 Initial Diagnosis    Malignant neoplasm of lower-inner quadrant of left breast in female, estrogen receptor negative (CMS-HCC)     12/16/2018 Interval Scan(s)    CT Chest:   --3.8 cm left medial breast mass with associated soft tissue stranding, concerning for breast malignancy.  -- Prominent left axillary lymphadenopathy measuring up to 1.6 cm, concerning for nodal metastatic involvement.  -- Numerous bilateral pulmonary nodules throughout all lung lobes, measuring up to 1.0 cm, concerning for metastatic disease.  -- Numerous lytic osseous lesions throughout the axial and appendicular skeleton, consistent with osseous metastatic disease.    CT A/P  --Numerous lytic lesions of the pelvis and proximal femurs, with pathologic fractures  of the bilateral sacrum extending into the sacral foramina.     11/2018 -  Presenting Symptoms    Developed right breast soreness and progressive pain pelvic and hip pain.      12/18/2018 Biopsy    Left breast 3:00 core needle biopsy.  Grade 3 ER negative, PR negative, HER-2 negative by IHC invasive ductal carcinoma.     12/21/2018 - 01/02/2019 Radiation    Palliative radiation to 3000 cGy at 300 cGy/fraction for a total of 10 fractions.      01/10/2019 - 01/10/2019 Chemotherapy    OP BREAST ABRAXANE ATEZOLIZUMAB  atezolizumab 840 mg IV on days 1, 15, nab-PACLitaxel 100 mg/m2 IV on days 1, 8, 15, every 28 days until disease progression or DLT         Patient Active Problem List   Diagnosis   ??? Hyperglycemia   ??? Fatigue   ??? Type 2 diabetes mellitus, with long-term current use of insulin (CMS-HCC)   ??? Anemia   ??? Hyponatremia   ??? Bilateral leg edema   ??? Hypokalemia   ??? Hypomagnesemia   ??? Essential hypertension   ??? CKD (chronic kidney disease)   ??? Elevated TSH   ??? Hyperkalemia   ??? (HFpEF) heart failure with preserved ejection fraction (CMS-HCC)   ??? Hypoglycemia   ??? Weakness of both lower extremities   ??? AKI (acute kidney injury) (CMS-HCC)   ??? 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 Malignant neoplasm of lower-inner quadrant of left breast in female, estrogen receptor negative (CMS-HCC)   ??? Non-intractable vomiting with nausea   ??? Constipation   ??? Decreased appetite       Past Medical History:   Diagnosis Date   ??? Cancer (CMS-HCC)    ??? Diabetes mellitus (CMS-HCC)    ??? HLD (hyperlipidemia)    ??? Hypertension    ??? Non-intractable vomiting with nausea 01/04/2019   ??? Renal disorder        No past surgical history on file.    Gyn History:     Medications:  Current Outpatient Medications   Medication Sig Dispense Refill   ??? acetaminophen (TYLENOL) 325 MG tablet Take 650 mg by mouth every six (6) hours as needed.      ??? amLODIPine (NORVASC) 5 MG tablet Take 2 tablets (10 mg total) by mouth daily.     ??? atorvastatin (LIPITOR) 40 MG tablet Take 40 mg by mouth nightly.      ??? blood sugar diagnostic (ON CALL EXPRESS TEST STRIP) Strp Use to check blood sugar by Other route Four (4) times a day (before meals and nightly). 100 strip 0   ??? CALCIUM ORAL Take 1 tablet by mouth.     ??? capecitabine (XELODA) 500 MG tablet Take 3 tablets (1,500 mg total) by mouth Two (2) times a day . For 14 days on then 7 days off 84 tablet 1   ??? carvediloL (COREG) 25 MG tablet Take 1 tablet (25 mg total) by mouth Two (2) times a day. 60 tablet 0   ??? cholecalciferol, vitamin D3, 1,000 unit (25 mcg) tablet Take 2 tablets (2,000 Units total) by mouth daily. 60 tablet 11   ??? cloNIDine HCL (CATAPRES) 0.3 MG tablet Take 0.3 mg by mouth Two (2) times a day. ??? furosemide (LASIX) 40 MG tablet Take 40 mg by mouth daily. Take additional 40 mg if weight gain >3 lb in 1  day or leg swelling     ??? insulin NPH-insulin regular, 70/30, (HUMULIN/NOVOLIN) 100 unit/mL (70-30) injection Inject 0.2 mL (20 Units total) under the skin Two (2) times a day (30 minutes before a meal). Can increase by 5 units if glucose is greater than 140. 12 mL 0   ??? isosorbide mononitrate (IMDUR) 60 MG 24 hr tablet Take 2 tablets (120 mg total) by mouth daily. 180 tablet 0   ??? lisinopriL (PRINIVIL,ZESTRIL) 20 MG tablet Take 20 mg by mouth daily.     ??? melatonin 5 mg Tab Take 5 mg by mouth nightly.     ??? mirtazapine (REMERON) 15 MG tablet Take 1 tablet (15 mg total) by mouth nightly. For sleep and appetite; 30 tablet 2   ??? ondansetron (ZOFRAN-ODT) 4 MG disintegrating tablet Take 1 tablet (4 mg total) by mouth every six (6) hours as needed for nausea. 30 tablet 0   ??? oxyCODONE (ROXICODONE) 10 mg immediate release tablet Take 1 tablet (10 mg total) by mouth every six (6) hours as needed for pain. 30 tablet 0   ??? polyethylene glycol (MIRALAX) 17 gram/dose powder Take 17 g by mouth Two (2) times a day. 3060 g 0   ??? promethazine (PHENERGAN) 25 MG suppository Insert 1 suppository (25 mg total) into the rectum every eight (8) hours as needed for nausea (vomiting) for up to 30 doses. 30 suppository 0   ??? senna (SENOKOT) 8.6 mg tablet Take 2 tablets by mouth Two (2) times a day. 120 tablet 2     No current facility-administered medications for this visit.        Allergies:  No Known Allergies    Family History: Cancer-related family history is not on file.    Social History:   Social History     Social History Narrative    Lives alone in [place].  Never married.  3 children (1 son and 2 daughters).  Son live in [place] and she plans to move to an apartment near him. 23 grandchildren.   Former smoker.  Half [employer] x5-10 years.  Quit in 59s.  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  Associates degree in computer programming and bachelors in hospitality/tourism.       Physical Examination:   Vital Signs: BP 145/71  - Pulse 85  - Temp 36.6 ??C (97.8 ??F) (Oral)  - Resp 18  - Wt 87.5 kg (192 lb 14.4 oz)  - SpO2 (!) 88%  - BMI 35.28 kg/m??   General: Healthy-appearing patient, mild distress 2/2 left hip pain  HEENT: PERRTLA, EOMI, OP clear  Cardiovascular: Normal S1 and S2. RRR. No audible murmurs.  Respiratory: Right: Normal exam.  Diminished air entry in the bases.  No audible crackles.  Left: Dullness throughout almost to the apices.  Diminished air entry.  Gastrointestinal: Abdomen soft without masses and tenderness, no hepatosplenomegaly or other masses.  Normal bowel sounds.  Musculoskeletal: No bony pain or tenderness. ++ pain overlying left hip/pelvis  Skin: No rash, ecchymoses or purpuric lesions noted. ++ Right third toe nail removed with granulation tissue present.  No concern for infection.  Breasts: Deferred for other clinical priorities.  Neurologic:  Alert and oriented. Grossly non-focal  Lymphatic: No cervical, axillary or supraclavicular adenopathy.   Extremity: Bilateral 2+ lower extremity pitting pedal edema.    DATA REVIEW:    Laboratory: I personally reviewed the pertinent laboratory data.    Radiology: I personally reviewed the pertinent imaging.  Pathology: I personally reviewed the pathology report.

## 2019-04-01 NOTE — Unmapped (Signed)
It was a pleasure to see you today in the Medical Oncology Clinic.  Please call our nurse navigator, Dan Maker, if you have any interval questions or concerns:     For appointments, please call [phone]     Nurse Navigator:   Modena Jansky, RN: phone: [phone]     Prescription refills: [phone]     FAX: [phone]     For emergencies, evenings or weekends, please call [phone] and ask for the oncology fellow on call.     Reasons to call emergency line may include:   Fever of 100.5 or greater   Nausea and/or vomiting not relieved with nausea medicine   Diarrhea or constipation not relieved with bowel regimen   Severe pain not relieved with usual pain regimen     Labs from today:  Lab Results   Component Value Date    WBC 9.2 04/01/2019    HGB 9.1 (L) 04/01/2019    HCT 29.9 (L) 04/01/2019    MCV 94.2 04/01/2019    PLT 494 (H) 04/01/2019       Lab Results   Component Value Date    NEUTROABS 7.1 04/01/2019         Chemistry        Component Value Date/Time    NA 133 (L) 04/01/2019 1010    NA 136 11/29/2013 1208    K 5.4 (H) 04/01/2019 1010    K 3.5 11/29/2013 1208    CL 99 04/01/2019 1010    CL 99 11/29/2013 1208    CO2 23.0 04/01/2019 1010    CO2 27 11/29/2013 1208    BUN 30 (H) 04/01/2019 1010    BUN 23 (H) 11/29/2013 1208    CREATININE 2.12 (H) 04/01/2019 1010    CREATININE 1.19 (H) 11/29/2013 1208    GLU 167 04/01/2019 1010        Component Value Date/Time    CALCIUM 9.0 04/01/2019 1010    CALCIUM 9.3 11/29/2013 1208    ALKPHOS 61 04/01/2019 1010    AST 20 04/01/2019 1010    ALT 8 04/01/2019 1010    BILITOT 0.3 04/01/2019 1010            Lab Results   Component Value Date    ALT 8 04/01/2019    AST 20 04/01/2019    ALKPHOS 61 04/01/2019    BILITOT 0.3 04/01/2019       Future Appointments   Date Time Provider Department Center   04/03/2019  3:30 PM Everlena Cooper, MD [facility]   04/04/2019  9:30 AM Earlie Counts, OT [facility]   04/04/2019 10:45 AM Berna Spare Friend, PT [facility]   04/10/2019 11:15 AM Cherlynn Perches, MD [facility]   04/12/2019  9:00 AM [facility] [address] [facility]   04/12/2019 10:00 AM ONCINF [facility]   04/17/2019 12:15 PM [facility] COVID PRE TEST ACC PROVIDER [facility]   04/29/2019 11:00 AM Neysa Hotter, MD [facility]   04/30/2019 12:40 PM Beverly Milch, MD [facility]

## 2019-04-01 NOTE — Unmapped (Signed)
Labs collected via #23 butterfly.  To next appt. Care by A[employer] Ram[facility] fund[facility].

## 2019-04-02 LAB — BASIC METABOLIC PANEL
BLOOD UREA NITROGEN: 34 mg/dL — ABNORMAL HIGH (ref 7–21)
BUN / CREAT RATIO: 15
CALCIUM: 8.4 mg/dL — ABNORMAL LOW (ref 8.5–10.2)
CHLORIDE: 100 mmol/L (ref 98–107)
CO2: 22 mmol/L (ref 22.0–30.0)
CREATININE: 2.2 mg/dL — ABNORMAL HIGH (ref 0.60–1.00)
EGFR CKD-EPI AA FEMALE: 27 mL/min/{1.73_m2} — ABNORMAL LOW (ref >=60)
EGFR CKD-EPI NON-AA FEMALE: 24 mL/min/{1.73_m2} — ABNORMAL LOW (ref >=60)
GLUCOSE RANDOM: 125 mg/dL (ref 70–179)
POTASSIUM: 5.7 mmol/L — ABNORMAL HIGH (ref 3.5–5.0)
SODIUM: 130 mmol/L — ABNORMAL LOW (ref 135–145)

## 2019-04-02 LAB — PHOSPHORUS: Phosphate:MCnc:Pt:Ser/Plas:Qn:: 5.3 — ABNORMAL HIGH

## 2019-04-02 LAB — CBC
HEMATOCRIT: 25.9 % — ABNORMAL LOW (ref 36.0–46.0)
MEAN CORPUSCULAR HEMOGLOBIN: 29.7 pg (ref 26.0–34.0)
MEAN CORPUSCULAR VOLUME: 95.9 fL (ref 80.0–100.0)
MEAN PLATELET VOLUME: 8 fL (ref 7.0–10.0)
PLATELET COUNT: 440 10*9/L (ref 150–440)
RED BLOOD CELL COUNT: 2.7 10*12/L — ABNORMAL LOW (ref 4.00–5.20)
RED CELL DISTRIBUTION WIDTH: 17.1 % — ABNORMAL HIGH (ref 12.0–15.0)
WBC ADJUSTED: 7.4 10*9/L (ref 4.5–11.0)

## 2019-04-02 LAB — RED CELL DISTRIBUTION WIDTH: Lab: 17.1 — ABNORMAL HIGH

## 2019-04-02 LAB — EGFR CKD-EPI AA FEMALE: Lab: 27 — ABNORMAL LOW

## 2019-04-02 LAB — MAGNESIUM: Magnesium:MCnc:Pt:Ser/Plas:Qn:: 1.9

## 2019-04-02 LAB — GLUCOSE FLUID: Lab: 112

## 2019-04-02 NOTE — Unmapped (Signed)
Vitals:    04/01/19 1151 04/01/19 1152 04/01/19 1358 04/01/19 1502   BP: 121/70  137/80 147/78   Pulse: 75  80 72   Resp: 20  18 20    Temp: 37 ??C (98.6 ??F)  37.1 ??C (98.8 ??F) 36.9 ??C (98.4 ??F)   TempSrc: Oral  Oral Oral   SpO2: 95% 98% 96% 98%   Weight:   83.7 kg (184 lb 8 oz)        Problem: Adult Inpatient Plan of Care  Goal: Plan of Care Review  Outcome: Ongoing - Unchanged  Goal: Patient-Specific Goal (Individualization)  Outcome: Ongoing - Unchanged  Goal: Absence of Hospital-Acquired Illness or Injury  Outcome: Ongoing - Unchanged  Goal: Optimal Comfort and Wellbeing  Outcome: Ongoing - Unchanged  Goal: Readiness for Transition of Care  Outcome: Ongoing - Unchanged  Goal: Rounds/Family Conference  Outcome: Ongoing - Unchanged     Problem: Fall Injury Risk  Goal: Absence of Fall and Fall-Related Injury  Outcome: Ongoing - Unchanged     Problem: Self-Care Deficit  Goal: Improved Ability to Complete Activities of Daily Living  Outcome: Ongoing - Unchanged     Problem: Skin Injury Risk Increased  Goal: Skin Health and Integrity  Outcome: Ongoing - Unchanged

## 2019-04-02 NOTE — Unmapped (Signed)
Indications: Left pleural effusion    Monitoring:  The patient was monitored continuously by heart rate, blood pressure, pulse oximetry, and EKG tracing.     Procedure Details:      After the risks benefits and alternatives of the procedure were thoroughly explained, informed consent was obtained including the risks of chest pain, cough, bleeding, infection, injury to the lung or orther organ and a pneumothorax requiring chest tube placement. Immediately prior to the procedure, the time out was executed including correct patient identification and agreement on the procedure to be performed.     Using ultrasound guidance a large,hypoechoic, simple LEFT pleural effusion was noted.  Images are saved on the Interventional Pulmonary ultrasound machine for future review as needed. The area was prepped with chlorhexadine and draped in sterile fashion.  Following this 8 mL of 1% lidocaine as injected subcutaneously to provide topical anesthesia then deep to the pleura.  A small incision was then made parallel and superior to the rib, the thoracentesis needle with catheter was inserted into the chest wall and advanced under constant aspiration.  Upon aspiration of pleural fluid, the catheter was advanced into the pleural space and the needle was removed.  The catheter was then connected to a drainange bag and the fluid was removed under manual drainage.  The procedure was completed with cessation of fluid flow The catheter was then removed during slow forced exhalation and bandaged.     Findings:  2100 mL of serosanguinous pleural fluid was removed. The procedure was terminated due to cessation of fluid flow. The fluid was sent for hematocrit, cytology, glucose, protein, LDH, Cell count and differential, gram stain and culture, albumin or cholesterol.     Estimate Blood Loss:  None    Complications: None         Plan: A follow up chest x-ray is ordered     Cleora Fleet, MD  Interventional Pulmonology

## 2019-04-02 NOTE — Unmapped (Signed)
C/o HA and back pain, PRN oxycodone administered, awaiting results. Resting well this shift. Reports feeling that her breathing is better. VSS. No falls this shift. Will ctm.  Problem: Adult Inpatient Plan of Care  Goal: Plan of Care Review  Outcome: Ongoing - Unchanged  Goal: Patient-Specific Goal (Individualization)  Outcome: Ongoing - Unchanged  Goal: Absence of Hospital-Acquired Illness or Injury  Outcome: Ongoing - Unchanged  Goal: Optimal Comfort and Wellbeing  Outcome: Ongoing - Unchanged  Goal: Readiness for Transition of Care  Outcome: Ongoing - Unchanged  Goal: Rounds/Family Conference  Outcome: Ongoing - Unchanged

## 2019-04-02 NOTE — Unmapped (Signed)
INTERVENTIONAL PULMONOLOGY INITIAL CONSULT NOTE    Assessment:     Alexandra Richards is a 59 year old African American female with a past medical history of metastatic breast cancer who on recent cross-sectional imaging was found to have a moderate sized left pleural effusion along with nodularity of the left mediastinal pleura, thought to be compatible with metastasis. She was seen by Dr. Archie Balboa in clinic yesterday with a new compliant of dyspnea, that her worsened over the past week. She was initially started on Capecitabine in October 2020, which was dose reduced due to underlying chronic kidney disease.     Her current pleural effusion could stem from her underlying breast cancer. A cytologic assessment was obtained earlier today. Her serum-pleural albumin gradient is 1.4, with recent outpatient Lasix use. If this was a pseudoexudative process would have expected her serum-pleural albumin gradient to be less than 1.2.     Plan:     1. S/p left sided thoracentesis. Please see dedicated procedure note for further details     2. Pleural fluid studies are consistent with an exudative, lymphocytic effusion. Cytologic assessment is pending     3. I discussed insertion of an indwelling pleural catheter if cytology reveals that this is a malignant pleural effusion, which she is amenable to having placed. Her current LENT score is a 4. She wants to further assess how quickly her symptoms recur, which is reasonable     This patient was seen and evaluated with Dr. Maple Mirza     Please call the Interventional Pulmonary pager at [phone] with any questions.    History:     Requesting Physician and Service: Med E    Reason for Consult: Left pleural effusion     HPI:   Alexandra Richards is a 59 year old African American female with a past medical history of resistant hypertension, chronic kidney disease, heart failure with a preserved ejection fraction (grade II diastolic dysfunction), and metastatic breast cancer. She states that she became acutely dyspneic last week, necessitating a 911 call this past weekend. Despite the above, she was not brought to the emergency room for further evaluation. Her dyspnea has worsened to the point that it is present at rest. Associated with this she has had increasing orthopnea and been sleeping in a recliner. She denies fevers, chills, pleuritic chest pain, and has not been around any known sick contacts. She has recurrently been admitted in the past with hypertensive emergency secondary to Clonidine noncompliance, hyperglycemia, and constipation leading to nausea/vomiting and anorexia. Upon my visit prior to her waking up, the bed was at a 30 degree angle, and she was comfortably resting as I entered the room. She has never previously required a thoracentesis.     Past Medical History:   Diagnosis Date   ??? Cancer (CMS-HCC)    ??? Diabetes mellitus (CMS-HCC)    ??? HLD (hyperlipidemia)    ??? Hypertension    ??? Non-intractable vomiting with nausea 01/04/2019   ??? Renal disorder      No past surgical history on file.    Current Facility-Administered Medications   Medication Dose Route Frequency Provider Last Rate Last Dose   ??? acetaminophen (TYLENOL) tablet 650 mg  650 mg Oral Q6H PRN Max Jeri Cos, MD       ??? amLODIPine (NORVASC) tablet 10 mg  10 mg Oral Daily Unknown Foley, MD   10 mg at 04/01/19 1548   ??? atorvastatin (LIPITOR) tablet 40 mg  40 mg  Oral Nightly Unknown Foley, MD   40 mg at 04/01/19 2114   ??? carvediloL (COREG) tablet 25 mg  25 mg Oral BID Unknown Foley, MD   25 mg at 04/01/19 2114   ??? cholecalciferol (vitamin D3) tablet 2,000 Units  2,000 Units Oral Daily Unknown Foley, MD   2,000 Units at 04/01/19 1548   ??? cloNIDine HCL (CATAPRES) tablet 0.3 mg  0.3 mg Oral BID Max F Hockenbury, MD   0.3 mg at 04/01/19 2113   ??? dextrose 50 % in water (D50W) 50 % solution 12.5 g  12.5 g Intravenous Q10 Min PRN Max F Heide Scales, MD       ??? heparin (porcine) injection 5,000 Units  5,000 Units Subcutaneous Q8H [facility] Unknown Foley, MD   5,000 Units at 04/02/19 6045   ??? insulin lispro (HumaLOG) injection 0-12 Units  0-12 Units Subcutaneous ACHS Unknown Foley, MD   2 Units at 04/01/19 2112   ??? insulin NPH-insulin regular (70/30) (HumuLIN/NovoLIN) injection 20 Units  20 Units Subcutaneous BID AC Unknown Foley, MD   20 Units at 04/01/19 1802   ??? ipratropium-albuteroL (DUO-NEB) 0.5-2.5 mg/3 mL nebulizer solution 3 mL  3 mL Nebulization Q6H PRN Unknown Foley, MD   3 mL at 04/01/19 1720   ??? isosorbide mononitrate (IMDUR) 24 hr tablet 120 mg  120 mg Oral Daily Max F Hockenbury, MD       ??? magnesium citrate solution 296 mL  296 mL Oral Once Napoleon Form, MD       ??? melatonin tablet 4.5 mg  4.5 mg Oral Nightly Max F Hockenbury, MD   4.5 mg at 04/01/19 2113   ??? mirtazapine (REMERON) tablet 15 mg  15 mg Oral Nightly Max Jeri Cos, MD   15 mg at 04/01/19 2114   ??? ondansetron (ZOFRAN-ODT) disintegrating tablet 4 mg  4 mg Oral Q6H PRN Max Jeri Cos, MD       ??? oxyCODONE (ROXICODONE) immediate release tablet 10 mg  10 mg Oral Q6H PRN Unknown Foley, MD   10 mg at 04/02/19 0414   ??? pneumococcal polysacchride (23-valps) (PNEUMOVAX) vaccine 0.5 mL  0.5 mL Subcutaneous During hospitalization Stergios Moschos, MD       ??? polyethylene glycol (MIRALAX) packet 17 g  17 g Oral Daily Unknown Foley, MD   17 g at 04/01/19 1930   ??? promethazine (PHENERGAN) suppository 25 mg  25 mg Rectal Q8H PRN Max Jeri Cos, MD       ??? senna (SENOKOT) tablet 2 tablet  2 tablet Oral BID Unknown Foley, MD   2 tablet at 04/01/19 2112       Allergies as of 04/01/2019   ??? (No Known Allergies)       No family history on file.    Social History     Socioeconomic History   ??? Marital status: Single     Spouse name: Not on file   ??? Number of children: 3   ??? Years of education: Not on file   ??? Highest education level: Not on file   Occupational History   ??? Not on file   Social Needs   ??? Financial resource strain: Not on file   ??? Food insecurity Worry: Patient refused     Inability: Patient refused   ??? Transportation needs     Medical: No     Non-medical: No   Tobacco Use   ??? Smoking status:  Former Smoker     Types: Cigarettes   ??? Smokeless tobacco: Never Used   Substance and Sexual Activity   ??? Alcohol use: No     Alcohol/week: 0.0 standard drinks   ??? Drug use: Yes     Types: Marijuana     Comment: Last use in April 2020   ??? Sexual activity: Not Currently   Lifestyle   ??? Physical activity     Days per week: Not on file     Minutes per session: Not on file   ??? Stress: Not on file   Relationships   ??? Social Wellsite geologist on phone: Not on file     Gets together: Not on file     Attends religious service: Not on file     Active member of club or organization: Not on file     Attends meetings of clubs or organizations: Not on file     Relationship status: Not on file   Other Topics Concern   ??? Not on file   Social History Narrative    Lives alone in [place].  Never married.  3 children (1 son and 2 daughters).  Son live in [place] and she plans to move to an apartment near him. 23 grandchildren.   Former smoker.  Half [employer] x5-10 years.  Quit in 53s.  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  Associates degree in computer programming and bachelors in hospitality/tourism.     Review of Systems  A 12 point review of systems was negative except for pertinent items noted in the HPI.    Objective:     Physical Examination:   BP 139/76  - Pulse 79  - Temp 37.1 ??C (98.7 ??F) (Oral)  - Resp 20  - Wt 83.7 kg (184 lb 8 oz)  - SpO2 97%  - BMI 33.75 kg/m??     General appearance - alert, well appearing, and in no distress    Eyes - Sclera anicteric, conjunctiva pink  Mouth - mucous membranes moist, pharynx normal without lesions  Neck - Trachea supple and midline, (-) JVD   Lymphatics - no palpable lymphadenopathy  Heart - normal rate, regular rhythm, normal S1, S2, no murmurs, rubs, clicks or gallops  Chest - Diminished breath sounds on the left. Diffuse expiratory wheezing. No rales/rhonchi   Abdomen - soft, nontender, nondistended, no masses or organomegaly  Extremities - No pedal edema, no clubbing or cyanosis  Skin - normal coloration and turgor, no rashes, no suspicious skin lesions noted  Neurological - alert, oriented, normal speech, no focal findings or movement disorder noted    Labs and Imaging:  LDH, Fluid (04/02/19): 868  Protein, Fluid (04/02/19): 4.1   Total Protein, Serum (04/01/19): 6.4  CBC (04/02/19): 7.4>8/25.9<440  BUN/sCr (04/02/19): 34/2.20   Hematocrit, Fluid (04/02/19): 2     Cleora Fleet, MD  Interventional Pulmonology

## 2019-04-02 NOTE — Unmapped (Signed)
Spiritual Care Consult Note    Assessment Summary: Chaplain attempted initial visit this afternoon for support but pt was asleep and resting comfortably in bed. Chaplain will return tomorrow for visit. Please notify if needed sooner.    Clinical Encounter Type  Care Provided To: Patient not available  Referral Source: Physician  Type of Visit: Attempt (Patient unavailable)  On-Call Visit?: No  Reason for Visit: Routine spiritual support    Rev. Vilma Meckel, MDIV, [facility]  Oncology Chaplain  [phone]

## 2019-04-02 NOTE — Unmapped (Signed)
Oncology (MDE2) Progress Note    Subjective/24 hr events:    No acute events overnight. Complains of difficulty sleeping. IP performed thoracentesis this AM without complications, ~2L removed. Will f/u diagnostic studies.    Assessment & Plan:   Alexandra Richards is a 59 y.o. 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    Principal Problem:    Hypoxia  Active Problems:    Anemia    (HFpEF) heart failure with preserved ejection fraction (CMS-HCC)    AKI (acute kidney injury) (CMS-HCC)    Constipation    Malignant pleural effusion  Resolved Problems:    * No resolved hospital problems. *    Acute hypoxemic respiratory failure: Patient has complete white out of her left lung likely secondary to progressive malignant pleural effusion.  She is requiring 2 L supplemental nasal cannula, not on oxygen at home.  Interventional pulmonology has been consulted.    - IP performed therapeutic and diagnostic thoracentesis today w/ 2L fluid removed, will f/u diagnostic studies (hematocrit, cytology, glucose, protein, LDH, Cell count and differential, gram stain and culture, albumin or cholesterol)  - Appreciate IP's recommendations  ??  Constipation: Patient has not had a bowel movement in ~4 days.  - Miralax daily, 2 tabs senna Triple negative, PD-L1 negative metastatic breast cancer to bone & lung: He received radiation therapy between 12/21/18-01/02/19. Bone scan with several areas of bone lesions. 03/05/2019: CT chest: Increased size left breast mass. ??Increased size/number of pulmonary nodules. ??Pleural based metastases moderate left pleural effusion. Brain MR no evidence of intracranial metastases. ??Stable Lt LAD, 1 month. ??CT CAP: Diffuse osseous mets. ??No focal hepatic lesions.  Including palliative radiation to the pelvis and proximal femur.  Otherwise she was started on capecitabine however this was discontinued in the setting of GI toxicities, specifically constipation.   - Follows w/ Dr. Archie Balboa, notified of admission.  - Not currently on chemotherapy  - Hold capecitabine  ??  AKI on CKD: Cr slightly elevated at 2.2 today, bl ~1.5-2.0.   - hold home lisinopril, lasix    T2DM  - NPH 20u bid  - SSI  ??  HTN:   - continue home regimen: amlodipine 10 mg, Coreg 25 mg bid, Imdur 120 mg daily   - hold home lisinopril in the setting of her AKI  ????  HFpEF  Holding Lasix in the setting of AKI, however closely monitor volume status as she may require diuresis.     Sleep disorder  - increased melatonin to 9 mg nightly  - continue home Remeron     Daily Checklist:  Diet: Regular Diet  DVT PPx: Heparin 5000units q8h  Electrolytes: Potassium Repleted  Code Status: Full Code  Dispo: Home    Objective:   Temp:  [36.9 ??C-37.1 ??C] 36.9 ??C  Heart Rate:  [70-85] 70  Resp:  [18-24] 20  BP: (128-151)/(71-83) 144/71  SpO2:  [94 %-100 %] 98 %    Gen: elderly female in NAD, alert, lying in bed  HEENT: atraumatic, sclera anicteric, MMM  Heart: RRR, S1, S2, no M/R/G, no chest wall tenderness  Lungs: Diminished lung sounds on left side, no crackles or wheezes, no use of accessory muscles  Abdomen: Normoactive bowel sounds, soft, NTND, no rebound/guarding  Extremities: no clubbing, cyanosis, or edema  Psych: Appropriate mood and affect Labs/Studies:  Labs and Studies from the last 24hrs per EMR and Reviewed

## 2019-04-02 NOTE — Unmapped (Signed)
Pt alert /fatigued, afebrile & stable. Pt on 2L 02 [place]. Pt had expiratory wheezes, gave her a duoneb and that helped her feel more comfortable. Pending pulmonary consult/thoracentesis. No other acute/significant changes. WCTM.     Vitals:    04/01/19 1151 04/01/19 1152 04/01/19 1358 04/01/19 1502   BP: 121/70  137/80 147/78   Pulse: 75  80 72   Resp: 20  18 20    Temp: 37 ??C (98.6 ??F)  37.1 ??C (98.8 ??F) 36.9 ??C (98.4 ??F)   TempSrc: Oral  Oral Oral   SpO2: 95% 98% 96% 98%   Weight:   83.7 kg (184 lb 8 oz)        Problem: Adult Inpatient Plan of Care  Goal: Plan of Care Review  04/01/2019 1938 by Pincus Large, RN  Outcome: Progressing  04/01/2019 1625 by Pincus Large, RN  Outcome: Ongoing - Unchanged  Goal: Patient-Specific Goal (Individualization)  04/01/2019 1938 by Pincus Large, RN  Outcome: Progressing  04/01/2019 1625 by Pincus Large, RN  Outcome: Ongoing - Unchanged  Goal: Absence of Hospital-Acquired Illness or Injury  04/01/2019 1938 by Pincus Large, RN  Outcome: Progressing  04/01/2019 1625 by Pincus Large, RN  Outcome: Ongoing - Unchanged  Goal: Optimal Comfort and Wellbeing  04/01/2019 1938 by Pincus Large, RN  Outcome: Progressing  04/01/2019 1625 by Pincus Large, RN  Outcome: Ongoing - Unchanged  Goal: Readiness for Transition of Care  04/01/2019 1938 by Pincus Large, RN  Outcome: Progressing  04/01/2019 1625 by Pincus Large, RN  Outcome: Ongoing - Unchanged  Goal: Rounds/Family Conference  04/01/2019 1938 by Pincus Large, RN  Outcome: Progressing  04/01/2019 1625 by Pincus Large, RN  Outcome: Ongoing - Unchanged     Problem: Fall Injury Risk  Goal: Absence of Fall and Fall-Related Injury  04/01/2019 1938 by Pincus Large, RN  Outcome: Progressing  04/01/2019 1625 by Pincus Large, RN  Outcome: Ongoing - Unchanged     Problem: Self-Care Deficit  Goal: Improved Ability to Complete Activities of Daily Living  04/01/2019 1938 by Pincus Large, RN  Outcome: Progressing 04/01/2019 1625 by Pincus Large, RN  Outcome: Ongoing - Unchanged     Problem: Skin Injury Risk Increased  Goal: Skin Health and Integrity  04/01/2019 1938 by Pincus Large, RN  Outcome: Progressing  04/01/2019 1625 by Pincus Large, RN  Outcome: Ongoing - Unchanged

## 2019-04-02 NOTE — Unmapped (Signed)
Medicine History and Physical    Assessment/Plan:    Principal Problem:    Hypoxia  Active Problems:    Anemia    (HFpEF) heart failure with preserved ejection fraction (CMS-HCC)    AKI (acute kidney injury) (CMS-HCC)    Constipation    Malignant pleural effusion  Resolved Problems:    * No resolved hospital problems. *      Kiyani Jernigan is a 59 y.o. 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     Acute advice of respiratory failure: Patient has complete white out of her left lung likely secondary to progressive malignant pleural effusion.  She is requiring 2 L supplemental nasal cannula.  Interventional pulmonology has been consulted.  Plan for therapeutic and diagnostic thoracentesis.  -Appreciate IP's recommendations    Constipation: Patient has not had a bowel movement in 4 days.  Will restart MiraLAX, senna.    Triple negative, PD-L1 negative metastatic breast cancer to bone and lung: He received radiation therapy between 12/20/2020-01/02/2019. Bone scan with several areas of bone lesions. 03/05/2019: CT chest: Increased size left breast mass.  Increased size/number of pulmonary nodules.  Pleural based metastases moderate left pleural effusion. Brain MR no evidence of intracranial metastases.  Stable Lt LAD, 1 month.  CT CAP: Diffuse osseous mets.  No focal hepatic lesions.  Including palliative radiation to the pelvis and proximal femur.  Otherwise she was started on capecitabine however this was discontinued in the setting of GI toxicities.  Specifically constipation.  -Not currently on chemotherapy, notify Dr. Archie Balboa on admission    T2DM: NPH 20 units twice daily plus sliding scale    HTN: Currently on amlodipine 10 mg, Coreg 25 mg twice daily, Imdur 120 mg daily.  Continue home meds with the exception of lisinopril in the setting of her AKI. CKD: Cr today elevated at 2.14,   -Lisinopril, Lasix    HFpEF: Holding Lasix in the setting of AKI, however closely monitor volume status as she may require diuresis. y.      ___________________________________________________________________    Chief Complaint:  Chief Complaint   Patient presents with   ??? Acute Illness     Hypoxia    HPI:  Alexandra Richards is a 59 y.o. female with PMHx as reviewed in the EMR that presented to [facility] with Hypoxia.    Patient was recently admitted to the E2 service for constipation, nausea vomiting within the last month.  She was cleared out and discharged home.  At the time she was noted to have a moderately sized left-sided pleural effusion.  Over the last week patient reports increasing shortness of breath, dyspnea on exertion, orthopnea, and wheezing.  Additionally last week she had loose stools several days in a row and so decided to stop her MiraLAX/senna.  She has had therefore not had a bowel movement in 4 days.    She then presented to outpatient oncology clinic for routine follow-up and was found to be hypoxic.  O2 sats 85%, started on 3 L nasal cannula.  Otherwise normotensive, afebrile, normal heart rate.  Labs notable for stable hemoglobin 9.1, sodium 133, calcium 5.4, creatinine 2.12 up from baseline of 1.5.  Chest x-ray showed increasing evolution of her pleural effusion with complete white out of her left lung.  Covid negative.  She was then admitted from infusion  clinic.    Oncology History Overview Note   STRATA  TP53 p.[id number]  [id number]  Estimated variant allele frequency: 47%  MSS  Microsatellite Stable  TMB - Low  Mutations per MB: 4  Confidence interval: 1 - 12      HARMONY  Prosigna PAM50 Breast Cancer Subtype Assay   Patient: Alexandra Richards   Sample used for analysis: left breast   Breast Cancer Intrinsic Subtype:  Basal-like      Malignant neoplasm of breast metastatic to bone (CMS-HCC)   12/16/2018 Initial Diagnosis Malignant neoplasm of breast metastatic to bone (CMS-HCC)     Malignant neoplasm of lower-inner quadrant of left breast in female, estrogen receptor negative (CMS-HCC)   11/2018 Initial Diagnosis    Malignant neoplasm of lower-inner quadrant of left breast in female, estrogen receptor negative (CMS-HCC)     12/16/2018 Interval Scan(s)    CT Chest:   --3.8 cm left medial breast mass with associated soft tissue stranding, concerning for breast malignancy.  -- Prominent left axillary lymphadenopathy measuring up to 1.6 cm, concerning for nodal metastatic involvement.  -- Numerous bilateral pulmonary nodules throughout all lung lobes, measuring up to 1.0 cm, concerning for metastatic disease.  -- Numerous lytic osseous lesions throughout the axial and appendicular skeleton, consistent with osseous metastatic disease.    CT A/P  --Numerous lytic lesions of the pelvis and proximal femurs, with pathologic fractures of the bilateral sacrum extending into the sacral foramina.     11/2018 -  Presenting Symptoms    Developed right breast soreness and progressive pain pelvic and hip pain.      12/18/2018 Biopsy    Left breast 3:00 core needle biopsy.  Grade 3 ER negative, PR negative, HER-2 negative by IHC invasive ductal carcinoma.     12/21/2018 - 01/02/2019 Radiation    Palliative radiation to 3000 cGy at 300 cGy/fraction for a total of 10 fractions.      01/10/2019 - 01/10/2019 Chemotherapy    OP BREAST ABRAXANE ATEZOLIZUMAB  atezolizumab 840 mg IV on days 1, 15, nab-PACLitaxel 100 mg/m2 IV on days 1, 8, 15, every 28 days until disease progression or DLT     04/08/2019 -  Chemotherapy    OP BREAST PACLITAXEL (METASTATIC EVERY 28 DAYS)  PACLIitaxel 80 mg/m2 IV on days 1, 8, 15 every 28 days           Allergies:  Patient has no known allergies.    Medications:   Prior to Admission medications    Medication Dose, Route, Frequency   acetaminophen (TYLENOL) 325 MG tablet 650 mg, Oral, Every 6 hours PRN amLODIPine (NORVASC) 5 MG tablet 10 mg, Oral, Daily (standard)   atorvastatin (LIPITOR) 40 MG tablet 40 mg, Oral, Nightly   blood sugar diagnostic (ON CALL EXPRESS TEST STRIP) Strp Use to check blood sugar by Other route Four (4) times a day (before meals and nightly).   CALCIUM ORAL 1 tablet, Oral   capecitabine (XELODA) 500 MG tablet 1,500 mg, Oral, 2 times a day (standard), For 14 days on then 7 days off   carvediloL (COREG) 25 MG tablet 25 mg, Oral, 2 times a day (standard)   cholecalciferol, vitamin D3, 1,000 unit (25 mcg) tablet 2,000 Units, Oral, Daily (standard)   cloNIDine HCL (CATAPRES) 0.3 MG tablet 0.3 mg, Oral, 2 times a day (standard)   furosemide (LASIX) 40 MG tablet 40 mg, Oral, Daily (standard), Take additional 40 mg if weight gain >3 lb in 1 day or  leg swelling    insulin NPH-insulin regular, 70/30, (HUMULIN/NOVOLIN) 100 unit/mL (70-30) injection 20 Units, Subcutaneous, 2 times a day [facility]), Can increase by 5 units if glucose is greater than 140.   isosorbide mononitrate (IMDUR) 60 MG 24 hr tablet 120 mg, Oral, Daily (standard)   lisinopriL (PRINIVIL,ZESTRIL) 20 MG tablet 20 mg, Oral, Daily (standard)   melatonin 5 mg Tab 5 mg, Oral, Nightly   mirtazapine (REMERON) 15 MG tablet 15 mg, Oral, Nightly, For sleep and appetite;   ondansetron (ZOFRAN-ODT) 4 MG disintegrating tablet 4 mg, Oral, Every 6 hours PRN   oxyCODONE (ROXICODONE) 10 mg immediate release tablet 10 mg, Oral, Every 6 hours PRN   polyethylene glycol (MIRALAX) 17 gram/dose powder 17 g, Oral, 2 times a day (standard)   promethazine (PHENERGAN) 25 MG suppository 25 mg, Rectal, Every 8 hours PRN   senna (SENOKOT) 8.6 mg tablet 2 tablets, Oral, 2 times a day (standard)       Medical History:  Past Medical History:   Diagnosis Date   ??? Cancer (CMS-HCC)    ??? Diabetes mellitus (CMS-HCC)    ??? HLD (hyperlipidemia)    ??? Hypertension    ??? Non-intractable vomiting with nausea 01/04/2019   ??? Renal disorder        Surgical History: No past surgical history on file.    Social History:  Social History     Socioeconomic History   ??? Marital status: Single     Spouse name: Not on file   ??? Number of children: 3   ??? Years of education: Not on file   ??? Highest education level: Not on file   Occupational History   ??? Not on file   Social Needs   ??? Financial resource strain: Not on file   ??? Food insecurity     Worry: Patient refused     Inability: Patient refused   ??? Transportation needs     Medical: No     Non-medical: No   Tobacco Use   ??? Smoking status: Former Smoker     Types: Cigarettes   ??? Smokeless tobacco: Never Used   Substance and Sexual Activity   ??? Alcohol use: No     Alcohol/week: 0.0 standard drinks   ??? Drug use: Yes     Types: Marijuana     Comment: Last use in April 2020   ??? Sexual activity: Not Currently   Lifestyle   ??? Physical activity     Days per week: Not on file     Minutes per session: Not on file   ??? Stress: Not on file   Relationships   ??? Social Wellsite geologist on phone: Not on file     Gets together: Not on file     Attends religious service: Not on file     Active member of club or organization: Not on file     Attends meetings of clubs or organizations: Not on file     Relationship status: Not on file   Other Topics Concern   ??? Not on file   Social History Narrative    Lives alone in [place].  Never married.  3 children (1 son and 2 daughters).  Son live in [place] and she plans to move to an apartment near him. 23 grandchildren.   Former smoker.  Half PPD x5-10 years.  Quit in 30s.  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  Associates  degree in computer programming and bachelors in hospitality/tourism.       Family History:  Reviewed with patient and noncontributory.  Diabetes in mother.    Review of Systems:  10 systems reviewed and are negative unless otherwise mentioned in HPI    Labs/Studies:  Labs and Studies from the last 24hrs per EMR and Reviewed    Physical Exam: Temp:  [36.6 ??C-37.1 ??C] 36.9 ??C  Heart Rate:  [72-85] 72  Resp:  [18-20] 20  BP: (121-147)/(70-80) 147/78  SpO2:  [88 %-98 %] 98 %    GEN: NAD, lying in bed  EYES: EOMI, anicteric sclera  ENT: Surgical mask in place,  CV: RRR, no murmurs appreciated  PULM: Diminished to no lung sounds in the left lung field, clear to auscultation in the throughout right lung ABD: soft, NT/ND, +BS  EXT: No edema, no rashes  NEURO: No focal deficits, oriented  PSYCH: A+Ox3, appropriate  GU: No CVA tenderness  MSK: No spinal tenderness, full range of motion

## 2019-04-02 NOTE — Unmapped (Signed)
Vss. No falls this shift. Call bell within reach. Resting well this shift with no complaints. Will ctm.  Problem: Adult Inpatient Plan of Care  Goal: Plan of Care Review  Outcome: Ongoing - Unchanged  Goal: Patient-Specific Goal (Individualization)  Outcome: Ongoing - Unchanged  Goal: Absence of Hospital-Acquired Illness or Injury  Outcome: Ongoing - Unchanged  Goal: Optimal Comfort and Wellbeing  Outcome: Ongoing - Unchanged  Goal: Readiness for Transition of Care  Outcome: Ongoing - Unchanged  Goal: Rounds/Family Conference  Outcome: Ongoing - Unchanged

## 2019-04-02 NOTE — Unmapped (Signed)
This patient was not seen in person. The clinical nutrition service has moved to a liaison model to minimize potential spread of COVID-19 and protect patients/providers.  During this time, we will be limiting person-to-person contact when possible.    Adult Nutrition Assessment Note    Visit Type: RN Consult  Reason for Visit: Have you had a decrease in food intake or appetite?, Have you gained or lost 10 pounds in the past 3 months?    ASSESSMENT:   HPI & PMH: [name] is a 59 y.o. 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   Past Medical History:   Diagnosis Date   ??? Cancer (CMS-HCC)    ??? Diabetes mellitus (CMS-HCC)    ??? HLD (hyperlipidemia)    ??? Hypertension    ??? Non-intractable vomiting with nausea 01/04/2019   ??? Renal disorder        Nutrition Hx: RD working remotely for infection prevention, spoke with patient via hospital phone:  Attempted Call Pt x 3, Pt unavailable to answer.    RD Phone visit 03/06/19, RN report N/V symptoms subsided overnight with meds. Pending breakfast delivery.  Pt reports she was eating and drinking WNL up until onset admitting symptoms 03/02/19. She denies N/V this am but remains lack appetite. She has had weight loss over months but unsure amount. Her UBW was 200#'s when well.  She has been followed by Dietitians at OSH for low appetite which was about 3 months prior to her dx in 11/2018.  She had tolerated supplements ensure clear and ensure max prot well which she was wanting to restart this admission.    Nutritionally Pertinent Meds: melatonin, senna, polyethylene glycol, Insulin, Vitamin D  Labs: POC Glucose 154/186/118  Lab Results   Component Value Date    NA 130 (L) 04/02/2019    K 5.7 (H) 04/02/2019    CL 100 04/02/2019    CO2 22.0 04/02/2019    BUN 34 (H) 04/02/2019    CREATININE 2.20 (H) 04/02/2019 GFR >= 60 08/19/2011    GLU 125 04/02/2019    CALCIUM 8.4 (L) 04/02/2019    ALBUMIN 3.3 (L) 04/01/2019    PHOS 5.3 (H) 04/02/2019        Skin:   Patient Lines/Drains/Airways Status    Active Wounds     None               Current nutrition therapy order:   Nutrition Orders          Nutrition Therapy Regular/House; Fluid 2000 ml starting at 01/04 1358           Anthropometric Data:  -- Height:   157.5cm  -- Last recorded weight: 83.7 kg (184 lb 8 oz)  -- Admission weight: 83.7 kg (184 lb 8 oz)  -- IBW:  50.1kg  -- Percent IBW:  167%  -- BMI: Body mass index is 33.75 kg/m??.   -- Weight changes this admission:   Last 5 Recorded Weights    04/01/19 1358   Weight: 83.7 kg (184 lb 8 oz)      -- Weight history PTA: 2.1% loss since 03/09/19;  7% loss since 10/19/18  Wt Readings from Last 10 Encounters:   04/01/19 83.7 kg (184 lb 8 oz)   04/01/19 87.5 kg (192 lb 14.4 oz)   03/09/19 85.5 kg (  188 lb 7.9 oz)   02/11/19 75.3 kg (166 lb)   02/07/19 88.6 kg (195 lb 5.2 oz)   01/24/19 85.5 kg (188 lb 9.6 oz)   01/04/19 81.7 kg (180 lb 3.2 oz)   12/18/18 85.8 kg (189 lb 2.5 oz)   10/19/18 90.2 kg (198 lb 12.8 oz)   08/10/18 90.9 kg (200 lb 8 oz)        Daily Estimated Nutrient Needs:   Energy: 1353 x 1.15-1.3 SF=1555-1759 kcals [Per Mifflin St-Jeor Equation using admission body weight, 83 kg (04/02/19 0918)]  Protein: 83 gm [1.0gm/kg using admission body weight, 83 kg (04/02/19 0918)]  Carbohydrate:   [45-60% of kcal]  Fluid: 2075 [25 mL/kg]     Nutrition Focused Physical Exam:                   Nutrition Evaluation  Overall Impressions: Unable to perform Nutrition-Focused Physical Exam at this time due to (comment) (04/02/19 0981)  Nutrition focused physical exam not completed in an effort to minimize contact between patient and provider due to infectious disease outbreak.    Nutrition Designation: Obese class I  (BMI 30.00 - 34.99 kg/m2) (04/02/19 1914)     DIAGNOSIS: Malnutrition Assessment using AND/ASPEN Clinical Characteristics:                            Overall nutrition impression: Pt likely ongoing low oral since last admission, with noted SOB.  Per record, negative BM 4 days due to hold bowel regimen, now restarted.  Renal labs increased, Pt oral intake will be monitored, for now keep regular diet with low oral. RD will plan low K/Phos supplement used past admission at present. Pt on insulin for glucose management.  Pt on fluid restriction of 2L, will monitor as well.  - Inadequate energy intake related to prior N/V symptoms and current SOB as evidenced by reduced oral for 1 month now.       RECOMMENDATIONS AND INTERVENTIONS:  Added ensure clear BID (low K/Phos)   Encourage most of 2 liters as water for hydration    RD Follow Up Parameters:  1-2 times per week (and more frequent as indicated)     Ed Blalock, MS, RD, CSO, LDN  Pager # [phone]      Consult Completed Remotely

## 2019-04-02 NOTE — Unmapped (Signed)
Vitals:    04/01/19 1151 04/01/19 1152 04/01/19 1358 04/01/19 1502   BP: 121/70  137/80 147/78   Pulse: 75  80 72   Resp: 20  18 20    Temp: 37 ??C (98.6 ??F)  37.1 ??C (98.8 ??F) 36.9 ??C (98.4 ??F)   TempSrc: Oral  Oral Oral   SpO2: 95% 98% 96% 98%   Weight:   83.7 kg (184 lb 8 oz)

## 2019-04-03 LAB — CBC W/ AUTO DIFF
BASOPHILS RELATIVE PERCENT: 0.3 %
EOSINOPHILS ABSOLUTE COUNT: 0.1 10*9/L (ref 0.0–0.4)
EOSINOPHILS RELATIVE PERCENT: 1.8 %
HEMATOCRIT: 27 % — ABNORMAL LOW (ref 36.0–46.0)
HEMOGLOBIN: 8.4 g/dL — ABNORMAL LOW (ref 12.0–16.0)
LARGE UNSTAINED CELLS: 3 % (ref 0–4)
LYMPHOCYTES ABSOLUTE COUNT: 0.7 10*9/L — ABNORMAL LOW (ref 1.5–5.0)
LYMPHOCYTES RELATIVE PERCENT: 12.2 %
MEAN CORPUSCULAR HEMOGLOBIN CONC: 31 g/dL (ref 31.0–37.0)
MEAN CORPUSCULAR HEMOGLOBIN: 29.6 pg (ref 26.0–34.0)
MEAN CORPUSCULAR VOLUME: 95.4 fL (ref 80.0–100.0)
MONOCYTES ABSOLUTE COUNT: 0.8 10*9/L (ref 0.2–0.8)
MONOCYTES RELATIVE PERCENT: 15.6 %
NEUTROPHILS ABSOLUTE COUNT: 3.5 10*9/L (ref 2.0–7.5)
NEUTROPHILS RELATIVE PERCENT: 66.7 %
PLATELET COUNT: 416 10*9/L (ref 150–440)
RED BLOOD CELL COUNT: 2.84 10*12/L — ABNORMAL LOW (ref 4.00–5.20)
RED CELL DISTRIBUTION WIDTH: 17.1 % — ABNORMAL HIGH (ref 12.0–15.0)
WBC ADJUSTED: 5.3 10*9/L (ref 4.5–11.0)

## 2019-04-03 LAB — COMPREHENSIVE METABOLIC PANEL
ALBUMIN: 2.7 g/dL — ABNORMAL LOW (ref 3.5–5.0)
ALKALINE PHOSPHATASE: 55 U/L (ref 38–126)
ALT (SGPT): 8 U/L (ref <35)
AST (SGOT): 21 U/L (ref 14–38)
BILIRUBIN TOTAL: 0.3 mg/dL (ref 0.0–1.2)
BLOOD UREA NITROGEN: 38 mg/dL — ABNORMAL HIGH (ref 7–21)
BUN / CREAT RATIO: 20
CALCIUM: 8 mg/dL — ABNORMAL LOW (ref 8.5–10.2)
CHLORIDE: 101 mmol/L (ref 98–107)
CO2: 24 mmol/L (ref 22.0–30.0)
CREATININE: 1.93 mg/dL — ABNORMAL HIGH (ref 0.60–1.00)
EGFR CKD-EPI NON-AA FEMALE: 28 mL/min/{1.73_m2} — ABNORMAL LOW (ref >=60)
GLUCOSE RANDOM: 156 mg/dL (ref 70–179)
POTASSIUM: 5.6 mmol/L — ABNORMAL HIGH (ref 3.5–5.0)
PROTEIN TOTAL: 5.7 g/dL — ABNORMAL LOW (ref 6.5–8.3)
SODIUM: 129 mmol/L — ABNORMAL LOW (ref 135–145)

## 2019-04-03 LAB — CHLORIDE: Chloride:SCnc:Pt:Ser/Plas:Qn:: 101

## 2019-04-03 LAB — EOSINOPHILS ABSOLUTE COUNT: Eosinophils:NCnc:Pt:Bld:Qn:Automated count: 0.1

## 2019-04-03 LAB — SMEAR REVIEW

## 2019-04-03 NOTE — Unmapped (Signed)
VSS. No falls this shift. Call bell within reach. Reports work of breathing improving. Resting throughout shift. C/o back pain which is well controlled with PRN oxycodone. Will ctm.  Problem: Adult Inpatient Plan of Care  Goal: Plan of Care Review  Outcome: Ongoing - Unchanged  Goal: Patient-Specific Goal (Individualization)  Outcome: Ongoing - Unchanged  Goal: Absence of Hospital-Acquired Illness or Injury  Outcome: Ongoing - Unchanged  Goal: Optimal Comfort and Wellbeing  Outcome: Ongoing - Unchanged  Goal: Readiness for Transition of Care  Outcome: Ongoing - Unchanged  Goal: Rounds/Family Conference  Outcome: Ongoing - Unchanged
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
## 2019-04-03 NOTE — Unmapped (Signed)
Care Management  Initial Transition Planning Assessment    CM met with patient in pt room.  Pt/visitors were not wearing hospital provided masks for the duration of the interaction with CM.   CM was wearing hospital provided surgical mask and hospital provided eye protection.  CM was not within 6 foot of the patient/visitors during this interaction.     Alexandra Richards is a 59 y.o. 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               General  Care Manager assessed the patient by : In person interview with patient  Orientation Level: Oriented X4  Who provides care at home?: Other (Comment)(Has aide that comes in 4 days/week)  Reason for referral: Discharge Planning    Contact/Decision Maker  Extended Emergency Contact Information  Primary Emergency Contact: Emilio Aspen  Mobile Phone: [phone]  Relation: Daughter  Preferred language: ENGLISH  Interpreter needed? No  Secondary Emergency Contact: Brelee, Renk  Mobile Phone: [phone]  Relation: Son    Armed forces operational officer Next of Kin / Guardian / POA / Advance Directives     HCDM (patient stated preference): Goldie, [place] - Son - [phone]    Advance Directive (Medical Treatment)  Does patient have an advance directive covering medical treatment?: Patient does not have advance directive covering medical treatment.  Reason patient does not have an advance directive covering medical treatment:: Patient does not wish to complete one at this time.    Health Care Decision Maker [HCDM] (Medical & Mental Health Treatment)  Healthcare Decision Maker: Patient does not wish to appoint a Health Care Decision Maker at this time  Information offered on HCDM, Medical & Mental Health advance directives:: Patient declined information.         Patient Information Lives with: Alone(Has aide services 4 days a week and children come by daily to check on her.)    Type of Residence: Private residence(Single story home with 2 STE.  She is still working on getting an apartment near her son)        Location/Detail: [address] [place] [address]    Support Systems/Concerns: Children, Family Members, Friends/Neighbors, Comment(Aide)    Responsibilities/Dependents at home?: No    Home Care services in place prior to admission?: No        Type of Residence: Mailing Address:  [address] [place] [address]  Contacts:    Patient Phone Number:   Telephone Information:   Mobile [phone]           Medical Provider(s): [facility]  Reason for Admission: Admitting Diagnosis:  pleural effusion  Past Medical History:   has a past medical history of Cancer (CMS-HCC), Diabetes mellitus (CMS-HCC), HLD (hyperlipidemia), Hypertension, Non-intractable vomiting with nausea (01/04/2019), and Renal disorder.  Past Surgical History:   has no past surgical history on file.   Previous admit date: 03/05/2019    Primary Insurance- Payor: MEDICAID [place] / Plan: MEDICAID [place] / Product Type: *No Product type* /   Secondary Insurance ??? None  Prescription Coverage ??? Yes  Preferred Pharmacy - [id number] [place], [place] - [address]  [facility] [place] [place] - [address]  [facility] [place]  [facility] [place]    Transportation home: Private vehicle  Level of function prior to admission: Requires Assistance                Equipment Currently Used at Home: commode, walker, rolling       Currently receiving outpatient dialysis?: No       Financial Information       Need for financial assistance?: No(Has SSDI and food stamps $194/month)       Social Determinants of Health  Social History     Socioeconomic History   ??? Marital status: Single     Spouse name: None   ??? Number of children: 3 ??? Years of education: None   ??? Highest education level: None   Occupational History   ??? None   Social Needs   ??? Financial resource strain: None   ??? Food insecurity     Worry: Patient refused     Inability: Patient refused   ??? Transportation needs     Medical: No     Non-medical: No   Tobacco Use   ??? Smoking status: Former Smoker     Types: Cigarettes   ??? Smokeless tobacco: Never Used   Substance and Sexual Activity   ??? Alcohol use: No     Alcohol/week: 0.0 standard drinks   ??? Drug use: Yes     Types: Marijuana     Comment: Last use in April 2020   ??? Sexual activity: Not Currently   Lifestyle   ??? Physical activity     Days per week: None     Minutes per session: None   ??? Stress: None   Relationships   ??? Social Wellsite geologist on phone: None     Gets together: None     Attends religious service: None     Active member of club or organization: None     Attends meetings of clubs or organizations: None     Relationship status: None   Other Topics Concern   ??? None   Social History Narrative    Lives alone in [place].  Never married.  3 children (1 son and 2 daughters).  Son live in [place] and she plans to move to an apartment near him. 23 grandchildren.   Former smoker.  Half PPD x5-10 years.  Quit in 54s.  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  Associates degree in computer programming and bachelors in hospitality/tourism.     Housing/Utilities   ??? Within the past 12 months, have you ever stayed: outside, in a car, in a tent, in an overnight shelter, or temporarily in someone else's home (i.e. couch-surfing)?     ??? Are you worried about losing your housing?     ??? Within the past 12 months, have you been unable to get utilities (heat, electricity) when it was really needed?       Literacy   ??? How often do you need to have someone help you when you read instructions, pamphlets, or other written material from your doctor or pharmacy?         Discharge Needs Assessment Concerns to be Addressed: other (see comments)(Needs HH, patient refused SNF)    Clinical Risk Factors: Principal Diagnosis: Cancer, Stroke, COPD, Heart Failure, AMI, Pneumonia, Joint Replacment, Multiple Diagnoses (Chronic), Readmission < 30 Days    Barriers to taking medications: No    Prior overnight hospital stay or ED  visit in last 90 days: Yes    Readmission Within the Last 30 Days: current reason for admission unrelated to previous admission         Anticipated Changes Related to Illness: inability to work    Equipment Needed After Discharge: other (see comments)(Rollator)    Discharge Facility/Level of Care Needs: other (see comments)(Home.  Patient refused short term SNF placement)    Readmission  Risk of Unplanned Readmission Score: UNPLANNED READMISSION SCORE: 49%  Predictive Model Details           49% (High) Factors Contributing to Score   Calculated 04/03/2019 12:03 16% Number of active Rx orders is 42   [place] Risk of Unplanned Readmission Model 16% Number of ED visits in last six months is 5     11% Number of hospitalizations in last year is 4     7% Charlson Comorbidity Index is 11     6% Diagnosis of cancer is present     5% ECG/EKG order is present in last 6 months     5% Latest calcium is low (8.0 mg/dL)     5% Latest BUN is high (38 mg/dL)     4% Diagnosis of electrolyte disorder is present     4% Imaging order is present in last 6 months     3% Latest hemoglobin is low (8.4 g/dL)     3% Phosphorous result is present     3% Age is 58     3% Diagnosis of deficiency anemia is present     3% Active anticoagulant Rx order is present     2% Latest creatinine is high (1.93 mg/dL)     2% Diagnosis of renal failure is present     1% Future appointment is scheduled     1% Current length of stay is 1.919 days     Readmitted Within the Last 30 Days? (No if blank) Yes  Patient at risk for readmission?: Yes    Discharge Plan Screen findings are: Discharge planning needs identified or anticipated (Comment).(Home health and rollator)    Expected Discharge Date:     Expected Transfer from Critical Care: (NA)    Patient and/or family were provided with choice of facilities / services that are available and appropriate to meet post hospital care needs?: Yes   List choices in order highest to lowest preferred, if applicable. : No prreference for [facility] or DME provider    Initial Assessment complete?: Yes

## 2019-04-03 NOTE — Unmapped (Addendum)
Alexandra Richards is a 59 yo woman with metastatic breast cancer, hypertension, T2DM, and chronic kidney disease who presented with acute hypoxemic respiratory failure 2/2 a large L-sided pleural effusion. Hospital course is outlined below by problem.     Acute hypoxemic respiratory failure - Large left pleural effusion  She was hypoxic on presentation, requiring 2L oxygen by nasal cannula to maintain SpO2. CXR showed whiteout of her left lung. She underwent thoracentesis with interventional pulmonology on 1/5 with ~2L fluid removed. Pleural fluid studies were consistent with an exudative, lymphocytic effusion suggesting malignant etiology. After being tapped, patient breathing was much improved, no longer required any supplemental O2.     Major depressive disorder   Patient was notably tearful, lack of hope over current medical situation and endorsed thoughts of wanting to end her life, no plan. Psychiatry was consulted and recommended to hold off on starting antidepressants. She was started on a multivitamin and thiamine supplements.     Constipation  This has been a chronic problem for her. She was taking miralax tid and senna bid outpatient. She received magnesium citrate on 1/5 and had a bowel movement.     Metastatic breast cancer (Triple -, PD-L1 -)  Her primary oncologist is Dr. Archie Balboa. 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 She received palliative radiation to bony metastases in her pelvis and proximal femur between 12/21/18-01/02/2019. PDL-1 testing was negative and she was started on capecitabine. This was held on 01/31/2019 due to CrCl<30. She was hospitalized from 03/05/19-03/11/2019 for nausea, vomiting and inability to tolerate PO with severe constipation noted on CT A/P. Due to these GI toxicities she was taken off capecitabine.     T2DM Patient was placed on NPH 20um bid and sliding scale while inpatient. Blood sugars remained well controlled. Restarted on home regimen on discharge NPH 70/30.     Hypertension  Remained stable throughout hospitalization. She was continued on her home regimen: amlodipine 10 mg, Coreg 25 mg bid, Imdur 120 mg daily and clonidine 0.3mg  bid. Her home lisinipril was initially held in the setting of her AKI, discontinued on discharge.     CKD - Hyperkalemia  Baseline Cr ~1.5-2.0. Cr elevated at 2.14 on admission, peaked at 2.20. Lisinopril and lasix were held. Downtrended. Potassium also elevated to 5.7, patient remained asymptomatic without any EKG changes. Kayexalate given prior to discharge.    HFpEF  Held Lasix in the setting of her AKI, was intermittently using at home for volume overload. Discontinued on discharge.

## 2019-04-03 NOTE — Unmapped (Signed)
PHYSICAL THERAPY  Evaluation (04/03/19 0808)     Patient Name:  Alexandra Richards [facility]       Medical Record Number: [id number]   Date of Birth: January 06, 1961  Sex: Female            Treatment Diagnosis: BLE weakness, impaired activity tolerance, impaired balance, impaired functional mobility    ASSESSMENT  Problem List: Decreased strength, Impaired sensation, Impaired balance, Decreased mobility, Fall Risk, Decreased endurance, Impaired ADLs     Assessment : Alexandra Richards is a 59 y.o. 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 She presents to acute PT services with BLE weakness, impaired balance, impaired activity tolerance, and impaired functional mobility. She required mod assist to stand with a walker and was unable to attempt ambulation due to lightheadedness. Pt will benefit from ongoing acute and 5x(low) post acute PT services at this time to address deficits. After a review of the personal factors, comorbidities, clinical presentation, and examination of the number of affected body systems, the patient presents as a moderate complexity case.     Today's Interventions: PT Eval, transfer training, pt education re: PT role, POC, upright tolerance, staff assist for safety, importance of increasing activity as able        PLAN  Planned Frequency of Treatment:  1-2x per day for: 3-4x week      Planned Interventions: Balance activities, Education - Patient, Education - Family / caregiver, Endurance activities, Functional mobility, Home exercise program, Gait training, Self-care / Home training, Stair training, Therapeutic exercise, Therapeutic activity, Transfer training    Post-Discharge Physical Therapy Recommendations:  5x weekly, Low intensity    PT DME Recommendations: None(pt owns RW and BSC)           Goals: Patient and Family Goals: to strengthen her legs    Long Term Goal #1: Pt will ambulate 300' mod IND with LRAD in 4 weeks       SHORT GOAL #1: Pt will perform supine to/from sit transition modified independently              Time Frame : 2 weeks  SHORT GOAL #2: Pt will perform sit <> stand SBA with LRAD              Time Frame : 2 weeks  SHORT GOAL #3: Pt will ambulate 100' SBA with LRAD              Time Frame : 2 weeks  SHORT GOAL #4: Pt will negotiate 2 steps with rail and min A              Time Frame : 2 weeks                      Prognosis:  Fair  Positive Indicators: Age  Barriers to Discharge: Inability to safely perform ADLS, Functional strength deficits, Gait instability, Impaired Balance, Inaccessible home environment, Endurance deficits    SUBJECTIVE  Patient reports: Pt agreeable to PT, I'm weaker than I thought after standing attempt, pt attributed weakness to not being OOB since admission  Current Functional Status: Session began and ended with pt semi-reclined in bed, needs in reach, RN aware, MD in room to speak with pt  Services patient receives: Intensive In Home Services (MH)(HH Aide 4 hours/day)  Prior Functional Status:  Pt ambulates short distances at home with a RW, no recent falls. She has a HH aide 4 hours/day to assist with bathing (sometimes in the shower with seat, other times bed bath), dressing, meals. Her family performs all driving and grocery shopping. She reported her Daughter assists her as needed and her Grandson stays with her at night, she is rarely alone.  Equipment available at home: [employer], Bedside commode     Past Medical History:   Diagnosis Date   ??? Cancer (CMS-HCC)    ??? Diabetes mellitus (CMS-HCC)    ??? HLD (hyperlipidemia)    ??? Hypertension    ??? Non-intractable vomiting with nausea 01/04/2019   ??? Renal disorder     Social History     Tobacco Use   ??? Smoking status: Former Smoker     Types: Cigarettes   ??? Smokeless tobacco: Never Used   Substance Use Topics ??? Alcohol use: No     Alcohol/week: 0.0 standard drinks      No past surgical history on file. History reviewed. No pertinent family history.     Allergies: Patient has no known allergies.                Objective Findings  Precautions / Restrictions  Precautions: Falls precautions  Weight Bearing Status: Non-applicable  Required Braces or Orthoses: Non-applicable    Communication Preference: Verbal   Pain Comments: reported baseline low back pain, did not rate. MD in room and aware  Medical Tests / Procedures: chart reviewed  Equipment / Environment: Vascular access (PIV, TLC, Port-a-cath, PICC), Patient not wearing mask for full session, Bedside commode, Supplemental oxygen(purewick)    At Rest: VSS on 2L O2 via [place]  With Activity: spo2 96% 2L O2, HR 78  Orthostatics: pt reported transient lightheadedness sitting EOB, asymptomatic on first stand. Second stand pt reported lightheadedness and asked to return sitting. Upon return supine BP 158/78       Living Situation  Living Environment: House  Lives With: Other(Daughter and Lucila Maine have been staying with her when aide not present. pt reported rarely being alone)  Home Living: One level home, Stairs to enter with rails, Walk-in shower, Standard height toilet, Built-in shower seat, Tub/shower unit  Rail placement (outside): Bilateral rails  Number of Stairs: 2     Cognition: Flat affect, oriented x4          UE ROM: WFL  UE Strength: WFL  LE ROM: WFL  LE Strength: Grossly 3/5                       Sensation: pt reported baseline numbness in bilateral feet, otherwise intact  Balance: sitting SBA, static standing balance min A with RW         Bed Mobility: supine <> sit SBA  Transfers: sit <> stand mod A no device with gait belt requiring increased time to perform and cues for hip extension. Second trial mod A with RW, improved stability upon standing.   Gait  Gait: unable due to lightheadedness.         Endurance: fair    Physical Therapy Session Duration PT Individual - Duration: 30    Medical Staff Made Aware: RN Al Decant updated    I attest that I have reviewed the above information.  Signed: Salome Holmes, PT  Filed 04/03/2019

## 2019-04-03 NOTE — Unmapped (Signed)
Hypertensive once and other vital signs were stable. Administered oxycodone for pain. Given SPG 15g per MD order for serum potassium level 5.7. Insuline given per sliding scale. Physical therapy and occupational therapist consulted today. Psychologist consulted due to her depression. Bedside commode in her room. Call bell within the reach and bed in the lowest position. Will continue to monitor.   Problem: Fall Injury Risk  Goal: Absence of Fall and Fall-Related Injury  Outcome: Not Progressing  Intervention: Identify and Manage Contributors to Fall Injury Risk  Flowsheets (Taken 04/03/2019 1537)  Medication Review/Management: medications reviewed  Self-Care Promotion: independence encouraged  Intervention: Promote Injury-Free Environment  Flowsheets (Taken 04/03/2019 1537)  Safety Interventions:   bleeding precautions   assistive device   low bed   commode/urinal/bedpan at bedside   fall reduction program maintained   nonskid shoes/slippers when out of bed  Environmental Safety Modification: assistive device/personal items within reach     Problem: Self-Care Deficit  Goal: Improved Ability to Complete Activities of Daily Living  Outcome: Not Progressing  Intervention: Promote Activity and Functional Independence  Flowsheets (Taken 04/03/2019 1537)  Activity Assistance Provided: assistance, 2 people  Self-Care Promotion: independence encouraged     Problem: Skin Injury Risk Increased  Goal: Skin Health and Integrity  Outcome: Not Progressing  Intervention: Optimize Skin Protection  Flowsheets (Taken 04/03/2019 1537)  Pressure Reduction Techniques: frequent weight shift encouraged     Problem: Adjustment to Illness (Heart Failure)  Goal: Optimal Coping  Outcome: Not Progressing  Intervention: Support and Optimize Psychosocial Response to Chronic Illness  Flowsheets (Taken 04/03/2019 1537)  Life Transition/Adjustment: decision-making facilitated  Supportive Measures:   relaxation techniques promoted   self-care encouraged self-reflection promoted   decision-making supported   counseling provided  Family/Support System Care:   involvement promoted   support provided   self-care encouraged     Problem: Fluid Imbalance (Heart Failure)  Goal: Fluid Balance  Outcome: Not Progressing     Problem: Diabetes Comorbidity  Goal: Blood Glucose Level Within Desired Range  Outcome: Not Progressing  Intervention: Maintain Glycemic Control  Flowsheets (Taken 04/03/2019 1537)  Glycemic Management: blood glucose monitoring

## 2019-04-03 NOTE — Unmapped (Signed)
Afebrile and other vital signs were stable. # 22 Peripheral iv access remained dry and intact. 2 liters fluid restriction on. Given insulin per sliding scale. 2 liters/min nasal cannula. Thoracentesis done with 2.1 liters. Call bell within the reach and bed in the lowest position.   Problem: Fall Injury Risk  Goal: Absence of Fall and Fall-Related Injury  Outcome: Not Progressing  Intervention: Identify and Manage Contributors to Fall Injury Risk  Flowsheets (Taken 04/02/2019 1630)  Medication Review/Management: medications reviewed  Self-Care Promotion: independence encouraged  Intervention: Promote Injury-Free Environment  Flowsheets (Taken 04/02/2019 1630)  Safety Interventions:   bleeding precautions   fall reduction program maintained   low bed  Environmental Safety Modification: assistive device/personal items within reach     Problem: Self-Care Deficit  Goal: Improved Ability to Complete Activities of Daily Living  Outcome: Not Progressing  Intervention: Promote Activity and Functional Independence  Flowsheets (Taken 04/02/2019 1630)  Activity Assistance Provided: assistance, 1 person  Self-Care Promotion: independence encouraged     Problem: Skin Injury Risk Increased  Goal: Skin Health and Integrity  Outcome: Not Progressing  Intervention: Optimize Skin Protection  Flowsheets (Taken 04/02/2019 1630)  Pressure Reduction Techniques: rest period provided between sit times  Intervention: Promote and Optimize Oral Intake  Flowsheets (Taken 04/02/2019 1630)  Oral Nutrition Promotion:   physical activity promoted   rest periods promoted     Problem: Adjustment to Illness (Heart Failure)  Goal: Optimal Coping  Outcome: Not Progressing  Intervention: Support and Optimize Psychosocial Response to Chronic Illness  Flowsheets (Taken 04/02/2019 1630)  Supportive Measures:   relaxation techniques promoted   self-care encouraged  Family/Support System Care: self-care encouraged     Problem: Fluid Imbalance (Heart Failure) Goal: Fluid Balance  Outcome: Not Progressing  Intervention: Monitor and Manage Fluid Balance  Flowsheets (Taken 04/02/2019 1630)  Fluid/Electrolyte Management: fluids restricted     Problem: Diabetes Comorbidity  Goal: Blood Glucose Level Within Desired Range  Outcome: Not Progressing  Intervention: Maintain Glycemic Control  Flowsheets (Taken 04/02/2019 1630)  Glycemic Management: blood glucose monitoring

## 2019-04-03 NOTE — Unmapped (Signed)
Oncology (MDE2) Progress Note    Subjective/24 hr events:    No acute events overnight. Reports breathing has improved. Currently off oxygen, will closely monitor respiratory status. Patient is tearful with a depressed mood and feels hopeless over her current medical situation. Admits to having thoughts about wanting her life to end, reports if I took my life I know I would not go to heaven. Will consult psychiatry, appreciate their recs. Will also touch base with her primary oncologist Dr. Archie Balboa to speak with her regarding further treatment options/prognosis. Encouraged patient to get outside and talk with the chaplain for more support.   Assessment & Plan:   Alexandra Richards is a 59 y.o. 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 with DOE found to have AHRF 2/2 large pleural effusion.    Principal Problem:    Hypoxia  Active Problems:    Anemia    (HFpEF) heart failure with preserved ejection fraction (CMS-HCC)    AKI (acute kidney injury) (CMS-HCC)    Constipation    Malignant pleural effusion  Resolved Problems:    * No resolved hospital problems. *    Acute hypoxemic respiratory failure (improved): complete white out of her left lung on admission requiring 2L O2, likely secondary to progressive malignant pleural effusion.  IP consulted and performed therapeutic and diagnostic thoracentesis on 1/5 w/ ~2L fluid removed, lymphocytic predominent suggesting malignant etiology.  - Appreciate IP's recommendations??  - CTM respiratory status    Depressed mood: tearful, lack of hope over current medical situation, endorses thoughts of wanting to end her life, no plan.   - Psych consult, appreciate recs Triple negative, PD-L1 negative metastatic breast cancer to bone & lung: He received radiation therapy between 12/21/18-01/02/19. Bone scan with several areas of bone lesions. 03/05/2019: CT chest: Increased size left breast mass. ??Increased size/number of pulmonary nodules. ??Pleural based metastases moderate left pleural effusion. Brain MR no evidence of intracranial metastases. ??Stable Lt LAD, 1 month. ??CT CAP: Diffuse osseous mets. ??No focal hepatic lesions.  Including palliative radiation to the pelvis and proximal femur.  Otherwise she was started on capecitabine however this was discontinued in the setting of GI toxicities, specifically constipation.   - Follows w/ Dr. Archie Balboa notified of admission, plans to come speak to her today while inpatient to discuss treatment options/prognosis  - Not currently on chemotherapy    Constipation (resolved)  - Miralax daily, 2 tabs senna??    AKI on CKD - Hyperkalemia - Hyponatremia: Cr improving, bl ~1.5-2.0. Potassium elevated 5.7 yesterday, without EKG changes. Na continues to downtrend, 129 today.   - kayexalate  - hold home lisinopril, lasix    T2DM: NPH 20u bid, SSI  ??  HTN  - continue home regimen: amlodipine 10 mg, Coreg 25 mg bid, Imdur 120 mg daily, clonidine 0.3mg  bid  - hold home lisinopril in the setting of her AKI  ????  HFpEF: Holding Lasix in the setting of AKI, however closely monitor volume status as she may require diuresis.     Sleep disorder  - continue melatonin 9 mg nightly  - continue home Remeron     Daily Checklist:  Diet: Regular Diet  DVT PPx: Heparin 5000units q8h  Electrolytes: Potassium Repleted  Code Status: Full Code  Dispo: Home    Objective:   Temp:  [36.7 ??C-37.3 ??C] 37 ??C  Heart Rate:  [70-83] 70  Resp:  [18-20] 20  BP: (129-147)/(65-77) 147/75  SpO2:  [97 %-100 %] 100 %    Gen: elderly female, lying in bed, tearful   HEENT: atraumatic, sclera anicteric, MMM  Heart: RRR, S1, S2, no M/R/G, no chest wall tenderness Lungs: Diminished lung sounds on left side, no crackles or wheezes, no use of accessory muscles  Abdomen: Normoactive bowel sounds, soft, NTND, no rebound/guarding  Extremities: no clubbing, cyanosis, or edema  Psych: depressed mood, flat affect    Labs/Studies: Labs and Studies from the last 24hrs per EMR and Reviewed

## 2019-04-04 LAB — THYROID STIMULATING HORMONE: Thyrotropin:ACnc:Pt:Ser/Plas:Qn:: 2.96

## 2019-04-04 LAB — CBC W/ AUTO DIFF
BASOPHILS ABSOLUTE COUNT: 0 10*9/L (ref 0.0–0.1)
BASOPHILS RELATIVE PERCENT: 0.3 %
EOSINOPHILS ABSOLUTE COUNT: 0.1 10*9/L (ref 0.0–0.4)
HEMATOCRIT: 26.7 % — ABNORMAL LOW (ref 36.0–46.0)
HEMOGLOBIN: 8.5 g/dL — ABNORMAL LOW (ref 12.0–16.0)
LARGE UNSTAINED CELLS: 3 % (ref 0–4)
LYMPHOCYTES ABSOLUTE COUNT: 0.8 10*9/L — ABNORMAL LOW (ref 1.5–5.0)
LYMPHOCYTES RELATIVE PERCENT: 12.1 %
MEAN CORPUSCULAR HEMOGLOBIN CONC: 31.8 g/dL (ref 31.0–37.0)
MEAN CORPUSCULAR HEMOGLOBIN: 30 pg (ref 26.0–34.0)
MEAN PLATELET VOLUME: 9.5 fL (ref 7.0–10.0)
MONOCYTES ABSOLUTE COUNT: 0.9 10*9/L — ABNORMAL HIGH (ref 0.2–0.8)
MONOCYTES RELATIVE PERCENT: 13.9 %
NEUTROPHILS ABSOLUTE COUNT: 4.4 10*9/L (ref 2.0–7.5)
NEUTROPHILS RELATIVE PERCENT: 69.2 %
PLATELET COUNT: 430 10*9/L (ref 150–440)
RED CELL DISTRIBUTION WIDTH: 16.8 % — ABNORMAL HIGH (ref 12.0–15.0)
WBC ADJUSTED: 6.3 10*9/L (ref 4.5–11.0)

## 2019-04-04 LAB — COMPREHENSIVE METABOLIC PANEL
ALBUMIN: 2.8 g/dL — ABNORMAL LOW (ref 3.5–5.0)
ALKALINE PHOSPHATASE: 53 U/L (ref 38–126)
ALT (SGPT): 10 U/L (ref <35)
ANION GAP: 9 mmol/L (ref 7–15)
AST (SGOT): 22 U/L (ref 14–38)
BLOOD UREA NITROGEN: 36 mg/dL — ABNORMAL HIGH (ref 7–21)
BUN / CREAT RATIO: 20
CALCIUM: 7.7 mg/dL — ABNORMAL LOW (ref 8.5–10.2)
CHLORIDE: 101 mmol/L (ref 98–107)
CO2: 23 mmol/L (ref 22.0–30.0)
CREATININE: 1.81 mg/dL — ABNORMAL HIGH (ref 0.60–1.00)
EGFR CKD-EPI AA FEMALE: 35 mL/min/{1.73_m2} — ABNORMAL LOW (ref >=60)
EGFR CKD-EPI NON-AA FEMALE: 30 mL/min/{1.73_m2} — ABNORMAL LOW (ref >=60)
GLUCOSE RANDOM: 154 mg/dL (ref 70–179)
PROTEIN TOTAL: 5.6 g/dL — ABNORMAL LOW (ref 6.5–8.3)
SODIUM: 133 mmol/L — ABNORMAL LOW (ref 135–145)

## 2019-04-04 LAB — BUN / CREAT RATIO: Urea nitrogen/Creatinine:MRto:Pt:Ser/Plas:Qn:: 20

## 2019-04-04 LAB — MEAN CORPUSCULAR VOLUME: Erythrocyte mean corpuscular volume:EntVol:Pt:RBC:Qn:Automated count: 94.5

## 2019-04-04 MED ORDER — MELATONIN 3 MG TABLET
ORAL_TABLET | Freq: Every evening | ORAL | 1 refills | 30 days | Status: CP
Start: 2019-04-04 — End: ?
  Filled 2019-04-04: qty 60, 30d supply, fill #0

## 2019-04-04 MED ORDER — AMLODIPINE 10 MG TABLET: 10 mg | tablet | Freq: Every day | 3 refills | 30 days

## 2019-04-04 MED ORDER — THIAMINE HCL (VITAMIN B1) 100 MG TABLET
ORAL_TABLET | Freq: Every day | ORAL | 0 refills | 100 days | Status: CP
Start: 2019-04-04 — End: 2019-07-13
  Filled 2019-04-04: qty 100, 100d supply, fill #0

## 2019-04-04 MED ORDER — AMLODIPINE 10 MG TABLET
ORAL_TABLET | Freq: Every day | ORAL | 3 refills | 30.00000 days | Status: CP
Start: 2019-04-04 — End: 2019-04-04
  Filled 2019-04-04: qty 30, 30d supply, fill #0

## 2019-04-04 MED FILL — THIAMINE HCL (VITAMIN B1) 100 MG TABLET: 100 days supply | Qty: 100 | Fill #0 | Status: AC

## 2019-04-04 MED FILL — AMLODIPINE 10 MG TABLET: 30 days supply | Qty: 30 | Fill #0 | Status: AC

## 2019-04-04 MED FILL — THERA-M 9 MG IRON-400 MCG TABLET: 130 days supply | Qty: 130 | Fill #0 | Status: AC

## 2019-04-04 MED FILL — MELATONIN 3 MG TABLET: 30 days supply | Qty: 60 | Fill #0 | Status: AC

## 2019-04-04 NOTE — Unmapped (Signed)
INTERVENTIONAL PULMONOLOGY FOLLOW-UP CONSULT NOTE    Assessment:     [name] is a 59 year old female with metastatic breast cancer who was admitted with shortness of breath and found to have a large left pleural effusion. She underwent thoracentesis on 04/02/2019 with pleural fluid studies consistent with a lymphocytic exudate. Cytology is pending.    We will follow up on pleural fluid cytology and will see her as an outpatient on Monday 04/08/2019 to assess for re-accumulation of her effusion.     Plan:     1. Large left pleural effusion s/p thoracentesis with 2.1L drained 1/11.    2. Will follow up pleural fluid cytology.    3. We will call Alexandra Richards to make an appointment for follow up Monday.    4. Please include the following information in her discharge instructions:    Thank you for allowing the Interventional Pulmonary team to participate in your care. Please do not hesitate to contact us with questions or concerns.     Interventional Pulmonary:  Barbara Cower A. Erick Colace MD  A. Mardene Speak, MD  Ernest Haber. Cormick Moss, DO    Interventional Pulmonary Fellow: Cleora Fleet, MD    During business hours please contact me via Tammy Allred at [phone] or page her at [phone].    For scheduling questions or concerns, please call our administrator, Darrel Reach at [phone].    For urgent issues after hours please call [phone] [facility] Operator) and ask to speak with the Interventional Pulmonary Fellow on call.    Please call the Interventional Pulmonary pager at [phone] with any questions.    Estefano Victory R. Diella Gillingham, DO  Assistant Professor of Medicine  Interventional Pulmonology  Division of Pulmonary Diseases and Critical Care Medicine      Subjective:     S: Alexandra Richards says she feels better. She denies shortness of breath, orthopnea, fevers, chills.    Objective:     Physical Examination:     BP 145/67  - Pulse 90  - Temp 37.1 ??C (98.7 ??F) (Oral)  - Resp 22  - Wt 83.7 kg (184 lb 8 oz)  - SpO2 94%  - BMI 33.75 kg/m??    General appearance - alert, well appearing, and in no distress  Heart - normal rate, regular rhythm, normal S1, S2, no murmurs, rubs, clicks or gallops  Chest - clear to auscultation, no wheezes, rales or rhonchi, symmetric air entry  Abdomen - soft, nontender, nondistended, no masses or organomegaly  Extremities - No pedal edema, no clubbing or cyanosis    Labs and Imaging:    No new imaging.

## 2019-04-04 NOTE — Unmapped (Signed)
Physician Discharge Summary [facility]  [address]  [place] [address]  Dept: [phone]  Loc: [phone]     Identifying Information:   Raejean Swinford [facility]  [birth year]  [id number]    Primary Care Physician: Tyson Babinski SVC PROSPECT H     Referring Physician: Olivia Mackie     Code Status: Full Code    Admit Date: 04/01/2019    Discharge Date: 04/04/2019     Discharge To: Home    Discharge Service: [facility] - MDE - Solid Tumor     Discharge Attending Physician: Ansel Bong, MD    Discharge Diagnoses:  Principal Problem:    Hypoxia  Active Problems:    Anemia    (HFpEF) heart failure with preserved ejection fraction (CMS-HCC)    AKI (acute kidney injury) (CMS-HCC)    Constipation    Malignant pleural effusion    Current severe episode of major depressive disorder without psychotic features without prior episode (CMS-HCC)  Resolved Problems:    * No resolved hospital problems. *      Outpatient Provider Follow Up Issues:   Follow-up Plan after discharge:  1. Issues related to hospitalization: hypoxia, constipation, and depression  2. Follow-up appointment for lab draws: On 04/12/19 at 9 AM  3. Follow-up appointment with [facility]: Alveda Reasons, NP on 04/08/19 at 10:30 AM  4. Oncology specific plans going forward: TBD    Patient's primary oncologist and/or nurse navigator: Dr. Fayrene Fearing handoff via Epic message or direct conversation?: Yes.    Hospital Course:   Alannie Amodio Hamlet is a 59 yo woman with metastatic breast cancer, hypertension, T2DM, and chronic kidney disease who presented with acute hypoxemic respiratory failure 2/2 a large L-sided pleural effusion. Hospital course is outlined below by problem.     Acute hypoxemic respiratory failure - Large left pleural effusion She was hypoxic on presentation, requiring 2L oxygen by nasal cannula to maintain SpO2. CXR showed whiteout of her left lung. She underwent thoracentesis with interventional pulmonology on 1/5 with ~2L fluid removed. Pleural fluid studies were consistent with an exudative, lymphocytic effusion suggesting malignant etiology. After being tapped, patient breathing was much improved, no longer required any supplemental O2.     Major depressive disorder   Patient was notably tearful, lack of hope over current medical situation and endorsed thoughts of wanting to end her life, no plan. Psychiatry was consulted and recommended to hold off on starting antidepressants. She was started on a multivitamin and thiamine supplements.     Constipation  This has been a chronic problem for her. She was taking miralax tid and senna bid outpatient. She received magnesium citrate on 1/5 and had a bowel movement.     Metastatic breast cancer (Triple -, PD-L1 -)  Her primary oncologist is Dr. Archie Balboa. 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 She received palliative radiation to bony metastases in her pelvis and proximal femur between 12/21/18-01/02/2019. PDL-1 testing was negative and she was started on capecitabine. This was held on 01/31/2019 due to CrCl<30. She was hospitalized from 03/05/19-03/11/2019 for nausea, vomiting and inability to tolerate PO with severe constipation noted on CT A/P. Due to these GI toxicities she was taken off capecitabine.     T2DM  Patient was placed on NPH 20um bid and sliding scale while inpatient. Blood sugars remained well controlled. Restarted on home regimen on discharge NPH 70/30.  Hypertension Remained stable throughout hospitalization. She was continued on her home regimen: amlodipine 10 mg, Coreg 25 mg bid, Imdur 120 mg daily and clonidine 0.3mg  bid. Her home lisinipril was initially held in the setting of her AKI, discontinued on discharge.     CKD - Hyperkalemia  Baseline Cr ~1.5-2.0. Cr elevated at 2.14 on admission, peaked at 2.20. Lisinopril and lasix were held. Downtrended. Potassium also elevated to 5.7, patient remained asymptomatic without any EKG changes. Kayexalate given prior to discharge.    HFpEF  Held Lasix in the setting of her AKI, was intermittently using at home for volume overload. Discontinued on discharge.       Procedures:  thoracentesis   No admission procedures for hospital encounter.  ______________________________________________________________________  Discharge Medications:     Your Medication List      STOP taking these medications    lisinopriL 20 MG tablet  Commonly known as: PRINIVIL,ZESTRIL        START taking these medications    multivitamins, therapeutic with minerals 9 mg iron-400 mcg tablet  Take 1 tablet by mouth daily.  Start taking on: April 05, 2019     thiamine 100 MG tablet  Commonly known as: B-1  Take 1 tablet (100 mg total) by mouth daily.        CHANGE how you take these medications    amLODIPine 10 MG tablet  Commonly known as: NORVASC  Take 1 tablet (10 mg total) by mouth daily.  What changed: medication strength     melatonin 3 mg Tab  Take 2 tablets (6 mg total) by mouth nightly.  What changed:   ?? medication strength  ?? how much to take        CONTINUE taking these medications    atorvastatin 40 MG tablet  Commonly known as: LIPITOR  Take 40 mg by mouth nightly.     CALCIUM ORAL  Take 1 tablet by mouth.     carvediloL 25 MG tablet  Commonly known as: COREG  Take 1 tablet (25 mg total) by mouth Two (2) times a day.     cholecalciferol (vitamin D3) 1,000 unit (25 mcg) tablet  Take 2 tablets (2,000 Units total) by mouth daily. cloNIDine HCL 0.3 MG tablet  Commonly known as: CATAPRES  Take 0.3 mg by mouth Two (2) times a day.     furosemide 40 MG tablet  Commonly known as: LASIX  Take 40 mg by mouth daily. Take additional 40 mg if weight gain >3 lb in 1 day or leg swelling     insulin NPH-insulin regular (70/30) 100 unit/mL (70-30) injection  Commonly known as: HumuLIN/NovoLIN  Inject 0.2 mL (20 Units total) under the skin Two (2) times a day (30 minutes before a meal). Can increase by 5 units if glucose is greater than 140.     isosorbide mononitrate 60 MG 24 hr tablet  Commonly known as: IMDUR  Take 2 tablets (120 mg total) by mouth daily.     mirtazapine 15 MG tablet  Commonly known as: REMERON  Take 1 tablet (15 mg total) by mouth nightly. For sleep and appetite;     ON CALL EXPRESS TEST STRIP Strp  Generic drug: blood sugar diagnostic  Use to check blood sugar by Other route Four (4) times a day (before meals and nightly).     ondansetron 4 MG disintegrating tablet  Commonly known as: ZOFRAN-ODT  Take 1 tablet (4 mg total) by mouth every six (6) hours  as needed for nausea.     oxyCODONE 10 mg immediate release tablet  Commonly known as: ROXICODONE  Take 1 tablet (10 mg total) by mouth every six (6) hours as needed for pain.     polyethylene glycol 17 gram/dose powder  Commonly known as: MIRALAX  Take 17 g by mouth Two (2) times a day.     promethazine 25 MG suppository  Commonly known as: PHENERGAN  Insert 1 suppository (25 mg total) into the rectum every eight (8) hours as needed for nausea (vomiting) for up to 30 doses.     senna 8.6 mg tablet  Commonly known as: SENOKOT  Take 2 tablets by mouth Two (2) times a day.     TylenoL 325 MG tablet  Generic drug: acetaminophen  Take 650 mg by mouth every six (6) hours as needed.            Allergies:  Patient has no known allergies.  ______________________________________________________________________  Pending Test Results (if blank, then none):  Pending Labs     Order Current Status Cytology - Fluid In process    Pleural Fluid Culture Preliminary result          Most Recent Labs:  All lab results last 24 hours -   Recent Results (from the past 24 hour(s))   POCT Glucose    Collection Time: 04/03/19  5:16 PM   Result Value Ref Range    Glucose, POC 230 (H) 70 - 179 mg/dL   POCT Glucose    Collection Time: 04/03/19  8:49 PM   Result Value Ref Range    Glucose, POC 156 70 - 179 mg/dL   Comprehensive Metabolic Panel    Collection Time: 04/04/19  6:27 AM   Result Value Ref Range    Sodium 133 (L) 135 - 145 mmol/L    Potassium 5.5 (H) 3.5 - 5.0 mmol/L    Chloride 101 98 - 107 mmol/L    Anion Gap 9 7 - 15 mmol/L    CO2 23.0 22.0 - 30.0 mmol/L    BUN 36 (H) 7 - 21 mg/dL    Creatinine 1.61 (H) 0.60 - 1.00 mg/dL    BUN/Creatinine Ratio 20     EGFR CKD-EPI Non-African American, Female 30 (L) >=60 mL/min/1.62m2    EGFR CKD-EPI African American, Female 35 (L) >=60 mL/min/1.57m2    Glucose 154 70 - 179 mg/dL    Calcium 7.7 (L) 8.5 - 10.2 mg/dL    Albumin 2.8 (L) 3.5 - 5.0 g/dL    Total Protein 5.6 (L) 6.5 - 8.3 g/dL    Total Bilirubin 0.3 0.0 - 1.2 mg/dL    AST 22 14 - 38 U/L    ALT 10 <35 U/L    Alkaline Phosphatase 53 38 - 126 U/L   CBC w/ Differential    Collection Time: 04/04/19  6:27 AM   Result Value Ref Range    WBC 6.3 4.5 - 11.0 10*9/L    RBC 2.83 (L) 4.00 - 5.20 10*12/L    HGB 8.5 (L) 12.0 - 16.0 g/dL    HCT 09.6 (L) 04.5 - 46.0 %    MCV 94.5 80.0 - 100.0 fL    MCH 30.0 26.0 - 34.0 pg    MCHC 31.8 31.0 - 37.0 g/dL    RDW 40.9 (H) 81.1 - 15.0 %    MPV 9.5 7.0 - 10.0 fL    Platelet 430 150 - 440 10*9/L    Neutrophils % 69.2 %  Lymphocytes % 12.1 %    Monocytes % 13.9 %    Eosinophils % 1.5 %    Basophils % 0.3 %    Absolute Neutrophils 4.4 2.0 - 7.5 10*9/L    Absolute Lymphocytes 0.8 (L) 1.5 - 5.0 10*9/L    Absolute Monocytes 0.9 (H) 0.2 - 0.8 10*9/L    Absolute Eosinophils 0.1 0.0 - 0.4 10*9/L    Absolute Basophils 0.0 0.0 - 0.1 10*9/L    Large Unstained Cells 3 0 - 4 % Macrocytosis Slight (A) Not Present    Anisocytosis Slight (A) Not Present    Hypochromasia Marked (A) Not Present   TSH    Collection Time: 04/04/19  6:27 AM   Result Value Ref Range    TSH 2.960 0.600 - 3.300 uIU/mL   POCT Glucose    Collection Time: 04/04/19  8:30 AM   Result Value Ref Range    Glucose, POC 154 70 - 179 mg/dL   POCT Glucose    Collection Time: 04/04/19 12:15 PM   Result Value Ref Range    Glucose, POC 233 (H) 70 - 179 mg/dL       Relevant Studies/Radiology (if blank, then none):  Ecg 12 Lead    Result Date: 04/02/2019  NORMAL SINUS RHYTHM CANNOT RULE OUT ANTERIOR INFARCT  (CITED ON OR BEFORE 05-Mar-2019) ABNORMAL ECG WHEN COMPARED WITH ECG OF 06-Mar-2019 14:34, NONSPECIFIC T WAVE ABNORMALITY NO LONGER EVIDENT IN ANTERIOR LEADS Confirmed by Warnell Forester ([id number]) on 04/02/2019 2:09:36 PM    Xr Chest Portable    Result Date: 04/02/2019  EXAM: XR CHEST PORTABLE DATE: 04/01/2018 ACCESSION: [id number] UN DICTATED: 04/02/2019 8:10 AM INTERPRETATION LOCATION: [facility] CLINICAL INDICATION: 59 years old Female with Left sided thoracentesis ; OTHER  COMPARISON: Chest radiograph 04/01/2019 prior TECHNIQUE: Portable Chest Radiograph.     Left mid and lower lung opacification, likely atelectasis. Small left pleural effusion, significantly decreased in size from prior. No pneumothorax. Stable cardiomediastinal silhouette. Stable left glenoid metastatic lesion.      Xr Chest 1 View    Result Date: 04/01/2019  EXAM: XR CHEST 1 VIEW DATE: ACCESSION: [id number] UN DICTATED: 04/01/2019 12:53 PM INTERPRETATION LOCATION: Main [facility] CLINICAL INDICATION: 59 years old Female with SHORTNESS OF BREATH  COMPARISON: 03/05/2019. TECHNIQUE: Portable Chest Radiograph. FINDINGS: Increased size of large left pleural effusion with mostly collapsed left lung. No pneumothorax. Multiple small right lung pulmonary nodules. Cardiomediastinal silhouette partially obscured. Increased size of large left pleural effusion and associated lung collapse.    ______________________________________________________________________  Discharge Instructions:     Activity Instructions     Activity as tolerated                Other Instructions     Call MD for:  difficulty breathing, headache or visual disturbances      Call MD for:  persistent nausea or vomiting      Call MD for:  severe uncontrolled pain      Call MD for:  temperature >38.5 Celsius      Discharge instructions      It was a pleasure taking care of you!    You were admitted to [facility] for difficulty breathing, found to have a large left malignant pleural effusion.    Follow-up Plan after discharge:  Issues related to hospitalization: hypoxia, constipation, and depression  Follow-up appointment for lab draws: On 04/12/19 at 9 AM  Follow-up appointment with [facility]: Alveda Reasons, NP on 04/08/19 at 10:30 AM  Oncology  specific plans going forward: TBD    During this COVID-19 outbreak, please avoid and or minimize close contact with members outside of your immediate family. If you must be in public wear a mask and remain 6 feet from others.  Avoid large crowds or situations with close contact of multiple people. REMEMBER to wash your hands frequently and increase this frequency when in public.    Primary provider to be contacted as needed: Dr. Archie Balboa at the [facility] communication center: [phone] Please make sure you have a functioning thermometer at home.?? If you are feeling poorly, especially if you have chills, shaking, muscle aches or lightheadedness, measure your temperature. If it is more than 100.5 Farenheit, call the nurse triage line during daytime hours (Monday through Friday 8AM - 5PM: [phone]) or on nights and weekends, the on-call doctor by calling the hospital operator [phone]) and asking for the on-call adult oncologist. Alternatively, if you develop a fever and you are currently taking or have recently received chemotherapy, you may proceed directly to your local emergency room as this??can be a medical emergency. Be sure to inform the emergency room provider if you have recently received chemotherapy. You may have blood drawn for blood cultures and receive IV antibiotics.    Following discharge from the hospital, if you develop or notice worsening of any symptoms such as nausea, vomiting, chest pain, shortness of breath, fevers, or chills, please return to the emergency department.??     If you develop these symptoms, or if you have trouble obtaining any of your medications you may call the [facility] Communication Center to speak with the triage team at [phone] if Monday through Friday 8am-5pm or call [phone] after hours.      For appointments & questions Monday through Friday 8 AM - 5 PM   please call [phone] or toll free [phone].    On Nights, Weekends and Holidays  Call [phone] and ask for the oncologist on call.    [facility]  [address]  [place], [place] [address]  [email or web address] COVID-19 is a new challenge, but [facility] is dedicated to providing you and your loved ones with the best possible cancer care and support in the safest way possible during this time. We made two videos about the ways we are working to keep you safe, such as offering the option to visit your care team over the phone or through a video, as well as support services offered for our patients and their caregivers. If you have any questions about your cancer care, please call your care team.  ??  Video #1: Keeping [facility] patients safe during the COVID-19 crisis  [email or web address].eabjmlille.com  ??  Video #2: Support for cancer patients and their caregivers during the COVID-19 pandemic  http://go.[email or web address].uy               Follow Up instructions and Outpatient Referrals     Ambulatory referral to Home Health      Is this a [facility] referral?: No    Physician to follow patient's care: PCP    Disciplines requested:  Nursing  Physical Therapy  Medical Social Work       Nursing requested: Teaching/skilled observation and assessment    What teaching is needed (new diagnosis? new medications?): Medication management and reconciliation, disease process teaching, VS monitoring    Physical Therapy requested: Evaluate and treat    Medical Social Work requested: Other (  please enter in comments) Comment - Assistance with mental health services and long-term planning    Requested start of care date: Routine (within 48 hours)    Call MD for:  difficulty breathing, headache or visual disturbances      Call MD for:  persistent nausea or vomiting      Call MD for:  severe uncontrolled pain      Call MD for:  temperature >38.5 Celsius      Discharge instructions            Appointments which have been scheduled for you    Apr 08, 2019  9:30 AM  (Arrive by 9:00 AM)  NURSE LAB DRAW with ADULT ONC LAB [facility] ADULT ONCOLOGY LAB DRAW STATION [facility] REGION) [address]  [place] [address]  [phone]      Apr 08, 2019 10:30 AM  (Arrive by 10:00 AM)  RETURN VIDEO - OTHER with Corrine Tasia Catchings, AGNP  [place] HEMATOLOGY ONCOLOGY 2ND FLR CANCER HOSP [facility] REGION) [address]  [place] [address]  [phone]      Apr 08, 2019 11:30 AM  (Arrive by 11:00 AM)  LEVEL 120 with [employer] CHAIR 06  [place] ONCOLOGY INFUSION [facility] REGION) [address]  [place] [address]  [phone]      Apr 10, 2019 11:15 AM  RETURN  ONCOLOGY with Cherlynn Perches, MD  [facility] ORTHOPAEDICS [facility] REGION) [address]  [place] [address]  [phone]      Apr 12, 2019  9:00 AM  (Arrive by 8:45 AM)  LAB ONLY with HB LAB WATER 460  LAB PHLEB [place] [facility] REGION) [address]  [place] [address]  [phone]      Apr 12, 2019 10:00 AM  (Arrive by 9:45 AM)  NURSE VISIT with ONCINF HMOB LABS  [facility]COLOGY [facility] REGION) [address]  [place] [address]  [phone]      Apr 17, 2019 12:15 PM  COVID PRE TEST with [facility] COVID PRE TEST [facility] PROVIDER  RESPIRATORY DIAGNOSTIC CENTER ACC [facility] REGION) [address]  [place] [address]  [phone]      Apr 19, 2019  COLONOSCOPY, FLEXIBLE, PROXIMAL TO SPLENIC FLEXURE; DIAGNOSTIC, W/WO COLLECTION SPECIMEN BY BRUSH OR WASH with Leland Her, MD  [facility] GI PERIOP WATER [address] [facility] REGION) [address]  [place] [address]  [phone]   Apr 29, 2019 11:00 AM  (Arrive by 10:45 AM)  RETURN  GENERAL with Neysa Hotter, MD  [place] NEPHROLOGY [facility] REGION) [address]  [place] [address]  [phone]      Apr 30, 2019 12:40 PM (Arrive by 12:10 PM)  NEW GENERAL PCP with Beverly Milch, MD  [facility] REGION) [address]  [place] [address]  [phone]           ______________________________________________________________________  Discharge Day Services:  BP 145/67  - Pulse 90  - Temp 37.1 ??C (Oral)  - Resp 22  - Wt 83.7 kg (184 lb 8 oz)  - SpO2 94%  - BMI 33.75 kg/m??   Pt seen on the day of discharge and determined appropriate for discharge.  Condition at Discharge: stable    Length of Discharge: I spent less than 30 mins in the discharge of this patient.

## 2019-04-04 NOTE — Unmapped (Signed)
Patient w/ no acute events this shift. Oxycodone given once for R ankle/leg and r-sided back pain, with good effect. Oxygen stable on room air at rest. Requires 2L [place] when ambulating. Will D/C home this afternoon. Will continue to monitor.    Problem: Adult Inpatient Plan of Care  Goal: Plan of Care Review  Outcome: Ongoing - Unchanged  Goal: Patient-Specific Goal (Individualization)  Outcome: Ongoing - Unchanged  Goal: Absence of Hospital-Acquired Illness or Injury  Outcome: Ongoing - Unchanged  Goal: Optimal Comfort and Wellbeing  Outcome: Ongoing - Unchanged  Goal: Readiness for Transition of Care  Outcome: Ongoing - Unchanged  Goal: Rounds/Family Conference  Outcome: Ongoing - Unchanged

## 2019-04-04 NOTE — Unmapped (Signed)
RT referral received and assessment process initiated with review of chart and discussion with tx team. Per discussion with RN, pt with plans to discharge tomorrow. Order received to address depressive symptoms however psych is currently following, so RT will discharge tx services at this time. Please call [phone] for questions.     Thanks,  Duane Lope, LRT/CTRS

## 2019-04-04 NOTE — Unmapped (Signed)
[facility]   Comprehensive Cancer Support Program       THIS IS A [place]      Date: April 04, 2019    Service requesting consult:  BMTU    Requesting Provider:  Napoleon Form, MD    Location of patient:  Inpatient    Consulting Provider:  Perry Mount, MS    Reason for contact:  Depression        This patient was not seen in person. Support services through the Comprehensive Cancer Support Program have moved to a virtual model to minimize potential spread of COVID-19, protect patients/providers and reduce PPE utilization.  Support services are provided only via video or telephone visits at this time.      Alexandra Richards  is a 59 y.o., female with a history of metastatic breast cancer. She was seen by Psychiatry for depression during most recent hospitial admission. [facility] received referral for outpatient psychiatry. Patient will be contacted to schedule the appointment as requested.       Perry Mount, MS  Counselor  Comprehensive Cancer Support Program  [phone]      THIS IS A NON-BILLABLE ENCOUNTER

## 2019-04-04 NOTE — Unmapped (Signed)
Vss. No falls this shift. Call bell within reach. Resting throughout shift. Reports pain that is controlled with PRN oxycodone. 2 liter fluid restriction. Will ctm.  Problem: Adult Inpatient Plan of Care  Goal: Plan of Care Review  Outcome: Ongoing - Unchanged  Goal: Patient-Specific Goal (Individualization)  Outcome: Ongoing - Unchanged  Goal: Absence of Hospital-Acquired Illness or Injury  Outcome: Ongoing - Unchanged  Goal: Optimal Comfort and Wellbeing  Outcome: Ongoing - Unchanged  Goal: Readiness for Transition of Care  Outcome: Ongoing - Unchanged  Goal: Rounds/Family Conference  Outcome: Ongoing - Unchanged

## 2019-04-05 MED ORDER — MULTIVITAMIN-IRON 9 MG-FOLIC ACID 400 MCG-CALCIUM AND MINERALS TABLET
ORAL_TABLET | Freq: Every day | ORAL | 0 refills | 130 days | Status: CP
Start: 2019-04-05 — End: ?
  Filled 2019-04-04: qty 130, 130d supply, fill #0

## 2019-04-08 ENCOUNTER — Other Ambulatory Visit
Admit: 2019-04-08 | Discharge: 2019-04-11 | Disposition: A | Discharge status: Home Health | Payer: MEDICAID | Admitting: Hematology & Oncology

## 2019-04-08 ENCOUNTER — Ambulatory Visit
Admit: 2019-04-08 | Discharge: 2019-04-11 | Disposition: A | Discharge status: Home Health | Payer: MEDICAID | Admitting: Hematology & Oncology

## 2019-04-08 ENCOUNTER — Telehealth
Admit: 2019-04-08 | Discharge: 2019-04-11 | Disposition: A | Discharge status: Home Health | Payer: MEDICAID | Attending: Adult Health | Admitting: Hematology & Oncology

## 2019-04-08 ENCOUNTER — Encounter
Admit: 2019-04-08 | Discharge: 2019-04-11 | Disposition: A | Discharge status: Home Health | Payer: MEDICAID | Admitting: Hematology & Oncology

## 2019-04-08 DIAGNOSIS — C7951 Secondary malignant neoplasm of bone: Secondary | ICD-10-CM

## 2019-04-08 DIAGNOSIS — C50919 Malignant neoplasm of unspecified site of unspecified female breast: Principal | ICD-10-CM

## 2019-04-08 DIAGNOSIS — C50312 Malignant neoplasm of lower-inner quadrant of left female breast: Principal | ICD-10-CM

## 2019-04-08 DIAGNOSIS — Z171 Estrogen receptor negative status [ER-]: Principal | ICD-10-CM

## 2019-04-08 LAB — BASIC METABOLIC PANEL
ANION GAP: 12 mmol/L (ref 7–15)
BLOOD UREA NITROGEN: 40 mg/dL — ABNORMAL HIGH (ref 7–21)
BUN / CREAT RATIO: 18
CALCIUM: 9.2 mg/dL (ref 8.5–10.2)
EGFR CKD-EPI AA FEMALE: 28 mL/min/{1.73_m2} — ABNORMAL LOW (ref >=60)
EGFR CKD-EPI NON-AA FEMALE: 24 mL/min/{1.73_m2} — ABNORMAL LOW (ref >=60)
GLUCOSE RANDOM: 196 mg/dL — ABNORMAL HIGH (ref 70–179)
POTASSIUM: 6.9 mmol/L (ref 3.5–5.0)
SODIUM: 133 mmol/L — ABNORMAL LOW (ref 135–145)

## 2019-04-08 LAB — URINALYSIS WITH CULTURE REFLEX
BILIRUBIN UA: NEGATIVE
BLOOD UA: NEGATIVE
GLUCOSE UA: 50 — AB
KETONES UA: NEGATIVE
NITRITE UA: NEGATIVE
PH UA: 6 (ref 5.0–9.0)
PROTEIN UA: 100 — AB
RBC UA: 1 /HPF (ref <=4)
SPECIFIC GRAVITY UA: 1.008 (ref 1.003–1.030)
SQUAMOUS EPITHELIAL: 8 /HPF — ABNORMAL HIGH (ref 0–5)
TRANSITIONAL EPITHELIAL: 1 /HPF (ref 0–2)
UROBILINOGEN UA: 0.2

## 2019-04-08 LAB — RED BLOOD CELL COUNT: Lab: 3.03 — ABNORMAL LOW

## 2019-04-08 LAB — CBC W/ AUTO DIFF
BASOPHILS ABSOLUTE COUNT: 0 10*9/L (ref 0.0–0.1)
BASOPHILS ABSOLUTE COUNT: 0 10*9/L (ref 0.0–0.1)
BASOPHILS RELATIVE PERCENT: 0.5 %
BASOPHILS RELATIVE PERCENT: 0.3 %
EOSINOPHILS ABSOLUTE COUNT: 0.2 10*9/L (ref 0.0–0.4)
EOSINOPHILS ABSOLUTE COUNT: 0.1 10*9/L (ref 0.0–0.4)
EOSINOPHILS RELATIVE PERCENT: 1.7 %
EOSINOPHILS RELATIVE PERCENT: 0.8 %
HEMATOCRIT: 28 % — ABNORMAL LOW (ref 36.0–46.0)
HEMATOCRIT: 31.8 % — ABNORMAL LOW (ref 36.0–46.0)
HEMOGLOBIN: 8.9 g/dL — ABNORMAL LOW (ref 12.0–16.0)
HEMOGLOBIN: 9.8 g/dL — ABNORMAL LOW (ref 12.0–16.0)
LARGE UNSTAINED CELLS: 1 % (ref 0–4)
LYMPHOCYTES ABSOLUTE COUNT: 0.8 10*9/L — ABNORMAL LOW (ref 1.5–5.0)
LYMPHOCYTES ABSOLUTE COUNT: 0.9 10*9/L — ABNORMAL LOW (ref 1.5–5.0)
LYMPHOCYTES RELATIVE PERCENT: 9 %
LYMPHOCYTES RELATIVE PERCENT: 7.2 %
MEAN CORPUSCULAR HEMOGLOBIN CONC: 31.8 g/dL (ref 31.0–37.0)
MEAN CORPUSCULAR HEMOGLOBIN CONC: 30.8 g/dL — ABNORMAL LOW (ref 31.0–37.0)
MEAN CORPUSCULAR HEMOGLOBIN: 29.4 pg (ref 26.0–34.0)
MEAN CORPUSCULAR HEMOGLOBIN: 28.9 pg (ref 26.0–34.0)
MEAN CORPUSCULAR VOLUME: 92.4 fL (ref 80.0–100.0)
MEAN CORPUSCULAR VOLUME: 93.6 fL (ref 80.0–100.0)
MEAN PLATELET VOLUME: 7.9 fL (ref 7.0–10.0)
MEAN PLATELET VOLUME: 8.3 fL (ref 7.0–10.0)
MONOCYTES ABSOLUTE COUNT: 0.8 10*9/L (ref 0.2–0.8)
MONOCYTES ABSOLUTE COUNT: 0.6 10*9/L (ref 0.2–0.8)
MONOCYTES RELATIVE PERCENT: 8.3 %
MONOCYTES RELATIVE PERCENT: 4.8 %
NEUTROPHILS ABSOLUTE COUNT: 7.2 10*9/L (ref 2.0–7.5)
NEUTROPHILS ABSOLUTE COUNT: 10.8 10*9/L — ABNORMAL HIGH (ref 2.0–7.5)
NEUTROPHILS RELATIVE PERCENT: 78.5 %
NEUTROPHILS RELATIVE PERCENT: 86.2 %
PLATELET COUNT: 569 10*9/L — ABNORMAL HIGH (ref 150–440)
PLATELET COUNT: 574 10*9/L — ABNORMAL HIGH (ref 150–440)
RED BLOOD CELL COUNT: 3.03 10*12/L — ABNORMAL LOW (ref 4.00–5.20)
RED BLOOD CELL COUNT: 3.39 10*12/L — ABNORMAL LOW (ref 4.00–5.20)
RED CELL DISTRIBUTION WIDTH: 17.6 % — ABNORMAL HIGH (ref 12.0–15.0)
RED CELL DISTRIBUTION WIDTH: 17.1 % — ABNORMAL HIGH (ref 12.0–15.0)
WBC ADJUSTED: 12.5 10*9/L — ABNORMAL HIGH (ref 4.5–11.0)

## 2019-04-08 LAB — COMPREHENSIVE METABOLIC PANEL
ALBUMIN: 3.6 g/dL (ref 3.5–5.0)
ALKALINE PHOSPHATASE: 81 U/L (ref 38–126)
ALT (SGPT): 25 U/L (ref <35)
AST (SGOT): 61 U/L — ABNORMAL HIGH (ref 14–38)
BILIRUBIN TOTAL: 0.9 mg/dL (ref 0.0–1.2)
BLOOD UREA NITROGEN: 41 mg/dL — ABNORMAL HIGH (ref 7–21)
BUN / CREAT RATIO: 19
CALCIUM: 9.3 mg/dL (ref 8.5–10.2)
CHLORIDE: 97 mmol/L — ABNORMAL LOW (ref 98–107)
CREATININE: 2.16 mg/dL — ABNORMAL HIGH (ref 0.60–1.00)
EGFR CKD-EPI AA FEMALE: 28 mL/min/{1.73_m2} — ABNORMAL LOW (ref >=60)
EGFR CKD-EPI NON-AA FEMALE: 24 mL/min/{1.73_m2} — ABNORMAL LOW (ref >=60)
GLUCOSE RANDOM: 248 mg/dL — ABNORMAL HIGH (ref 70–179)
POTASSIUM: 6.2 mmol/L — ABNORMAL HIGH (ref 3.5–5.0)
PROTEIN TOTAL: 7.5 g/dL (ref 6.5–8.3)
SODIUM: 129 mmol/L — ABNORMAL LOW (ref 135–145)

## 2019-04-08 LAB — POTASSIUM
Potassium:SCnc:Pt:Ser/Plas:Qn:: 5.7 — ABNORMAL HIGH
Potassium:SCnc:Pt:Ser/Plas:Qn:: 6 — ABNORMAL HIGH

## 2019-04-08 LAB — MAGNESIUM: Magnesium:MCnc:Pt:Ser/Plas:Qn:: 2.4 — ABNORMAL HIGH

## 2019-04-08 LAB — ANION GAP
Anion gap 3:SCnc:Pt:Ser/Plas:Qn:: 11
Anion gap 3:SCnc:Pt:Ser/Plas:Qn:: 12

## 2019-04-08 LAB — HEPATIC FUNCTION PANEL
ALBUMIN: 3.5 g/dL (ref 3.5–5.0)
ALT (SGPT): 17 U/L (ref <35)
AST (SGOT): 35 U/L (ref 14–38)
BILIRUBIN DIRECT: 0.3 mg/dL (ref 0.00–0.40)
BILIRUBIN TOTAL: 0.6 mg/dL (ref 0.0–1.2)
PROTEIN TOTAL: 7 g/dL (ref 6.5–8.3)

## 2019-04-08 LAB — TROPONIN I: Troponin I.cardiac:MCnc:Pt:Ser/Plas:Qn:: <0.034

## 2019-04-08 LAB — PH UA: pH:LsCnc:Pt:Urine:Qn:Test strip: 6

## 2019-04-08 LAB — EOSINOPHILS ABSOLUTE COUNT: Eosinophils:NCnc:Pt:Bld:Qn:Automated count: 0.1

## 2019-04-08 LAB — GLUCOSE RANDOM: Glucose:MCnc:Pt:Ser/Plas:Qn:: 337 — ABNORMAL HIGH

## 2019-04-08 LAB — ALKALINE PHOSPHATASE: Alkaline phosphatase:CCnc:Pt:Ser/Plas:Qn:: 59

## 2019-04-08 MED ORDER — SODIUM ZIRCONIUM CYCLOSILICATE 10 GRAM ORAL POWDER PACKET
PACK | Freq: Three times a day (TID) | ORAL | 0 refills | 2.00000 days | Status: CP
Start: 2019-04-08 — End: ?

## 2019-04-08 NOTE — Unmapped (Signed)
It was a pleasure to see you today in the Medical Oncology Clinic.    Please call our nurse navigator, Modena Jansky, if you have any interval questions or concerns:      Modena Jansky, RN: phone: [phone]     Prescription refills: [phone]     FAX: [phone]   ----------------------------------------------------------------------------------------------------  For health related questions call:   Please call NURSE TRIAGE LINE at [phone]    For appointments & questions Monday through Friday 8 AM???5 PM     Please call [phone] or Toll free [phone]    On Lovenia Kim and Holidays  Call [phone] and ask for the oncologist on call    For emergencies, evenings or weekends, please call [phone] and ask for the oncology fellow on call.     Reasons to call emergency line may include:   Fever of 100.5 or greater   Nausea and/or vomiting not relieved with nausea medicine   Diarrhea or constipation not relieved with bowel regimen   Severe pain not relieved with usual pain regimen     --------------------------------------------------------------------------------------------------------  Given the rapidly spreading coronavirus (COVID-19), we recommended you avoid travel and crowds, and wash your hands frequently.     If you develop fever, cough, shortness of breath and/or have known exposure (close contact < 6 feet) with someone who has tested positive please call the [facility] Coronavirus Helpline at [phone].         Carlos American, DNP, NP-C  Breast Medical Oncology  [facility] Hematology/Oncology  [address]  [place], [place] [address]

## 2019-04-08 NOTE — Unmapped (Signed)
Order was placed for a PIV by Venous Access Team (VAT).  Patient was assessed at bedside for placement of a PIV. Mask and protective eyewear were donned. Access was obtained. Blood return noted.  Dressing intact and device well secured.  Flushed with normal saline.  Pt advised to inform RN of any s/s of discomfort at the PIV site.    Workup / Procedure Time:  15 minutes       primary care  RN was notified.       Thank you,     Areatha Keas RN Venous Access Team

## 2019-04-08 NOTE — Unmapped (Unsigned)
Pt no longer taking Capecitabine.  Changing to IV chemotherapy.

## 2019-04-08 NOTE — Unmapped (Signed)
Bed: 76-D  Expected date:   Expected time:   Means of arrival:   Comments:  resus if needed

## 2019-04-08 NOTE — Unmapped (Signed)
Pharmacy: First Cycle Chemotherapy Patient Education    Chemotherapy regimen/agents: Paclitaxel (weekly)  Venous Access: peripheral   Caregivers present for education: None    Alexandra Richards is a 59 year old with metastatic breast cancer. Chemotherapy education was provided to the patient by an oncology pharmacist.      Side effects discussed included but were not limited to:   complications associated with myelosuppresion (such as infection/fever, fatigue, and bleeding), nausea/vomiting, diarrhea, constipation, mucositis, taste changes, flu-like symptoms, hair loss, skin/nail changes, peripheral neuropathy, hepatotoxicity or fatigue.  ?? Patient endorses that she already take ondansetron at home for baseline nausea. I instructed her to contact us if her nausea worsens but that this medication has a low emetic risk.    The [facility] Psychiatric patient handout or the Hematology/Oncology Fellow on-call phone number for concerns after hours were given.The patient verbalized understanding of this information.  Medication reconciliation was completed and the medication list was updated in EPIC.      Medication reconciliation completed.     Approximate time spent with patient: 30  Minutes.    Ezekiel Ina, PharmD  PGY2 Oncology Pharmacy Resident   Pager ID: [phone]

## 2019-04-08 NOTE — Unmapped (Signed)
Patient arrived in NAD, labs reviewed. Lactulose and insulin administered per orders for hyperkalemia. Orders released.Taxol initiated at 1512. At approximately 1517, pt's son emerged from room yelling for help, stating that the pt can't breathe. Pt very short of breath on assessment, found to have a thready pulse. APPs at bedside. Code team called. Emergency medications released and administered. Pt transported to emergency room with code team.

## 2019-04-09 NOTE — Unmapped (Signed)
Pt presents as code blue from infusion clinic. Pt received first infusion of Chemo meds and had brief period of apnea and pulseless ness. Upon awakening Pt received reaction meds. Pt  Received reaction meds. Of note K was elecvated upon arrival and received insulin, D50, and lactulose. Vomiting on arrival

## 2019-04-11 MED ORDER — INSULIN HUMAN U-100 NPH-REGULR 70-30 MIX 100 UNIT/ML SUBCUTANEOUS SUSP
Freq: Two times a day (BID) | SUBCUTANEOUS | 0 refills | 25 days | Status: CP
Start: 2019-04-11 — End: 2019-05-11
  Filled 2019-04-11: qty 10, 25d supply, fill #0

## 2019-04-11 MED ORDER — ONDANSETRON 4 MG DISINTEGRATING TABLET
ORAL_TABLET | Freq: Four times a day (QID) | ORAL | 0 refills | 8.00000 days | Status: CP | PRN
Start: 2019-04-11 — End: 2019-05-11
  Filled 2019-04-11: qty 30, 8d supply, fill #0

## 2019-04-11 MED ORDER — MIRTAZAPINE 30 MG TABLET
ORAL_TABLET | Freq: Every evening | ORAL | 0 refills | 30 days | Status: CP
Start: 2019-04-11 — End: 2019-05-11
  Filled 2019-04-11: qty 30, 30d supply, fill #0

## 2019-04-11 MED FILL — HUMULIN 70/30 U-100 INSULIN 100 UNIT/ML SUBCUTANEOUS SUSPENSION: 25 days supply | Qty: 10 | Fill #0 | Status: AC

## 2019-04-11 MED FILL — ONDANSETRON 4 MG DISINTEGRATING TABLET: 8 days supply | Qty: 30 | Fill #0 | Status: AC

## 2019-04-11 MED FILL — MIRTAZAPINE 30 MG TABLET: 30 days supply | Qty: 30 | Fill #0 | Status: AC

## 2019-04-16 ENCOUNTER — Encounter
Admit: 2019-04-16 | Discharge: 2019-04-17 | Payer: MEDICAID | Attending: Geriatric Medicine | Primary: Geriatric Medicine

## 2019-04-16 DIAGNOSIS — C50919 Malignant neoplasm of unspecified site of unspecified female breast: Principal | ICD-10-CM

## 2019-04-16 DIAGNOSIS — C7951 Secondary malignant neoplasm of bone: Secondary | ICD-10-CM

## 2019-04-17 ENCOUNTER — Encounter: Admit: 2019-04-17 | Discharge: 2019-04-18 | Payer: MEDICAID | Attending: Family | Primary: Family

## 2019-04-18 ENCOUNTER — Ambulatory Visit: Admit: 2019-04-18 | Discharge: 2019-04-19 | Payer: MEDICAID | Attending: Adult Health | Primary: Adult Health

## 2019-04-18 ENCOUNTER — Encounter: Admit: 2019-04-18 | Discharge: 2019-04-19 | Payer: MEDICAID

## 2019-04-18 DIAGNOSIS — Z7189 Other specified counseling: Principal | ICD-10-CM

## 2019-04-18 DIAGNOSIS — C50919 Malignant neoplasm of unspecified site of unspecified female breast: Principal | ICD-10-CM

## 2019-04-18 DIAGNOSIS — F329 Major depressive disorder, single episode, unspecified: Principal | ICD-10-CM

## 2019-04-19 ENCOUNTER — Encounter: Admit: 2019-04-19 | Discharge: 2019-04-20 | Payer: MEDICAID

## 2019-04-19 DIAGNOSIS — C50919 Malignant neoplasm of unspecified site of unspecified female breast: Principal | ICD-10-CM

## 2019-04-19 DIAGNOSIS — C7951 Secondary malignant neoplasm of bone: Principal | ICD-10-CM

## 2019-04-22 ENCOUNTER — Other Ambulatory Visit: Admit: 2019-04-22 | Discharge: 2019-04-23 | Payer: MEDICAID

## 2019-04-22 ENCOUNTER — Ambulatory Visit: Admit: 2019-04-22 | Discharge: 2019-04-23 | Payer: MEDICAID

## 2019-04-22 DIAGNOSIS — C50312 Malignant neoplasm of lower-inner quadrant of left female breast: Principal | ICD-10-CM

## 2019-04-22 DIAGNOSIS — C50919 Malignant neoplasm of unspecified site of unspecified female breast: Principal | ICD-10-CM

## 2019-04-22 DIAGNOSIS — C7951 Secondary malignant neoplasm of bone: Principal | ICD-10-CM

## 2019-04-22 DIAGNOSIS — Z171 Estrogen receptor negative status [ER-]: Principal | ICD-10-CM

## 2019-04-22 MED ORDER — OXYCODONE 10 MG TABLET
ORAL_TABLET | Freq: Four times a day (QID) | ORAL | 0 refills | 15 days | Status: CP | PRN
Start: 2019-04-22 — End: ?

## 2019-04-22 MED ORDER — PROCHLORPERAZINE MALEATE 10 MG TABLET
ORAL_TABLET | Freq: Four times a day (QID) | ORAL | 2 refills | 8.00000 days | Status: CP | PRN
Start: 2019-04-22 — End: ?

## 2019-04-23 ENCOUNTER — Encounter: Admit: 2019-04-23 | Discharge: 2019-04-24 | Payer: MEDICAID

## 2019-04-23 DIAGNOSIS — F324 Major depressive disorder, single episode, in partial remission: Principal | ICD-10-CM

## 2019-04-29 ENCOUNTER — Other Ambulatory Visit: Admit: 2019-04-29 | Discharge: 2019-04-29 | Payer: MEDICAID

## 2019-04-29 ENCOUNTER — Encounter: Admit: 2019-04-29 | Discharge: 2019-04-29 | Payer: MEDICAID | Attending: Nephrology | Primary: Nephrology

## 2019-04-29 ENCOUNTER — Encounter: Admit: 2019-04-29 | Discharge: 2019-04-29 | Payer: MEDICAID

## 2019-04-29 DIAGNOSIS — Z171 Estrogen receptor negative status [ER-]: Principal | ICD-10-CM

## 2019-04-29 DIAGNOSIS — C50312 Malignant neoplasm of lower-inner quadrant of left female breast: Principal | ICD-10-CM

## 2019-04-30 ENCOUNTER — Telehealth: Admit: 2019-04-30 | Discharge: 2019-05-01 | Payer: MEDICAID

## 2019-05-06 ENCOUNTER — Encounter: Admit: 2019-05-06 | Discharge: 2019-05-06 | Payer: MEDICAID

## 2019-05-06 ENCOUNTER — Ambulatory Visit: Admit: 2019-05-06 | Discharge: 2019-05-06 | Payer: MEDICAID

## 2019-05-06 DIAGNOSIS — C50919 Malignant neoplasm of unspecified site of unspecified female breast: Principal | ICD-10-CM

## 2019-05-06 DIAGNOSIS — C7951 Secondary malignant neoplasm of bone: Secondary | ICD-10-CM

## 2019-05-13 ENCOUNTER — Encounter: Admit: 2019-05-13 | Discharge: 2019-05-13 | Payer: MEDICAID

## 2019-05-13 ENCOUNTER — Ambulatory Visit
Admit: 2019-05-13 | Discharge: 2019-05-13 | Payer: MEDICAID | Attending: Geriatric Medicine | Primary: Geriatric Medicine

## 2019-05-13 ENCOUNTER — Ambulatory Visit: Admit: 2019-05-13 | Discharge: 2019-05-13 | Payer: MEDICAID

## 2019-05-13 DIAGNOSIS — C50919 Malignant neoplasm of unspecified site of unspecified female breast: Principal | ICD-10-CM

## 2019-05-13 DIAGNOSIS — C7951 Secondary malignant neoplasm of bone: Principal | ICD-10-CM

## 2019-05-13 DIAGNOSIS — E875 Hyperkalemia: Principal | ICD-10-CM

## 2019-05-13 MED ORDER — OXYCODONE 10 MG TABLET
ORAL_TABLET | Freq: Four times a day (QID) | ORAL | 0 refills | 15.00000 days | Status: CP | PRN
Start: 2019-05-13 — End: ?

## 2019-05-21 ENCOUNTER — Encounter: Admit: 2019-05-21 | Discharge: 2019-05-22 | Payer: MEDICAID

## 2019-05-21 ENCOUNTER — Encounter
Admit: 2019-05-21 | Discharge: 2019-05-22 | Payer: MEDICAID | Attending: Geriatric Medicine | Primary: Geriatric Medicine

## 2019-05-21 DIAGNOSIS — F332 Major depressive disorder, recurrent severe without psychotic features: Principal | ICD-10-CM

## 2019-05-26 ENCOUNTER — Inpatient Hospital Stay
Admission: EM | Admit: 2019-05-26 | Discharge: 2019-05-29 | DRG: 181 | Disposition: A | Discharge status: Home Health | Payer: Medicaid Other | Attending: Internal Medicine | Admitting: Internal Medicine

## 2019-05-26 ENCOUNTER — Other Ambulatory Visit: Payer: Self-pay

## 2019-05-26 ENCOUNTER — Encounter: Payer: Self-pay | Admitting: Emergency Medicine

## 2019-05-26 ENCOUNTER — Emergency Department: Payer: Medicaid Other

## 2019-05-26 DIAGNOSIS — N179 Acute kidney failure, unspecified: Secondary | ICD-10-CM | POA: Diagnosis present

## 2019-05-26 DIAGNOSIS — C7951 Secondary malignant neoplasm of bone: Secondary | ICD-10-CM | POA: Diagnosis present

## 2019-05-26 DIAGNOSIS — N189 Chronic kidney disease, unspecified: Secondary | ICD-10-CM

## 2019-05-26 DIAGNOSIS — Z20822 Contact with and (suspected) exposure to covid-19: Secondary | ICD-10-CM | POA: Diagnosis present

## 2019-05-26 DIAGNOSIS — D84821 Immunodeficiency due to drugs: Secondary | ICD-10-CM | POA: Diagnosis present

## 2019-05-26 DIAGNOSIS — T451X5A Adverse effect of antineoplastic and immunosuppressive drugs, initial encounter: Secondary | ICD-10-CM | POA: Diagnosis present

## 2019-05-26 DIAGNOSIS — E1122 Type 2 diabetes mellitus with diabetic chronic kidney disease: Secondary | ICD-10-CM | POA: Diagnosis present

## 2019-05-26 DIAGNOSIS — J918 Pleural effusion in other conditions classified elsewhere: Secondary | ICD-10-CM | POA: Diagnosis present

## 2019-05-26 DIAGNOSIS — J159 Unspecified bacterial pneumonia: Secondary | ICD-10-CM | POA: Diagnosis present

## 2019-05-26 DIAGNOSIS — J189 Pneumonia, unspecified organism: Secondary | ICD-10-CM

## 2019-05-26 DIAGNOSIS — C7801 Secondary malignant neoplasm of right lung: Principal | ICD-10-CM | POA: Diagnosis present

## 2019-05-26 DIAGNOSIS — N1832 Chronic kidney disease, stage 3b: Secondary | ICD-10-CM | POA: Diagnosis present

## 2019-05-26 DIAGNOSIS — I130 Hypertensive heart and chronic kidney disease with heart failure and stage 1 through stage 4 chronic kidney disease, or unspecified chronic kidney disease: Secondary | ICD-10-CM | POA: Diagnosis present

## 2019-05-26 DIAGNOSIS — T501X5A Adverse effect of loop [high-ceiling] diuretics, initial encounter: Secondary | ICD-10-CM | POA: Diagnosis present

## 2019-05-26 DIAGNOSIS — D649 Anemia, unspecified: Secondary | ICD-10-CM

## 2019-05-26 DIAGNOSIS — J90 Pleural effusion, not elsewhere classified: Secondary | ICD-10-CM | POA: Diagnosis not present

## 2019-05-26 DIAGNOSIS — C50919 Malignant neoplasm of unspecified site of unspecified female breast: Secondary | ICD-10-CM | POA: Diagnosis not present

## 2019-05-26 DIAGNOSIS — E119 Type 2 diabetes mellitus without complications: Secondary | ICD-10-CM

## 2019-05-26 DIAGNOSIS — C50912 Malignant neoplasm of unspecified site of left female breast: Secondary | ICD-10-CM | POA: Diagnosis present

## 2019-05-26 DIAGNOSIS — R0602 Shortness of breath: Secondary | ICD-10-CM | POA: Diagnosis not present

## 2019-05-26 DIAGNOSIS — I10 Essential (primary) hypertension: Secondary | ICD-10-CM | POA: Diagnosis not present

## 2019-05-26 DIAGNOSIS — Z794 Long term (current) use of insulin: Secondary | ICD-10-CM | POA: Diagnosis not present

## 2019-05-26 DIAGNOSIS — I5030 Unspecified diastolic (congestive) heart failure: Secondary | ICD-10-CM

## 2019-05-26 DIAGNOSIS — D630 Anemia in neoplastic disease: Secondary | ICD-10-CM | POA: Diagnosis present

## 2019-05-26 DIAGNOSIS — I5032 Chronic diastolic (congestive) heart failure: Secondary | ICD-10-CM | POA: Diagnosis present

## 2019-05-26 LAB — BASIC METABOLIC PANEL
Anion gap: 11 (ref 5–15)
BUN: 41 mg/dL — ABNORMAL HIGH (ref 6–20)
CO2: 21 mmol/L — ABNORMAL LOW (ref 22–32)
Calcium: 8 mg/dL — ABNORMAL LOW (ref 8.9–10.3)
Chloride: 105 mmol/L (ref 98–111)
Creatinine, Ser: 2.32 mg/dL — ABNORMAL HIGH (ref 0.44–1.00)
GFR calc Af Amer: 26 mL/min — ABNORMAL LOW (ref >60)
GFR calc non Af Amer: 22 mL/min — ABNORMAL LOW (ref >60)
Glucose, Bld: 136 mg/dL — ABNORMAL HIGH (ref 70–99)
Potassium: 4.2 mmol/L (ref 3.5–5.1)
Sodium: 137 mmol/L (ref 135–145)

## 2019-05-26 LAB — GLUCOSE, CAPILLARY: Glucose-Capillary: 101 mg/dL — ABNORMAL HIGH (ref 70–99)

## 2019-05-26 LAB — CBC
HCT: 25.9 % — ABNORMAL LOW (ref 36.0–46.0)
Hemoglobin: 7.9 g/dL — ABNORMAL LOW (ref 12.0–15.0)
MCH: 27.2 pg (ref 26.0–34.0)
MCHC: 30.5 g/dL (ref 30.0–36.0)
MCV: 89.3 fL (ref 80.0–100.0)
Platelets: 466 10*3/uL — ABNORMAL HIGH (ref 150–400)
RBC: 2.9 MIL/uL — ABNORMAL LOW (ref 3.87–5.11)
RDW: 17.8 % — ABNORMAL HIGH (ref 11.5–15.5)
WBC: 5.1 10*3/uL (ref 4.0–10.5)
nRBC: 0 % (ref 0.0–0.2)

## 2019-05-26 LAB — PROCALCITONIN: Procalcitonin: 0.1 ng/mL

## 2019-05-26 LAB — LACTIC ACID, PLASMA: Lactic Acid, Venous: 0.8 mmol/L (ref 0.5–1.9)

## 2019-05-26 LAB — POC SARS CORONAVIRUS 2 AG: SARS Coronavirus 2 Ag: NEGATIVE

## 2019-05-26 MED ORDER — ISOSORBIDE MONONITRATE ER 60 MG PO TB24
60.0000 mg | ORAL_TABLET | Freq: Every day | ORAL | Status: DC
  Administered 2019-05-27 – 2019-05-29 (×3): 60 mg via ORAL
  Filled 2019-05-26 (×3): qty 1

## 2019-05-26 MED ORDER — ATORVASTATIN CALCIUM 20 MG PO TABS
40.0000 mg | ORAL_TABLET | Freq: Every day | ORAL | Status: DC
  Administered 2019-05-27 – 2019-05-28 (×3): 40 mg via ORAL
  Filled 2019-05-26 (×3): qty 2

## 2019-05-26 MED ORDER — SODIUM CHLORIDE 0.9 % IV SOLN
1.0000 g | Freq: Once | INTRAVENOUS | Status: AC
  Administered 2019-05-26: 1 g via INTRAVENOUS
  Filled 2019-05-26: qty 10

## 2019-05-26 MED ORDER — CLONIDINE HCL 0.1 MG PO TABS
0.3000 mg | ORAL_TABLET | Freq: Two times a day (BID) | ORAL | Status: DC
  Administered 2019-05-27 – 2019-05-29 (×6): 0.3 mg via ORAL
  Filled 2019-05-26 (×6): qty 3

## 2019-05-26 MED ORDER — AMLODIPINE BESYLATE 10 MG PO TABS
10.0000 mg | ORAL_TABLET | Freq: Every day | ORAL | Status: DC
  Administered 2019-05-27 – 2019-05-29 (×3): 10 mg via ORAL
  Filled 2019-05-26 (×3): qty 1

## 2019-05-26 MED ORDER — SODIUM CHLORIDE 0.9 % IV SOLN
2.0000 g | INTRAVENOUS | Status: DC
  Filled 2019-05-26: qty 2

## 2019-05-26 MED ORDER — MIRTAZAPINE 15 MG PO TABS
30.0000 mg | ORAL_TABLET | Freq: Every day | ORAL | Status: DC
  Administered 2019-05-27 – 2019-05-28 (×3): 30 mg via ORAL
  Filled 2019-05-26 (×3): qty 2

## 2019-05-26 MED ORDER — CARVEDILOL 25 MG PO TABS
25.0000 mg | ORAL_TABLET | Freq: Two times a day (BID) | ORAL | Status: DC
  Administered 2019-05-27 – 2019-05-29 (×6): 25 mg via ORAL
  Filled 2019-05-26: qty 1
  Filled 2019-05-26: qty 2
  Filled 2019-05-26 (×5): qty 1

## 2019-05-26 MED ORDER — SODIUM CHLORIDE 0.9 % IV BOLUS
500.0000 mL | Freq: Once | INTRAVENOUS | Status: AC
  Administered 2019-05-26: 500 mL via INTRAVENOUS

## 2019-05-26 MED ORDER — VITAMIN D 25 MCG (1000 UNIT) PO TABS
2000.0000 [IU] | ORAL_TABLET | Freq: Every day | ORAL | Status: DC
  Administered 2019-05-27 – 2019-05-29 (×3): 2000 [IU] via ORAL
  Filled 2019-05-26 (×3): qty 2

## 2019-05-26 MED ORDER — INSULIN ASPART 100 UNIT/ML ~~LOC~~ SOLN
0.0000 [IU] | Freq: Three times a day (TID) | SUBCUTANEOUS | Status: DC
  Administered 2019-05-27: 2 [IU] via SUBCUTANEOUS
  Administered 2019-05-27: 5 [IU] via SUBCUTANEOUS
  Administered 2019-05-28 – 2019-05-29 (×4): 3 [IU] via SUBCUTANEOUS
  Filled 2019-05-26 (×6): qty 1

## 2019-05-26 MED ORDER — OXYCODONE HCL 5 MG PO TABS
10.0000 mg | ORAL_TABLET | Freq: Four times a day (QID) | ORAL | Status: DC | PRN
  Administered 2019-05-27: 10 mg via ORAL
  Filled 2019-05-26: qty 2

## 2019-05-26 MED ORDER — ACETAMINOPHEN 325 MG PO TABS
650.0000 mg | ORAL_TABLET | Freq: Four times a day (QID) | ORAL | Status: DC | PRN
  Administered 2019-05-28: 650 mg via ORAL
  Filled 2019-05-26: qty 2

## 2019-05-26 MED ORDER — SODIUM CHLORIDE 0.9 % IV SOLN
500.0000 mg | Freq: Once | INTRAVENOUS | Status: AC
  Administered 2019-05-26: 500 mg via INTRAVENOUS
  Filled 2019-05-26: qty 500

## 2019-05-26 MED ORDER — MELATONIN 5 MG PO TABS
5.0000 mg | ORAL_TABLET | Freq: Every day | ORAL | Status: DC
  Administered 2019-05-27 – 2019-05-28 (×3): 5 mg via ORAL
  Filled 2019-05-26 (×4): qty 1

## 2019-05-26 MED ORDER — THIAMINE HCL 100 MG PO TABS
100.0000 mg | ORAL_TABLET | Freq: Every day | ORAL | Status: DC
  Administered 2019-05-27 – 2019-05-29 (×3): 100 mg via ORAL
  Filled 2019-05-26 (×3): qty 1

## 2019-05-26 MED ORDER — ENOXAPARIN SODIUM 40 MG/0.4ML ~~LOC~~ SOLN: 40.0000 mg | SUBCUTANEOUS | Status: DC

## 2019-05-26 MED ORDER — VANCOMYCIN HCL 1750 MG/350ML IV SOLN
1750.0000 mg | Freq: Once | INTRAVENOUS | Status: AC
  Administered 2019-05-27: 1750 mg via INTRAVENOUS
  Filled 2019-05-26: qty 350

## 2019-05-26 NOTE — ED Notes (Signed)
IVF not infusing effectively by gravity, pump applied

## 2019-05-26 NOTE — ED Notes (Signed)
Pt c/o SOB and swelling in feet, states she took diuretics with some improvement. Pt reports she uses home O2 PRN.  Pt aox4, nad noted. Pt dyspneic with exertion. 1+ pitting edema noted in lower extremities bilaterally.

## 2019-05-26 NOTE — ED Notes (Signed)
Pt continuously bend arm despite education about IV maintenance/medications. Stayed in room to complete rocephin (see emar).

## 2019-05-26 NOTE — ED Provider Notes (Signed)
[facility] Emergency Department Provider Note   ____________________________________________    I have reviewed the triage vital signs and the nursing notes.   HISTORY  Chief Complaint Shortness of Breath and Leg Swelling     HPI Margaret Herrera is a 59 y.o. female with a history of diabetes, hypertension, metastatic breast cancer, being managed at [facility] who presents with complaints of shortness of breath.  She notes that her ankles were little swollen this morning so she took some extra fluid pills with little improvement.  She reports marked short of breath with ambulation especially.  No fevers reported.  No loss of taste or smell.  No sick contacts.  Does not smoke.  No history of COPD.  Last chemotherapy 3 weeks ago.  Past Medical History:  Diagnosis Date  . Diabetes mellitus without complication ([place])   . Hypertension     Patient Active Problem List   Diagnosis Date Noted  . Chest pain 03/20/2017    Past Surgical History:  Procedure Laterality Date  . ABDOMINAL HYSTERECTOMY     partial    Prior to Admission medications   Medication Sig Start Date End Date Taking? Authorizing Provider  cephALEXin (KEFLEX) 500 MG capsule Take 1 capsule (500 mg total) by mouth 3 (three) times daily. 01/28/19   Johnn Hai, PA-C  furosemide (LASIX) 40 MG tablet Take 40 mg by mouth daily.    [provider]  hydrALAZINE (APRESOLINE) 100 MG tablet Take 100 mg by mouth 3 (three) times daily. 09/17/15   [provider]  hydrochlorothiazide (HYDRODIURIL) 25 MG tablet Take 25 mg by mouth daily.    [provider]  insulin detemir (LEVEMIR) 100 UNIT/ML injection Inject 0.35 mLs (35 Units total) into the skin at bedtime. 03/21/17   Dustin Flock, MD  isosorbide mononitrate (IMDUR) 60 MG 24 hr tablet Take 60 mg by mouth daily.    [provider]  JANUVIA 100 MG tablet Take 100 mg by mouth daily. 09/11/15   [provider]  lisinopril (PRINIVIL,ZESTRIL) 20 MG tablet Take 20 mg by mouth daily.     [provider]  metoprolol (LOPRESSOR) 100 MG tablet Take 100 mg by mouth 2 (two) times daily.    [provider]     Allergies Patient has no known allergies.  Family History  Problem Relation Age of Onset  . Diabetes Mother     Social History Social History   Tobacco Use  . Smoking status: Never Smoker  . Smokeless tobacco: Never Used  Substance Use Topics  . Alcohol use: No  . Drug use: No    Review of Systems  Constitutional: No fever/chills Eyes: No visual changes.  ENT: No sore throat. Cardiovascular: Denies chest pain. Respiratory: As above Gastrointestinal: No abdominal pain.  Genitourinary: Negative for dysuria. Musculoskeletal: Negative for back pain. Skin: Negative for rash. Neurological: Negative for headaches    ____________________________________________   PHYSICAL EXAM:  VITAL SIGNS: ED Triage Vitals  Enc Vitals Group     BP 05/26/19 1434 136/65     Pulse Rate 05/26/19 1434 83     Resp 05/26/19 1434 18     Temp 05/26/19 1434 98.9 F (37.2 C)     Temp Source 05/26/19 1434 Oral     SpO2 05/26/19 1434 95 %     Weight 05/26/19 1444 89.4 kg (197 lb)     Height 05/26/19 1444 1.575 m (5\' 2" )     Head Circumference --  Peak Flow --      Pain Score 05/26/19 1444 0     Pain Loc --      Pain Edu? --      Excl. in [place]? --     Constitutional: Alert and oriented.  Nose: No congestion/rhinnorhea. Mouth/Throat: Mucous membranes are moist.    Cardiovascular: Normal rate, regular rhythm. Grossly normal heart sounds.  Good peripheral circulation. Respiratory: Normal respiratory effort.  No retractions. Lungs CTAB.  No wheezing Gastrointestinal: Soft and nontender. No distention.  Genitourinary: deferred Musculoskeletal: Mild edema to the ankles warm and well perfused Neurologic:  Normal speech and language. No gross focal neurologic deficits are  appreciated.  Skin:  Skin is warm, dry and intact. No rash noted. Psychiatric: Mood and affect are normal. Speech and behavior are normal.  ____________________________________________   LABS (all labs ordered are listed, but only abnormal results are displayed)  Labs Reviewed  BASIC METABOLIC PANEL - Abnormal; Notable for the following components:      Result Value   CO2 21 (*)    Glucose, Bld 136 (*)    BUN 41 (*)    Creatinine, Ser 2.32 (*)    Calcium 8.0 (*)    GFR calc non Af Amer 22 (*)    GFR calc Af Amer 26 (*)    All other components within normal limits  CBC - Abnormal; Notable for the following components:   RBC 2.90 (*)    Hemoglobin 7.9 (*)    HCT 25.9 (*)    RDW 17.8 (*)    Platelets 466 (*)    All other components within normal limits  SARS CORONAVIRUS 2 (TAT 6-24 HRS)  CULTURE, BLOOD (ROUTINE X 2)  CULTURE, BLOOD (ROUTINE X 2)  PROCALCITONIN  LACTIC ACID, PLASMA  LACTIC ACID, PLASMA  POC SARS CORONAVIRUS 2 AG -  ED  POC SARS CORONAVIRUS 2 AG   ____________________________________________  EKG  ED ECG REPORT I, Lavonia Drafts, the attending physician, personally viewed and interpreted this ECG.  Date: 05/26/2019  Rhythm: normal sinus rhythm QRS Axis: normal Intervals: normal ST/T Wave abnormalities: normal Narrative Interpretation: no evidence of acute ischemia  ____________________________________________  RADIOLOGY  Chest x-ray demonstrates possible multifocal pneumonia, small left-sided effusion, viewed by me ____________________________________________   PROCEDURES  Procedure(s) performed: No  Procedures   Critical Care performed: No ____________________________________________   INITIAL IMPRESSION / ASSESSMENT AND PLAN / ED COURSE  Pertinent labs & imaging results that were available during my care of the patient were reviewed by me and considered in my medical decision making (see chart for details).  Patient presents with  shortness of breath as described above.  Chest x-ray is consistent with possible multifocal infiltrate, suspicious for Covid however patient has no other Covid-like symptoms.  Possible edema versus possible CAP.  Lab work is overall reassuring, will add blood cultures, procalcitonin, treat with IV Rocephin and IV azithromycin and admit to the hospitalist given her acute on chronic kidney injury as well.    ____________________________________________   FINAL CLINICAL IMPRESSION(S) / ED DIAGNOSES  Final diagnoses:  Community acquired pneumonia, unspecified laterality  Acute renal failure superimposed on chronic kidney disease, unspecified CKD stage, unspecified acute renal failure type ([place])        Note:  This document was prepared using Dragon voice recognition software and may include unintentional dictation errors.   Lavonia Drafts, MD 05/26/19 Darlin Drop

## 2019-05-26 NOTE — ED Triage Notes (Signed)
PT to ER states she noted bilateral leg swelling and increased SHOB last night.  Pt states she doubled up on her fluid pills this AM and is feeling some better.

## 2019-05-26 NOTE — H&P (Signed)
History and Physical    Margaret Herrera [id number] DOB: December 28, 1960 DOA: 05/26/2019  PCP: [facility] [employer] Patient coming from: home  I have personally briefly reviewed patient's old medical records in [place]  Chief Complaint: Shortness of breath and lower extremity edema  HPI: Margaret Herrera is a 59 y.o. 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 Her worsening bilateral lower extremity edema started last night and she took an extra dose of her diuretic with good urine output.  Then this afternoon she noted acute shortness of breath worse with exertion and improved at rest.  She overall feels fatigue.  Denies any coughing or runny nose.  No fever.  Denies abdominal pain. Denies any chest pain.  Denies any nausea, vomiting or abdominal pain.  Of note, she has already had several hospitalizations this year.  She was hospitalized from 04/02/2019-04/04/2019 and found to have large left pleural effusion suspected to be malignant etiology required thoracentesis with 2 L drained.  She was then admitted on 04/08/2019-04/11/2019 for possible vasovagal reaction versus hypersensitive reaction to her chemotherapy infusion.  Oncology continues to have ongoing conversations with her regarding her goals of care.  ED Course:  She was afebrile and normotensive here. No leukocytosis, hemoglobin of 7.9 from a prior of 8.4 several weeks ago.  Platelet of 466.  Glucose of 136, creatinine of 2.32 from a prior of 1.81 on 05/12/2018 Procalcitonin of 0.10  Chest x-ray shows hazy bilateral airspace opacities greater on the left and may represent atypical infection process.  There is a one-point centimeter pulmonary nodule in the right lower  lung zone concerning for metastatic disease.  Possible sclerotic lesion involving the rib.  Review of Systems:   Constitutional: No Weight Change, No Fever ENT/Mouth: No sore throat, No Rhinorrhea Eyes: No Eye Pain, No Vision Changes Cardiovascular: No Chest Pain, + SOB, + Dyspnea on Exertion, No Orthopnea +Edema, No Palpitations Respiratory: No Cough, No Sputum, No Wheezing, no Dyspnea  Gastrointestinal: No Nausea, No Vomiting, No Diarrhea, No Constipation, No Pain Genitourinary: no Urinary Incontinence, No Urgency, No Flank Pain Musculoskeletal: No Arthralgias, No Myalgias Skin: No Skin Lesions, No Pruritus, Neuro: no Weakness, No Numbness,  No Loss of Consciousness, No Syncope Psych: No Anxiety/Panic, No Depression, no decrease appetite Heme/Lymph: No Bruising, No Bleeding Past Medical History:  Diagnosis Date  . Diabetes mellitus without complication ([place])   . Hypertension     Past Surgical History:  Procedure Laterality Date  . ABDOMINAL HYSTERECTOMY     partial     reports that she has never smoked. She has never used smokeless tobacco. She reports that she does not drink alcohol or use drugs.  No Known Allergies  Family History  Problem Relation Age of Onset  . Diabetes Mother      Prior to Admission medications   Medication Sig Start Date End Date Taking? Authorizing Provider  amLODipine (NORVASC) 10 MG tablet Take 10 mg by mouth daily at 6 (six) AM. 04/04/19  Yes [provider]  atorvastatin (LIPITOR) 40 MG tablet Take 40 mg by mouth at bedtime.   Yes [provider]  carvedilol (COREG) 25 MG tablet Take 25 mg by mouth 2 (two) times daily. 10/19/18  Yes [provider]  Cholecalciferol 25 MCG (1000 UT) tablet Take 2,000  Units by mouth daily at 6 (six) AM. 01/05/19 01/05/20 Yes [provider]  cloNIDine (CATAPRES) 0.3 MG tablet Take 0.3 mg by mouth 2 (two) times daily.   Yes [provider]  furosemide (LASIX) 40 MG  tablet Take 40 mg by mouth daily.   Yes [provider]  insulin NPH-regular Human (70-30) 100 UNIT/ML injection Inject 20 Units into the skin 2 (two) times daily. 04/11/19  Yes [provider]  isosorbide mononitrate (IMDUR) 60 MG 24 hr tablet Take 60 mg by mouth daily.   Yes [provider]  Melatonin 3 MG TABS Take 6 mg by mouth at bedtime. 04/04/19  Yes [provider]  mirtazapine (REMERON) 30 MG tablet Take 30 mg by mouth at bedtime. 04/11/19  Yes [provider]  Oxycodone HCl 10 MG TABS Take 10 mg by mouth every 6 (six) hours as needed. 05/13/19  Yes [provider]  thiamine 100 MG tablet Take 100 mg by mouth daily at 6 (six) AM. 04/04/19 07/13/19 Yes [provider]  acetaminophen (TYLENOL) 325 MG tablet Take 650 mg by mouth every 6 (six) hours as needed for mild pain.    [provider]  ondansetron (ZOFRAN-ODT) 4 MG disintegrating tablet Take 4 mg by mouth every 8 (eight) hours as needed for nausea.    [provider]  prochlorperazine (COMPAZINE) 10 MG tablet Take 10 mg by mouth every 6 (six) hours as needed. 04/22/19   [provider]    Physical Exam: Vitals:   05/26/19 1915 05/26/19 1930 05/26/19 1945 05/26/19 2000  BP:  129/61  124/68  Pulse: 74 72 69 68  Resp: 19 15 15 14   Temp:      TempSrc:      SpO2: 97% 96% 97% 98%  Weight:      Height:        Constitutional: NAD, calm, comfortable, chronically ill-appearing lethargic female laying flat in bed Vitals:   05/26/19 1915 05/26/19 1930 05/26/19 1945 05/26/19 2000  BP:  129/61  124/68  Pulse: 74 72 69 68  Resp: 19 15 15 14   Temp:      TempSrc:      SpO2: 97% 96% 97% 98%  Weight:      Height:       Eyes: PERRL, lids and conjunctivae normal ENMT: Mucous membranes are moist. Neck: normal, supple Respiratory: clear to auscultation bilaterally, no wheezing, no crackles. Normal respiratory effort on room air. No accessory muscle use.    Cardiovascular: Regular rate and rhythm, no murmurs / rubs / gallops.  +2 nonpitting edema around bilateral ankles.  2+ pedal pulses. Abdomen: no tenderness, no masses palpated. Bowel sounds positive.  Musculoskeletal: no clubbing / cyanosis. No joint deformity upper and lower extremities. Good ROM, no contractures. Normal muscle tone.  Skin: no rashes, lesions, ulcers. No induration Neurologic: CN 2-12 grossly intact. Sensation intact. Strength 5/5 in all 4.  Psychiatric: Normal judgment and insight. Alert and oriented x 3. Normal mood.     Labs on Admission: I have personally reviewed following labs and imaging studies  CBC: Recent Labs  Lab 05/26/19 1450  WBC 5.1  HGB 7.9*  HCT 25.9*  MCV 89.3  PLT 123XX123*   Basic Metabolic Panel: Recent Labs  Lab 05/26/19 1450  NA 137  K 4.2  CL 105  CO2 21*  GLUCOSE 136*  BUN 41*  CREATININE 2.32*  CALCIUM 8.0*   GFR: Estimated Creatinine Clearance: 26.8 mL/min (A) (by C-G  formula based on SCr of 2.32 mg/dL (H)). Liver Function Tests: No results for input(s): AST, ALT, ALKPHOS, BILITOT, PROT, ALBUMIN in the last 168 hours. No results for input(s): LIPASE, AMYLASE in the last 168 hours. No results for input(s): AMMONIA in the last 168 hours. Coagulation Profile: No results for input(s): INR, PROTIME in the last 168 hours. Cardiac Enzymes: No results for input(s): CKTOTAL, CKMB, CKMBINDEX, TROPONINI in the last 168 hours. BNP (last 3 results) No results for input(s): PROBNP in the last 8760 hours. HbA1C: No results for input(s): HGBA1C in the last 72 hours. CBG: No results for input(s): GLUCAP in the last 168 hours. Lipid Profile: No results for input(s): CHOL, HDL, LDLCALC, TRIG, CHOLHDL, LDLDIRECT in the last 72 hours. Thyroid Function Tests: No results for input(s): TSH, T4TOTAL, FREET4, T3FREE, THYROIDAB in the last 72 hours. Anemia Panel: No results for input(s): VITAMINB12, FOLATE, FERRITIN, TIBC, IRON, RETICCTPCT in the  last 72 hours. Urine analysis:    Component Value Date/Time   COLORURINE STRAW (A) 03/20/2017 1216   APPEARANCEUR CLEAR (A) 03/20/2017 1216   LABSPEC 1.008 03/20/2017 1216   PHURINE 6.0 03/20/2017 1216   GLUCOSEU 150 (A) 03/20/2017 1216   HGBUR NEGATIVE 03/20/2017 1216   BILIRUBINUR NEGATIVE 03/20/2017 1216   KETONESUR NEGATIVE 03/20/2017 1216   PROTEINUR NEGATIVE 03/20/2017 1216   NITRITE NEGATIVE 03/20/2017 1216   LEUKOCYTESUR NEGATIVE 03/20/2017 1216    Radiological Exams on Admission: DG Chest 2 View  Result Date: 05/26/2019 CLINICAL DATA:  Shortness of breath EXAM: CHEST - 2 VIEW COMPARISON:  March 20, 2017 FINDINGS: There are hazy bilateral airspace opacities, greatest in the left mid and left lower lung zones. There appears to be a new left-sided pleural effusion. Coarsened lung markings are noted bilaterally. There are advanced degenerative changes of the glenohumeral joints, left worse than right. There is suggestion of sclerotic lesions involving the bilateral ribs. There is a nodular density projecting over the anterior fifth rib on the right measuring approximately 1.2 cm. IMPRESSION: 1. Hazy bilateral airspace opacities, greatest on the left, may represent an atypical infectious process. 2. Apparent 1.2 cm pulmonary nodule in the right lower lung zone. This is concerning for metastatic disease in the setting of known breast cancer. 3. Small left-sided pleural effusion. 4. Possible sclerotic lesions involving the ribs and left glenoid, concerning for osseous metastatic disease. Electronically Signed   By: Constance Holster M.D.   On: 05/26/2019 15:33    EKG: Independently reviewed.  Assessment/Plan Questionable community-acquired pneumonia in the setting of dyspnea and worsening lower extremity edema Chest x-ray showed hazy bilateral airspace opacity greater than the left that may represent atypical infection.  However patient does not have leukocytosis and had low  procalcitonin although she is immunosuppressed on chemotherapy. Will switch to Vancomycin and cefepime.  Will also obtain RVP to evaluate for viral cause. Patient is high risk for pulmonary embolism due to her malignancy but with current AKI she would not be able to get a contrast study.  D-dimer would also be falsely elevated. So far she is not tachycardic or hypoxic so will continue to monitor and once her creatinine could consider CTA chest if she shows no clinical improvement. If she worsens then could get VQ scan.  AKI Creatinine elevated to 2.32 from prior 1.81 Avoid nephrotoxic agent She has received 500 cc bolus in the ED  Acute on chronic anemia Patient With hemoglobin of 7.9 from a prior of 8.4. She was noted to have a colonoscopy  several weeks ago but unfortunately cannot see a documented result.  She did noted that she has seen black stools since not on any iron supplement. will obtain FOBT  HFpEF Does not appear fluid overloaded.  Hold Lasix in the setting of AKI. Continue Coreg,Imdur  Metastatic left breast cancer to bone and lung On chemotherapy following oncology outpatient  Insulin-dependent type 2 diabetes Takes 20 twice daily 70/30 NPH BG of 136 on admit moderate SSI for now  Hypertension Continue amlodipine, clonidine  DVT prophylaxis:SCDs Code Status: Full Family Communication: Plan discussed with patient at bedside  disposition Plan: Home with at least 2 midnight stays  Consults called:  Admission status: inpatient    Jelesa Mangini T Sebron Mcmahill DO [facility]   If 7PM-7AM, please contact night-coverage [email or web address]   05/26/2019, 9:13 PM

## 2019-05-26 NOTE — ED Notes (Signed)
covid test pending

## 2019-05-26 NOTE — ED Notes (Signed)
No transport team is available per charge RN, RN transporting pt to room at this time.

## 2019-05-26 NOTE — ED Notes (Signed)
Report given to inpatient RN. Transportation requested.  

## 2019-05-26 NOTE — ED Notes (Signed)
Pt changed into gown and repositioned in bed.

## 2019-05-27 ENCOUNTER — Inpatient Hospital Stay: Payer: Medicaid Other

## 2019-05-27 DIAGNOSIS — J90 Pleural effusion, not elsewhere classified: Secondary | ICD-10-CM

## 2019-05-27 LAB — GLUCOSE, CAPILLARY
Glucose-Capillary: 120 mg/dL — ABNORMAL HIGH (ref 70–99)
Glucose-Capillary: 209 mg/dL — ABNORMAL HIGH (ref 70–99)
Glucose-Capillary: 130 mg/dL — ABNORMAL HIGH (ref 70–99)
Glucose-Capillary: 158 mg/dL — ABNORMAL HIGH (ref 70–99)

## 2019-05-27 LAB — BASIC METABOLIC PANEL
Anion gap: 11 (ref 5–15)
BUN: 38 mg/dL — ABNORMAL HIGH (ref 6–20)
CO2: 18 mmol/L — ABNORMAL LOW (ref 22–32)
Calcium: 8.1 mg/dL — ABNORMAL LOW (ref 8.9–10.3)
Chloride: 108 mmol/L (ref 98–111)
Creatinine, Ser: 1.93 mg/dL — ABNORMAL HIGH (ref 0.44–1.00)
GFR calc Af Amer: 32 mL/min — ABNORMAL LOW (ref >60)
GFR calc non Af Amer: 28 mL/min — ABNORMAL LOW (ref >60)
Glucose, Bld: 106 mg/dL — ABNORMAL HIGH (ref 70–99)
Potassium: 4 mmol/L (ref 3.5–5.1)
Sodium: 137 mmol/L (ref 135–145)

## 2019-05-27 LAB — MRSA PCR SCREENING: MRSA by PCR: NEGATIVE

## 2019-05-27 LAB — CBC
HCT: 23.6 % — ABNORMAL LOW (ref 36.0–46.0)
Hemoglobin: 7.2 g/dL — ABNORMAL LOW (ref 12.0–15.0)
MCH: 27.2 pg (ref 26.0–34.0)
MCHC: 30.5 g/dL (ref 30.0–36.0)
MCV: 89.1 fL (ref 80.0–100.0)
Platelets: 390 10*3/uL (ref 150–400)
RBC: 2.65 MIL/uL — ABNORMAL LOW (ref 3.87–5.11)
RDW: 17.9 % — ABNORMAL HIGH (ref 11.5–15.5)
WBC: 4.7 10*3/uL (ref 4.0–10.5)
nRBC: 0 % (ref 0.0–0.2)

## 2019-05-27 LAB — HIV ANTIBODY (ROUTINE TESTING W REFLEX): HIV Screen 4th Generation wRfx: NONREACTIVE

## 2019-05-27 LAB — HEMOGLOBIN A1C
Hgb A1c MFr Bld: 6.7 % — ABNORMAL HIGH (ref 4.8–5.6)
Mean Plasma Glucose: 145.59 mg/dL

## 2019-05-27 LAB — SARS CORONAVIRUS 2 (TAT 6-24 HRS): SARS Coronavirus 2: NEGATIVE

## 2019-05-27 LAB — VANCOMYCIN, RANDOM: Vancomycin Rm: 18

## 2019-05-27 MED ORDER — TECHNETIUM TC 99M DIETHYLENETRIAME-PENTAACETIC ACID
33.3400 | Freq: Once | INTRAVENOUS | Status: AC | PRN
  Administered 2019-05-27: 33.34 via RESPIRATORY_TRACT

## 2019-05-27 MED ORDER — SODIUM CHLORIDE 0.9 % IV SOLN
2.0000 g | Freq: Two times a day (BID) | INTRAVENOUS | Status: DC
  Filled 2019-05-27: qty 2

## 2019-05-27 MED ORDER — VANCOMYCIN VARIABLE DOSE PER UNSTABLE RENAL FUNCTION (PHARMACIST DOSING): Status: DC

## 2019-05-27 MED ORDER — OXYCODONE HCL 5 MG PO TABS
10.0000 mg | ORAL_TABLET | Freq: Four times a day (QID) | ORAL | Status: DC | PRN
  Administered 2019-05-27 – 2019-05-29 (×7): 10 mg via ORAL
  Filled 2019-05-27 (×8): qty 2

## 2019-05-27 MED ORDER — SODIUM CHLORIDE 0.9 % IV SOLN
2.0000 g | INTRAVENOUS | Status: DC
  Administered 2019-05-27: 2 g via INTRAVENOUS
  Filled 2019-05-27: qty 2

## 2019-05-27 MED ORDER — SODIUM CHLORIDE 0.9 % IV SOLN
INTRAVENOUS | Status: DC | PRN
  Administered 2019-05-27: 250 mL via INTRAVENOUS

## 2019-05-27 MED ORDER — SODIUM CHLORIDE 0.9% FLUSH
3.0000 mL | Freq: Two times a day (BID) | INTRAVENOUS | Status: DC
  Administered 2019-05-27 – 2019-05-28 (×5): 3 mL via INTRAVENOUS

## 2019-05-27 MED ORDER — TECHNETIUM TO 99M ALBUMIN AGGREGATED
4.4800 | Freq: Once | INTRAVENOUS | Status: AC | PRN
  Administered 2019-05-27: 4.48 via INTRAVENOUS

## 2019-05-27 NOTE — Progress Notes (Signed)
Patient presents for  therapeutic left sided thoracentesis. US limited chest shows trace amount of pleural fluid noted  Insufficient to perform a safe thoracentesis. Procedure not performed.  

## 2019-05-27 NOTE — Plan of Care (Signed)
  Problem: Activity: Goal: Ability to tolerate increased activity will improve Outcome: Progressing   Problem: Respiratory: Goal: Ability to maintain adequate ventilation will improve Outcome: Progressing   

## 2019-05-27 NOTE — Progress Notes (Signed)
Pharmacy Antibiotic Note  Margaret Herrera is a 59 y.o. female admitted on 05/26/2019 with SOB LE edema w/ h/o HTN, DM, CKD, mets breast CA s/p chemo, normocytic anemia.  Pharmacy has been consulted for vanc/cefepime dosing. Patient received initially in the ED azithromycin/ceftriaxon then vanc 1.75g IV load.   Plan: Patient is currently in AKI on CKD w/ a current SCr of 2.32 (baseline Scr of 1.65 - 1.8).  Will check a 12 hour vanc random level in addition w/ patient's Scr w/ am labs and will reassess the dosing regimen. If renal function continues to decline will dose per random levels w/ a goal random < 20 mcg/mL for re-dosing. If renal function becomes stable and/or achieves baseline will considering a scheduled regimen.  Will continue cefepime 2g IV q24h per CrCl 11 - 29 ml/min and will continue to monitor.  Height: 5\' 2"  (157.5 cm) Weight: 198 lb 12.8 oz (90.2 kg) IBW/kg (Calculated) : 50.1  Temp (24hrs), Avg:98.5 F (36.9 C), Min:97.8 F (36.6 C), Max:98.9 F (37.2 C)  Recent Labs  Lab 05/26/19 1450 05/26/19 1850  WBC 5.1  --   CREATININE 2.32*  --   LATICACIDVEN  --  0.8    Estimated Creatinine Clearance: 26.9 mL/min (A) (by C-G formula based on SCr of 2.32 mg/dL (H)).    No Known Allergies   Thank you for allowing pharmacy to be a part of this patient's care.  Tobie Lords, PharmD, BCPS Clinical Pharmacist 05/27/2019 2:40 AM

## 2019-05-27 NOTE — Progress Notes (Signed)
PT Cancellation Note  Patient Details Name: Margaret Herrera MRN: [id number] DOB: 1960/11/16   Cancelled Treatment:    Reason Eval/Treat Not Completed: Other (comment). Consult received and chart reviewed. Pt pending imaging for possible PE. Will hold and re-attempt after results acknowledged.   Adriell Polansky 05/27/2019, 3:35 PM  Greggory Stallion, PT, DPT [phone]

## 2019-05-27 NOTE — Plan of Care (Signed)
Respers even, unlabored.  Saturations WNL.

## 2019-05-27 NOTE — Progress Notes (Signed)
[name] [facility] [place] NAME: Margaret Herrera    MR#:  [id number]  DATE OF BIRTH:  07-06-1960  SUBJECTIVE:    REVIEW OF SYSTEMS:   ROS Tolerating Diet: Tolerating PT:   DRUG ALLERGIES:  No Known Allergies  VITALS:  Blood pressure (!) 143/73, pulse 67, temperature 98.8 F (37.1 C), temperature source Oral, resp. rate 17, height 5\' 2"  (1.575 m), weight 90.2 kg, SpO2 100 %.  PHYSICAL EXAMINATION:   Physical Exam  GENERAL:  59 y.o.-year-old patient lying in the bed with no acute distress.  EYES: Pupils equal, round, reactive to light and accommodation. No scleral icterus.   HEENT: Head atraumatic, normocephalic. Oropharynx and nasopharynx clear.  NECK:  Supple, no jugular venous distention. No thyroid enlargement, no tenderness.  LUNGS: Normal breath sounds bilaterally, no wheezing, rales, rhonchi. No use of accessory muscles of respiration.  CARDIOVASCULAR: S1, S2 normal. No murmurs, rubs, or gallops.  ABDOMEN: Soft, nontender, nondistended. Bowel sounds present. No organomegaly or mass.  EXTREMITIES: No cyanosis, clubbing or edema b/l.    NEUROLOGIC: Cranial nerves II through XII are intact. No focal Motor or sensory deficits b/l.   PSYCHIATRIC:  patient is alert and oriented x 3.  SKIN: No obvious rash, lesion, or ulcer.   LABORATORY PANEL:  CBC Recent Labs  Lab 05/27/19 0600  WBC 4.7  HGB 7.2*  HCT 23.6*  PLT 390    Chemistries  Recent Labs  Lab 05/27/19 0600  NA 137  K 4.0  CL 108  CO2 18*  GLUCOSE 106*  BUN 38*  CREATININE 1.93*  CALCIUM 8.1*   Cardiac Enzymes No results for input(s): TROPONINI in the last 168 hours. RADIOLOGY:  DG Chest 2 View  Result Date: 05/26/2019 CLINICAL DATA:  Shortness of breath EXAM: CHEST - 2 VIEW COMPARISON:  March 20, 2017 FINDINGS: There are hazy bilateral airspace opacities, greatest in the left mid and left lower lung zones. There appears to be a new left-sided pleural  effusion. Coarsened lung markings are noted bilaterally. There are advanced degenerative changes of the glenohumeral joints, left worse than right. There is suggestion of sclerotic lesions involving the bilateral ribs. There is a nodular density projecting over the anterior fifth rib on the right measuring approximately 1.2 cm. IMPRESSION: 1. Hazy bilateral airspace opacities, greatest on the left, may represent an atypical infectious process. 2. Apparent 1.2 cm pulmonary nodule in the right lower lung zone. This is concerning for metastatic disease in the setting of known breast cancer. 3. Small left-sided pleural effusion. 4. Possible sclerotic lesions involving the ribs and left glenoid, concerning for osseous metastatic disease. Electronically Signed   By: Constance Holster M.D.   On: 05/26/2019 15:33   ASSESSMENT AND PLAN:  Margaret Herrera is a 59 y.o. 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 She was hospitalized from 04/02/2019-04/04/2019 and found to have large left pleural effusion suspected to be malignant etiology required thoracentesis with 2 L drained.  Shortness of breath suspected due to Recurrent Left Pleural effusion -Chest x-ray showed mild  hazy bilateral airspace opacity with left sided pleural effusion -pro-calcitonin negative, pt afebrile, wbc 4.7 -s/p chemo >3 weeks ago -d/c IV abxs -Patient is high risk for pulmonary embolism due to her malignancy but with current  AKI she would not be able to get a contrast study -D-dimer would also be falsely elevated.  -So far she is not tachycardic or hypoxic -IR to attempt US thoracentesis today D/w Dr Vernard Gambles -will get V/Q scan to r/o PE -pt has had large volume thoracentesis at [facility]  on jan 5th--suspected due to her metastatic breast cancer  AKI suspected from overdiuresing with po lasix at home Creatinine elevated to 2.32 from prior 1.81 Avoid nephrotoxic agent She has received 500 cc bolus in the ED Creat improved to 1.9  Acute on chronic anemia Patient With hemoglobin of 7.9 from a prior of 8.4. She was noted to have a colonoscopy several weeks ago but unfortunately cannot see a documented result.  She did noted that she has seen black stools since not on any iron supplement. Cont to monitor  HFpEF Does not appear fluid overloaded.  Hold Lasix in the setting of AKI. Continue Coreg,Imdur -no leg sweling  Metastatic left breast cancer to bone and lung On chemotherapy following oncology outpatient--at [facility] Dr Marolyn Hammock  Insulin-dependent type 2 diabetes Takes 20 twice daily 70/30 NPH BG of 136 on admit moderate SSI for now  Hypertension Continue amlodipine, clonidine  Pt has HHPT set up thru [facility]  DVT prophylaxis:SCDs Code Status: Full Family Communication: Plan discussed with patient at bedside  disposition Plan: Home likely tomorrow. Getting US thoracentesis and V/Q scan Admission status: inpatient  TOTAL TIME TAKING CARE OF THIS PATIENT: *30* minutes.  >50% time spent on counselling and coordination of care  Note: This dictation was prepared with Dragon dictation along with smaller phrase technology. Any transcriptional errors that result from this process are unintentional.  Fritzi Mandes M.D    [facility]   CC: Primary care physician; Inc, [place] ServicesPatient ID: Margaret Herrera, female   DOB: July 03, 1960, 59 y.o.   MRN: [id number]

## 2019-05-27 NOTE — Consult Note (Signed)
PHARMACY NOTE:  ANTIMICROBIAL RENAL DOSAGE ADJUSTMENT  Current antimicrobial regimen includes a mismatch between antimicrobial dosage and estimated renal function.  As per policy approved by the Pharmacy & Therapeutics and Medical Executive Committees, the antimicrobial dosage will be adjusted accordingly.  Current antimicrobial dosage:  Cefepime 2g q24H   Indication: CAP, escalated abx due to chemotherapy  Renal Function:  Estimated Creatinine Clearance: 32.3 mL/min (A) (by C-G formula based on SCr of 1.93 mg/dL (H)).    Antimicrobial dosage has been changed to:  Cefepime 2g q12H  Additional comments:   Thank you for allowing pharmacy to be a part of this patient's care.  Oswald Hillock, [facility] 05/27/2019 9:33 AM

## 2019-05-28 LAB — IRON AND TIBC
Iron: 47 ug/dL (ref 28–170)
Saturation Ratios: 23 % (ref 10.4–31.8)
TIBC: 204 ug/dL — ABNORMAL LOW (ref 250–450)
UIBC: 157 ug/dL

## 2019-05-28 LAB — ABO/RH: ABO/RH(D): AB NEG

## 2019-05-28 LAB — RESPIRATORY PANEL BY PCR

## 2019-05-28 LAB — GLUCOSE, CAPILLARY
Glucose-Capillary: 186 mg/dL — ABNORMAL HIGH (ref 70–99)
Glucose-Capillary: 174 mg/dL — ABNORMAL HIGH (ref 70–99)
Glucose-Capillary: 158 mg/dL — ABNORMAL HIGH (ref 70–99)
Glucose-Capillary: 107 mg/dL — ABNORMAL HIGH (ref 70–99)

## 2019-05-28 LAB — VITAMIN B12: Vitamin B-12: 309 pg/mL (ref 180–914)

## 2019-05-28 LAB — HEMOGLOBIN AND HEMATOCRIT, BLOOD
HCT: 27.6 % — ABNORMAL LOW (ref 36.0–46.0)
Hemoglobin: 8.6 g/dL — ABNORMAL LOW (ref 12.0–15.0)

## 2019-05-28 LAB — HEMOGLOBIN: Hemoglobin: 7.3 g/dL — ABNORMAL LOW (ref 12.0–15.0)

## 2019-05-28 MED ORDER — SODIUM CHLORIDE 0.9% IV SOLUTION: Freq: Once | INTRAVENOUS | Status: AC

## 2019-05-28 NOTE — Progress Notes (Signed)
Blood bank called to notify that patients blood type was incorrect in system it stated O+ when pt is AB-

## 2019-05-28 NOTE — Evaluation (Signed)
Physical Therapy Evaluation Patient Details Name: Margaret Herrera MRN: [id number] DOB: [birth year] Today's Date: 05/28/2019   History of Present Illness  presented to ER secondary to bilat LE edema, SOB; admitted for management of CAP vs recurrent L pleural effusion.  Attempted thoracentesis 3/1, but noted with insufficient fluid levels for removal.  Of note, previous hospitalizations 1/5-7 due to L pleural effusion; 1/11-14 for vasovagal episode vs reaction to chemo.  Last chemo treatment received approx three weeks prior to this admission; scheduled for repeat this upcoming Tuesday.  Clinical Impression  Patient sleeping upon arrival to room, awakens to voice/light touch, but requires consistent stimulation to maintain alertness throughout session.  Appears globally weak and deconditioned, LE > UE; rating pain/soreness in distal R LE (FACES 4/10).  Currently requiring min assist for bed mobility; min/mod assist for sit/stand, basic transfers and gait (5') with RW.  Demonstrates broad BOS, shuffling gait performance; very heavy WBing bilat UEs.  Limited balance in all planes; high risk for LE buckling.  Recommend use of RW and +1 hands-on assist at all times. Unable to tolerate additional distance or stairs at this time; will continue to assess/progress in subsequent sessions as appropriate. Would benefit from skilled PT to address above deficits and promote optimal return to PLOF.; recommend transition to STR upon discharge from acute hospitalization.     Follow Up Recommendations SNF    Equipment Recommendations  Rolling walker with 5" wheels;3in1 (PT)    Recommendations for Other Services       Precautions / Restrictions Precautions Precautions: Fall Restrictions Weight Bearing Restrictions: No      Mobility  Bed Mobility Overal bed mobility: Needs Assistance Bed Mobility: Supine to Sit     Supine to sit: Min guard;Min assist     General bed mobility comments: assist for  LEs  Transfers Overall transfer level: Needs assistance   Transfers: Sit to/from Stand Sit to Stand: Min assist;Mod assist         General transfer comment: heavy use of UEs to assist with lift off and stabilization  Ambulation/Gait Ambulation/Gait assistance: Min assist;Mod assist Gait Distance (Feet): 5 Feet Assistive device: Rolling walker (2 wheeled)       General Gait Details: broad BOS, shuffling gait performance; very heavy WBing bilat UEs.  Limited balance in all planes; high risk for LE buckling.  Recommend use of RW and +1 hands-on assist at all times.  Stairs            Wheelchair Mobility    Modified Rankin (Stroke Patients Only)       Balance Overall balance assessment: Needs assistance Sitting-balance support: No upper extremity supported;Feet supported Sitting balance-Leahy Scale: Good     Standing balance support: Bilateral upper extremity supported Standing balance-Leahy Scale: Poor                               Pertinent Vitals/Pain Pain Assessment: Faces Faces Pain Scale: Hurts little more Pain Location: R distal LE Pain Descriptors / Indicators: Aching;Guarding;Grimacing Pain Intervention(s): Limited activity within patient's tolerance;Monitored during session;Repositioned    Home Living Family/patient expects to be discharged to:: Private residence Living Arrangements: Alone Available Help at Discharge: Family;Available PRN/intermittently Type of Home: House Home Access: Stairs to enter Entrance Stairs-Rails: Right Entrance Stairs-Number of Steps: 3 Home Layout: One level        Prior Function Level of Independence: Independent         Comments: Indep  with ADL, household and community mobilization at baseline; does endorse progressive weakness in bilat LEs after last round of chemo (approx 3 weeks prior to admission)     Hand Dominance        Extremity/Trunk Assessment   Upper Extremity Assessment Upper  Extremity Assessment: Overall WFL for tasks assessed    Lower Extremity Assessment Lower Extremity Assessment: Generalized weakness(L LE grossly 4-/5 throughout, R LE 3 to 3+/5 throughout)       Communication   Communication: No difficulties  Cognition Arousal/Alertness: Lethargic Behavior During Therapy: WFL for tasks assessed/performed Overall Cognitive Status: Within Functional Limits for tasks assessed                                        General Comments      Exercises     Assessment/Plan    PT Assessment Patient needs continued PT services  PT Problem List Decreased strength;Decreased activity tolerance;Decreased balance;Decreased mobility;Decreased safety awareness;Decreased knowledge of precautions;Decreased knowledge of use of DME       PT Treatment Interventions DME instruction;Gait training;Stair training;Functional mobility training;Therapeutic activities;Therapeutic exercise;Balance training;Patient/family education    PT Goals (Current goals can be found in the Care Plan section)  Acute Rehab PT Goals Patient Stated Goal: to go home and have my family help PT Goal Formulation: With patient Time For Goal Achievement: 06/11/19 Potential to Achieve Goals: Fair    Frequency Min 2X/week   Barriers to discharge Decreased caregiver support      Co-evaluation               AM-PAC PT "6 Clicks" Mobility  Outcome Measure Help needed turning from your back to your side while in a flat bed without using bedrails?: None Help needed moving from lying on your back to sitting on the side of a flat bed without using bedrails?: A Little Help needed moving to and from a bed to a chair (including a wheelchair)?: A Little Help needed standing up from a chair using your arms (e.g., wheelchair or bedside chair)?: A Little Help needed to walk in hospital room?: A Lot Help needed climbing 3-5 steps with a railing? : A Lot 6 Click Score: 17    End  of Session Equipment Utilized During Treatment: Gait belt Activity Tolerance: Patient limited by fatigue Patient left: in chair;with call bell/phone within reach;with chair alarm set Nurse Communication: Mobility status PT Visit Diagnosis: Muscle weakness (generalized) (M62.81);Difficulty in walking, not elsewhere classified (R26.2)    Time: 1130-1146 PT Time Calculation (min) (ACUTE ONLY): 16 min   Charges:   PT Evaluation $PT Eval Moderate Complexity: 1 Mod          Seleena Reimers H. Owens Shark, PT, DPT, NCS 05/28/19, 12:16 PM [phone]

## 2019-05-28 NOTE — Progress Notes (Signed)
[place] at [place] NAME: Margaret Herrera    MR#:  [id number]  DATE OF BIRTH:  October 25, 1960  SUBJECTIVE:  patient appears very weak. She is requesting to go home. Tolerating PO diet. Denies chest pain  REVIEW OF SYSTEMS:   Review of Systems  Constitutional: Positive for malaise/fatigue. Negative for chills, fever and weight loss.  HENT: Negative for ear discharge, ear pain and nosebleeds.   Eyes: Negative for blurred vision, pain and discharge.  Respiratory: Positive for shortness of breath. Negative for sputum production, wheezing and stridor.   Cardiovascular: Negative for chest pain, palpitations, orthopnea and PND.  Gastrointestinal: Negative for abdominal pain, diarrhea, nausea and vomiting.  Genitourinary: Negative for frequency and urgency.  Musculoskeletal: Negative for back pain and joint pain.  Neurological: Positive for weakness. Negative for sensory change, speech change and focal weakness.  Psychiatric/Behavioral: Negative for depression and hallucinations. The patient is not nervous/anxious.    Tolerating Diet: yes Tolerating PT: recommends rehab  DRUG ALLERGIES:  No Known Allergies  VITALS:  Blood pressure 137/72, pulse 68, temperature (!) 97.3 F (36.3 C), temperature source Oral, resp. rate 15, height 5\' 2"  (1.575 m), weight 90.2 kg, SpO2 100 %.  PHYSICAL EXAMINATION:   Physical Exam  GENERAL:  59 y.o.-year-old patient lying in the bed with no acute distress.  Weak, deconditioned EYES: Pupils equal, round, reactive to light and accommodation. No scleral icterus.  Pallor+ HEENT: Head atraumatic, normocephalic. Oropharynx and nasopharynx clear. Puffy face+ NECK:  Supple, no jugular venous distention. No thyroid enlargement, no tenderness.  LUNGS decreased breath sounds bilaterally, no wheezing, rales, rhonchi. No use of accessory muscles of respiration.  CARDIOVASCULAR: S1, S2 normal. No murmurs, rubs, or gallops.   ABDOMEN: Soft, nontender, nondistended. Bowel sounds present. No organomegaly or mass.  EXTREMITIES: No cyanosis, clubbing or edema b/l.    NEUROLOGIC: Cranial nerves II through XII are intact. No focal Motor or sensory deficits b/l.   PSYCHIATRIC:  patient is alert and oriented x 3.  SKIN: No obvious rash, lesion, or ulcer.   LABORATORY PANEL:  CBC Recent Labs  Lab 05/27/19 0600 05/27/19 0600 05/28/19 1006  WBC 4.7  --   --   HGB 7.2*   < > 7.3*  HCT 23.6*  --   --   PLT 390  --   --    < > = values in this interval not displayed.    Chemistries  Recent Labs  Lab 05/27/19 0600  NA 137  K 4.0  CL 108  CO2 18*  GLUCOSE 106*  BUN 38*  CREATININE 1.93*  CALCIUM 8.1*   Cardiac Enzymes No results for input(s): TROPONINI in the last 168 hours. RADIOLOGY:  DG Chest 2 View  Result Date: 05/26/2019 CLINICAL DATA:  Shortness of breath EXAM: CHEST - 2 VIEW COMPARISON:  March 20, 2017 FINDINGS: There are hazy bilateral airspace opacities, greatest in the left mid and left lower lung zones. There appears to be a new left-sided pleural effusion. Coarsened lung markings are noted bilaterally. There are advanced degenerative changes of the glenohumeral joints, left worse than right. There is suggestion of sclerotic lesions involving the bilateral ribs. There is a nodular density projecting over the anterior fifth rib on the right measuring approximately 1.2 cm. IMPRESSION: 1. Hazy bilateral airspace opacities, greatest on the left, may represent an atypical infectious process. 2. Apparent 1.2 cm pulmonary nodule in the right lower lung zone. This is concerning for metastatic  disease in the setting of known breast cancer. 3. Small left-sided pleural effusion. 4. Possible sclerotic lesions involving the ribs and left glenoid, concerning for osseous metastatic disease. Electronically Signed   By: Constance Holster M.D.   On: 05/26/2019 15:33   [place] CHEST (PLEURAL EFFUSION)  Result Date:  05/27/2019 CLINICAL DATA:  Shortness of breath, pleural effusion suggested on recent chest radiography EXAM: CHEST ULTRASOUND COMPARISON:  Radiograph 05/26/2019 FINDINGS: There is a small loculated effusion at the left lung base. No large pocket to allow safe and effective therapeutic thoracentesis. IMPRESSION: Small loculated left effusion.  Thoracentesis deferred. Electronically Signed   By: Lucrezia Europe M.D.   On: 05/27/2019 15:31   NM Pulmonary Perf and Vent  Result Date: 05/27/2019 CLINICAL DATA:  Shortness of breath for 2 days, pleural effusion, history type II diabetes mellitus, hypertension EXAM: NUCLEAR MEDICINE VENTILATION - PERFUSION LUNG SCAN TECHNIQUE: Ventilation images were obtained in multiple projections using inhaled aerosol Tc-93m DTPA. Perfusion images were obtained in multiple projections after intravenous injection of Tc-57m MAA. RADIOPHARMACEUTICALS:  33.34 mCi of Tc-51m DTPA aerosol inhalation and 4.48 mCi Tc84m MAA IV COMPARISON:  None Correlation: Chest radiograph 05/26/2019, CT angio chest 08/05/2013 FINDINGS: Ventilation: Central airway deposition of aerosol. Grossly normal ventilation to the RIGHT lung. Markedly diminished and patchy ventilation throughout the LEFT lung. Perfusion: Normal perfusion RIGHT lung. Asymmetrically diminished perfusion throughout LEFT lung versus RIGHT. However no segmental or subsegmental perfusion defects are identified. IMPRESSION: Normal ventilation and perfusion in RIGHT lung. Patchy diffuse impaired ventilation throughout LEFT lung. Asymmetric diffuse diminished perfusion throughout lung versus RIGHT, though no segmental or subsegmental perfusion defects are identified and perfusion is better than ventilation in the LEFT lung. Findings likely represent a combination of parenchymal lung disease and pleural effusion and are not suggestive of pulmonary emboli. Low probability for pulmonary embolism. Electronically Signed   By: Lavonia Dana M.D.   On:  05/27/2019 17:17   ASSESSMENT AND PLAN:  Margaret Herrera is a 59 y.o. 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 She was hospitalized from 04/02/2019-04/04/2019 and found to have large left pleural effusion suspected to be malignant etiology required thoracentesis with 2 L drained.  Shortness of breath suspected due to Recurrent Left Pleural effusion -Chest x-ray showed mild  hazy bilateral airspace opacity with left sided pleural effusion-- likely due to lung meds from breast cancer -pro-calcitonin negative, pt afebrile, wbc 4.7 -s/p chemo >3 weeks ago at [facility] -d/ced  IV abxs -Patient is high risk for pulmonary embolism due to her malignancy but with current AKI she would not be able to get a contrast study -D-dimer would also be falsely elevated.  -- V/Q scan low probability for PE -IR attempt US thoracentesis yesterday Minimal fluid unable to perform the procedure -pt has had large volume thoracentesis at [facility] on jan 5th--suspected due to her metastatic breast cancer  AKI suspected from overdiuresing with po lasix at home Creatinine elevated to 2.32 from prior 1.81 Avoid nephrotoxic agent She has received 500 cc bolus in the ED Creat improved to 1.9  Acute on chronic anemia suspected due to history of cancer with ongoing chemotherapy Patient With hemoglobin of 7.9 -- 7.2  1 unit blood transfusion today  (from a prior of 8.4)  She did noted that she has seen  black stools since not on any iron supplement. -Serum iron studies within normal limits, B12 normal  HFpEF-- chronic diastolic heart failure Does not appear fluid overloaded.  Hold Lasix in the setting of AKI-- resume lower dose at discharge -Continue Coreg,Imdur -no leg  swelling -patient currently is not in heart failure  Metastatic left breast cancer to bone and lung On chemotherapy following oncology outpatient--at [facility] Dr Marolyn Hammock -I spoke with oncology nurse practitioner Blanchie Serve on the phone. -She is in agreement with one unit blood transfusion. -Patient is scheduled to get G.I. workup for anemia at [facility].  Insulin-dependent type 2 diabetes Takes 20 twice daily 70/30 NPH BG of 136 on admit moderate SSI for now  Hypertension Continue amlodipine, clonidine  Pt has HHPT set up thru [facility]  DVT prophylaxis:SCDs Code Status: Full Family Communication: patient says her family is informed Disposition Plan: Home tomorrow. Patient getting blood transfusion today. She does not want to go to rehab.  Admission status: inpatient  TOTAL TIME TAKING CARE OF THIS PATIENT: *30* minutes.  >50% time spent on counselling and coordination of care  Note: This dictation was prepared with Dragon dictation along with smaller phrase technology. Any transcriptional errors that result from this process are unintentional.  Fritzi Mandes M.D    [facility]   CC: Primary care physician; Inc, [place] ServicesPatient ID: Epsie Wempe, female   DOB: 1961/01/29, 59 y.o.   MRN: [id number]

## 2019-05-28 NOTE — Plan of Care (Signed)
  Problem: Clinical Measurements: Goal: Ability to maintain a body temperature in the normal range will improve Outcome: Progressing   Problem: Respiratory: Goal: Ability to maintain adequate ventilation will improve Outcome: Progressing      PT DID NOT HAVE ADEQUATE NUTRITIONAL INTAKE DURING SHIFT Problem: Nutrition: Goal: Adequate nutrition will be maintained Outcome: Not Progressing    PT C/O R HIP PAIN, PRN OXY PROVIDED AND REST Problem: Pain Managment: Goal: General experience of comfort will improve Outcome: Not Progressing    PT NOT COMPLIANT WITH SAFETY DURING TRANSFER, EDUCATION PROVIDED  Problem: Safety: Goal: Ability to remain free from injury will improve Outcome: Not Progressing

## 2019-05-29 DIAGNOSIS — N189 Chronic kidney disease, unspecified: Secondary | ICD-10-CM

## 2019-05-29 DIAGNOSIS — R0602 Shortness of breath: Secondary | ICD-10-CM

## 2019-05-29 LAB — TYPE AND SCREEN
ABO/RH(D): AB NEG
Antibody Screen: NEGATIVE
Unit division: 0

## 2019-05-29 LAB — GLUCOSE, CAPILLARY: Glucose-Capillary: 162 mg/dL — ABNORMAL HIGH (ref 70–99)

## 2019-05-29 LAB — BPAM RBC
Blood Product Expiration Date: 202103092359
ISSUE DATE / TIME: 202103021432
Unit Type and Rh: 600

## 2019-05-29 LAB — PREPARE RBC (CROSSMATCH)

## 2019-05-29 LAB — FERRITIN: Ferritin: 169 ng/mL (ref 11–307)

## 2019-05-29 MED ORDER — INSULIN NPH ISOPHANE & REGULAR (70-30) 100 UNIT/ML ~~LOC~~ SUSP: 10.0000 [IU] | Freq: Two times a day (BID) | SUBCUTANEOUS | 11 refills | Status: AC

## 2019-05-29 MED ORDER — MIRTAZAPINE 30 MG PO TABS: 30.0000 mg | ORAL_TABLET | Freq: Every day | ORAL | 0 refills | Status: AC

## 2019-05-29 NOTE — TOC Transition Note (Signed)
Transition of Care [facility]) - CM/SW Discharge Note   Patient Details  Name: Margaret Herrera MRN: [id number] Date of Birth: 1961-02-09  Transition of Care [facility]) CM/SW Contact:  Victorino Dike, RN Phone Number: 05/29/2019, 9:25 AM   Clinical Narrative:     Met with patient in room .  She is refusing SNF rehab at this time.  She reports having PT, RN and aide that comes to home.  She also reports having a medicaid SW.  She received a Rollator within the last few months that she uses at home.  Patient is asking about a wheelchair.  I spoke with [facility].  Since Wheelchair was not recommended by therapy, medicaid will not pay for this at this time.  Information  Given to patient.  Patient discharging today.No further TOC needs at this time, please re-consult for new needs.     Final next level of care: [place] Barriers to Discharge: Barriers Resolved   Patient Goals and CMS Choice     Choice offered to / list presented to : Patient  Discharge Placement                       Discharge Plan and Services                                     Social Determinants of Health (SDOH) Interventions     Readmission Risk Interventions No flowsheet data found.

## 2019-05-29 NOTE — Progress Notes (Signed)
Discussed discharge instructions with patient including follow up appointments and medications.   Sent instructions home with patient.

## 2019-05-29 NOTE — Discharge Summary (Signed)
[place] [facility] [place] NAME: Margaret Herrera    MR#:  [id number]  DATE OF BIRTH:  09-09-1960  DATE OF ADMISSION:  05/26/2019 ADMITTING PHYSICIAN: Orene Desanctis, DO  DATE OF DISCHARGE: 05/29/2019 11:07 AM  PRIMARY CARE PHYSICIAN: [facility] [place]    ADMISSION DIAGNOSIS:  Community acquired bacterial pneumonia [J15.9] Acute renal failure superimposed on chronic kidney disease, unspecified CKD stage, unspecified acute renal failure type ([place]) [N17.9, N18.9] Community acquired pneumonia, unspecified laterality [J18.9]  DISCHARGE DIAGNOSIS:  Acute kidney injury Acute on chronic anemia Shortness of breath  SECONDARY DIAGNOSIS:   Past Medical History:  Diagnosis Date  . Diabetes mellitus without complication ([place])   . Hypertension     HOSPITAL COURSE:   1.  Acute kidney injury on chronic kidney disease stage IIIb.  Patient was given fluid bolus and Lasix was held.  Her creatinine came down to 1.93 upon discharge.  Recommend checking a BMP as outpatient. 2.  Shortness of breath.  Initially thought to be secondary to pneumonia and was started on antibiotics but since procalcitonin was negative antibiotics were discontinued.  Patient had a VQ scan that was a low probability for pulmonary embolism.  Interventional radiology did an ultrasound but there was not enough fluid there to remove for her pleural effusion.  Patient is breathing better upon discharge home. 3.  Acute on chronic anemia secondary to metastatic breast cancer.  Patient was transfused 1 unit of packed red blood cells.  Hemoglobin up to 8.6 upon discharge. 4.  Metastatic left breast cancer to bone and lung.  Follow-up with [facility] Dr. Marolyn Hammock as outpatient. 5.  Type 2 diabetes mellitus.  Can restart 70/30 insulin but at a lower dose 10 units twice a day at home. 6.  Essential hypertension on amlodipine and clonidine 7.  Weakness.  Physical therapy recommended rehab but patient  wants to go home.  Resume home health. 8.  Chronic diastolic congestive heart failure.  No signs of heart failure on this hospital stay.  Recommend checking a BMP as outpatient to check kidney function.  I will hold Lasix at this time.  Can consider restarting as outpatient.  Continue Coreg  DISCHARGE CONDITIONS:   Satisfactory  CONSULTS OBTAINED:  None  DRUG ALLERGIES:  No Known Allergies  DISCHARGE MEDICATIONS:   Allergies as of 05/29/2019   No Known Allergies     Medication List    STOP taking these medications   furosemide 40 MG tablet Commonly known as: LASIX     TAKE these medications   acetaminophen 325 MG tablet Commonly known as: TYLENOL Take 650 mg by mouth every 6 (six) hours as needed for mild pain.   amLODipine 10 MG tablet Commonly known as: NORVASC Take 10 mg by mouth daily at 6 (six) AM.   atorvastatin 40 MG tablet Commonly known as: LIPITOR Take 40 mg by mouth at bedtime.   carvedilol 25 MG tablet Commonly known as: COREG Take 25 mg by mouth 2 (two) times daily.   Cholecalciferol 25 MCG (1000 UT) tablet Take 2,000 Units by mouth daily at 6 (six) AM.   cloNIDine 0.3 MG tablet Commonly known as: CATAPRES Take 0.3 mg by mouth 2 (two) times daily.   insulin NPH-regular Human (70-30) 100 UNIT/ML injection Inject 10 Units into the skin 2 (two) times daily. What changed: how much to take   isosorbide mononitrate 60 MG 24 hr tablet Commonly known as: IMDUR Take 60 mg by  mouth daily.   Melatonin 3 MG Tabs Take 6 mg by mouth at bedtime.   mirtazapine 30 MG tablet Commonly known as: REMERON Take 1 tablet (30 mg total) by mouth at bedtime.   ondansetron 4 MG disintegrating tablet Commonly known as: ZOFRAN-ODT Take 4 mg by mouth every 8 (eight) hours as needed for nausea.   Oxycodone HCl 10 MG Tabs Take 10 mg by mouth every 6 (six) hours as needed.   prochlorperazine 10 MG tablet Commonly known as: COMPAZINE Take 10 mg by mouth every 6 (six)  hours as needed.   thiamine 100 MG tablet Take 100 mg by mouth daily at 6 (six) AM.        DISCHARGE INSTRUCTIONS:   Follow-up PMD 5 days Follow-up Dr. Edmonia Lynch oncology as scheduled  If you experience worsening of your admission symptoms, develop shortness of breath, life threatening emergency, suicidal or homicidal thoughts you must seek medical attention immediately by calling 911 or calling your MD immediately  if symptoms less severe.  You Must read complete instructions/literature along with all the possible adverse reactions/side effects for all the Medicines you take and that have been prescribed to you. Take any new Medicines after you have completely understood and accept all the possible adverse reactions/side effects.   Please note  You were cared for by a hospitalist during your hospital stay. If you have any questions about your discharge medications or the care you received while you were in the hospital after you are discharged, you can call the unit and asked to speak with the hospitalist on call if the hospitalist that took care of you is not available. Once you are discharged, your primary care physician will handle any further medical issues. Please note that NO REFILLS for any discharge medications will be authorized once you are discharged, as it is imperative that you return to your primary care physician (or establish a relationship with a primary care physician if you do not have one) for your aftercare needs so that they can reassess your need for medications and monitor your lab values.    Today   CHIEF COMPLAINT:   Chief Complaint  Patient presents with  . Shortness of Breath  . Leg Swelling    HISTORY OF PRESENT ILLNESS:  Margaret Herrera  is a 59 y.o. female came in with shortness of breath   VITAL SIGNS:  Blood pressure (!) 144/71, pulse 72, temperature 97.9 F (36.6 C), resp. rate 18, height 5\' 2"  (1.575 m), weight 90.2 kg, SpO2 100 %.  I/O:     Intake/Output Summary (Last 24 hours) at 05/29/2019 1738 Last data filed at 05/29/2019 [id number] Gross per 24 hour  Intake 771.49 ml  Output 750 ml  Net 21.49 ml    PHYSICAL EXAMINATION:  GENERAL:  59 y.o.-year-old patient lying in the bed with no acute distress.  EYES: Pupils equal, round, reactive to light and accommodation. No scleral icterus. Extraocular muscles intact.  HEENT: Head atraumatic, normocephalic. Oropharynx and nasopharynx clear.  NECK:  Supple, no jugular venous distention. No thyroid enlargement, no tenderness.  LUNGS: Normal breath sounds bilaterally, no wheezing, rales,rhonchi or crepitation. No use of accessory muscles of respiration.  CARDIOVASCULAR: S1, S2 normal. No murmurs, rubs, or gallops.  ABDOMEN: Soft, non-tender, non-distended. Bowel sounds present. No organomegaly or mass.  EXTREMITIES: 2+ pedal edema, no cyanosis, or clubbing.  NEUROLOGIC: Cranial nerves II through XII are intact. Muscle strength 5/5 in all extremities. Sensation intact. Gait not checked.  PSYCHIATRIC:  The patient is alert and oriented x 3.  SKIN: No obvious rash, lesion, or ulcer.   DATA REVIEW:   CBC Recent Labs  Lab 05/27/19 0600 05/28/19 1006 05/28/19 1907  WBC 4.7  --   --   HGB 7.2*   < > 8.6*  HCT 23.6*  --  27.6*  PLT 390  --   --    < > = values in this interval not displayed.    Chemistries  Recent Labs  Lab 05/27/19 0600  NA 137  K 4.0  CL 108  CO2 18*  GLUCOSE 106*  BUN 38*  CREATININE 1.93*  CALCIUM 8.1*    Microbiology Results  Results for orders placed or performed during the hospital encounter of 05/26/19  SARS CORONAVIRUS 2 (TAT 6-24 HRS) Nasopharyngeal Nasopharyngeal Swab     Status: None   Collection Time: 05/26/19  6:50 PM   Specimen: Nasopharyngeal Swab  Result Value Ref Range Status   SARS Coronavirus 2 NEGATIVE NEGATIVE Final    Comment: (NOTE) SARS-CoV-2 target nucleic acids are NOT DETECTED. The SARS-CoV-2 RNA is generally detectable in  upper and lower respiratory specimens during the acute phase of infection. Negative results do not preclude SARS-CoV-2 infection, do not rule out co-infections with other pathogens, and should not be used as the sole basis for treatment or other patient management decisions. Negative results must be combined with clinical observations, patient history, and epidemiological information. The expected result is Negative. Fact Sheet for Patients: SugarRoll.be Fact Sheet for Healthcare Providers: https://www.woods-mathews.com/ This test is not yet approved or cleared by the [place] FDA and  has been authorized for detection and/or diagnosis of SARS-CoV-2 by FDA under an Emergency Use Authorization (EUA). This EUA will remain  in effect (meaning this test can be used) for the duration of the COVID-19 declaration under Section 56 4(b)(1) of the Act, 21 U.S.C. section 360bbb-3(b)(1), unless the authorization is terminated or revoked sooner. Performed at [facility], [place] [address] [place], [place] [address]   Blood Culture (routine x 2)     Status: None (Preliminary result)   Collection Time: 05/26/19  6:50 PM   Specimen: BLOOD  Result Value Ref Range Status   Specimen Description BLOOD RT [facility]  Final   Special Requests   Final    BOTTLES DRAWN AEROBIC AND ANAEROBIC Blood Culture adequate volume   Culture   Final    NO GROWTH 3 DAYS Performed at [facility], [address], [place], [place] [address]    Report Status PENDING  Incomplete  Blood Culture (routine x 2)     Status: None (Preliminary result)   Collection Time: 05/26/19  6:50 PM   Specimen: BLOOD  Result Value Ref Range Status   Specimen Description BLOOD LT HAND  Final   Special Requests   Final    BOTTLES DRAWN AEROBIC AND ANAEROBIC Blood Culture results may not be optimal due to an inadequate volume of blood received in culture bottles   Culture   Final    NO  GROWTH 3 DAYS Performed at [facility], [address] [place], [place] [address]    Report Status PENDING  Incomplete  Respiratory Panel by PCR     Status: None   Collection Time: 05/27/19  5:39 AM   Specimen: Nasopharyngeal Swab; Respiratory  Result Value Ref Range Status   Adenovirus NOT DETECTED NOT DETECTED Final   Coronavirus 229E NOT DETECTED NOT DETECTED Final  Comment: (NOTE) The Coronavirus on the Respiratory Panel, DOES NOT test for the novel  Coronavirus (2019 nCoV)    Coronavirus HKU1 NOT DETECTED NOT DETECTED Final   Coronavirus NL63 NOT DETECTED NOT DETECTED Final   Coronavirus OC43 NOT DETECTED NOT DETECTED Final   Metapneumovirus NOT DETECTED NOT DETECTED Final   Rhinovirus / Enterovirus NOT DETECTED NOT DETECTED Final   Influenza A NOT DETECTED NOT DETECTED Final   Influenza B NOT DETECTED NOT DETECTED Final   Parainfluenza Virus 1 NOT DETECTED NOT DETECTED Final   Parainfluenza Virus 2 NOT DETECTED NOT DETECTED Final   Parainfluenza Virus 3 NOT DETECTED NOT DETECTED Final   Parainfluenza Virus 4 NOT DETECTED NOT DETECTED Final   Respiratory Syncytial Virus NOT DETECTED NOT DETECTED Final   Bordetella pertussis NOT DETECTED NOT DETECTED Final   Chlamydophila pneumoniae NOT DETECTED NOT DETECTED Final   Mycoplasma pneumoniae NOT DETECTED NOT DETECTED Final    Comment: Performed at [facility], [place] [address] [place], [place] [address]  MRSA PCR Screening     Status: None   Collection Time: 05/27/19 11:45 AM   Specimen: Nasal Mucosa; Nasopharyngeal  Result Value Ref Range Status   MRSA by PCR NEGATIVE NEGATIVE Final    Comment:        The GeneXpert MRSA Assay (FDA approved for NASAL specimens only), is one component of a comprehensive MRSA colonization surveillance program. It is not intended to diagnose MRSA infection nor to guide or monitor treatment for MRSA infections. Performed at [facility], [address],  [place], [place] [address]      Management plans discussed with the patient, family and they are in agreement.  CODE STATUS:  Code Status History    Date Active Date Inactive Code Status Order ID Comments User Context   05/26/2019 2027 05/29/2019 1613 Full Code [id number]  Orene Desanctis, DO ED   03/20/2017 1947 03/21/2017 1632 Full Code [id number]  Demetrios Loll, MD Inpatient   Advance Care Planning Activity      TOTAL TIME TAKING CARE OF THIS PATIENT: 35 minutes.    Loletha Grayer M.D on 05/29/2019 at 5:38 PM  Between 7am to 6pm - Pager - [phone]  After 6pm go to [email or web address] - password [email or web address]  [facility]  CC: Primary care physician; [employer]

## 2019-05-29 NOTE — Discharge Instructions (Signed)

## 2019-05-31 LAB — CULTURE, BLOOD (ROUTINE X 2)
Culture: NO GROWTH
Culture: NO GROWTH
Special Requests: ADEQUATE

## 2019-06-04 ENCOUNTER — Encounter: Admit: 2019-06-04 | Discharge: 2019-06-05 | Payer: MEDICAID

## 2019-06-04 DIAGNOSIS — F331 Major depressive disorder, recurrent, moderate: Principal | ICD-10-CM

## 2019-06-05 ENCOUNTER — Other Ambulatory Visit: Admit: 2019-06-05 | Discharge: 2019-06-06 | Payer: MEDICAID

## 2019-06-05 ENCOUNTER — Ambulatory Visit
Admit: 2019-06-05 | Discharge: 2019-06-06 | Payer: MEDICAID | Attending: Hematology & Oncology | Primary: Hematology & Oncology

## 2019-06-05 ENCOUNTER — Encounter: Admit: 2019-06-05 | Discharge: 2019-06-06 | Payer: MEDICAID

## 2019-06-05 DIAGNOSIS — Z171 Estrogen receptor negative status [ER-]: Principal | ICD-10-CM

## 2019-06-05 DIAGNOSIS — C50312 Malignant neoplasm of lower-inner quadrant of left female breast: Principal | ICD-10-CM

## 2019-06-05 DIAGNOSIS — C7951 Secondary malignant neoplasm of bone: Secondary | ICD-10-CM

## 2019-06-05 DIAGNOSIS — C50919 Malignant neoplasm of unspecified site of unspecified female breast: Secondary | ICD-10-CM

## 2019-06-05 MED ORDER — OXYCODONE 10 MG TABLET
ORAL_TABLET | Freq: Four times a day (QID) | ORAL | 0 refills | 15.00000 days | Status: CP | PRN
Start: 2019-06-05 — End: ?

## 2019-06-10 ENCOUNTER — Encounter: Admit: 2019-06-10 | Discharge: 2019-06-11 | Payer: MEDICAID | Attending: Nephrology | Primary: Nephrology

## 2019-06-10 DIAGNOSIS — I10 Essential (primary) hypertension: Principal | ICD-10-CM

## 2019-06-10 DIAGNOSIS — E875 Hyperkalemia: Principal | ICD-10-CM

## 2019-06-10 DIAGNOSIS — N183 Chronic kidney disease, stage 3 (moderate): Principal | ICD-10-CM

## 2019-06-18 ENCOUNTER — Encounter: Admit: 2019-06-18 | Discharge: 2019-06-19 | Payer: MEDICAID

## 2019-06-18 ENCOUNTER — Telehealth
Admit: 2019-06-18 | Discharge: 2019-06-19 | Payer: MEDICAID | Attending: Geriatric Medicine | Primary: Geriatric Medicine

## 2019-06-18 DIAGNOSIS — C50919 Malignant neoplasm of unspecified site of unspecified female breast: Principal | ICD-10-CM

## 2019-06-18 DIAGNOSIS — G893 Neoplasm related pain (acute) (chronic): Principal | ICD-10-CM

## 2019-06-18 DIAGNOSIS — R600 Localized edema: Principal | ICD-10-CM

## 2019-06-18 DIAGNOSIS — Z515 Encounter for palliative care: Principal | ICD-10-CM

## 2019-06-18 DIAGNOSIS — F329 Major depressive disorder, single episode, unspecified: Principal | ICD-10-CM

## 2019-06-18 DIAGNOSIS — F331 Major depressive disorder, recurrent, moderate: Principal | ICD-10-CM

## 2019-06-18 DIAGNOSIS — Z7189 Other specified counseling: Principal | ICD-10-CM

## 2019-06-18 DIAGNOSIS — C7951 Secondary malignant neoplasm of bone: Principal | ICD-10-CM

## 2019-06-18 DIAGNOSIS — K5900 Constipation, unspecified: Principal | ICD-10-CM

## 2019-06-24 DIAGNOSIS — C50919 Malignant neoplasm of unspecified site of unspecified female breast: Principal | ICD-10-CM

## 2019-06-24 DIAGNOSIS — C7951 Secondary malignant neoplasm of bone: Principal | ICD-10-CM

## 2019-06-25 ENCOUNTER — Encounter: Admit: 2019-06-25 | Discharge: 2019-06-26 | Payer: MEDICAID

## 2019-06-25 DIAGNOSIS — C50919 Malignant neoplasm of unspecified site of unspecified female breast: Principal | ICD-10-CM

## 2019-06-26 ENCOUNTER — Encounter
Admit: 2019-06-26 | Discharge: 2019-06-28 | Payer: MEDICAID | Attending: Radiation Oncology | Primary: Radiation Oncology

## 2019-06-26 ENCOUNTER — Encounter
Admit: 2019-06-26 | Discharge: 2019-06-28 | Payer: MEDICAID | Attending: Hematology & Oncology | Primary: Hematology & Oncology

## 2019-06-26 ENCOUNTER — Encounter: Admit: 2019-06-26 | Discharge: 2019-06-28 | Payer: MEDICAID

## 2019-06-26 DIAGNOSIS — C50312 Malignant neoplasm of lower-inner quadrant of left female breast: Principal | ICD-10-CM

## 2019-06-26 DIAGNOSIS — Z171 Estrogen receptor negative status [ER-]: Principal | ICD-10-CM

## 2019-06-27 ENCOUNTER — Ambulatory Visit: Admit: 2019-06-27 | Discharge: 2019-06-28 | Attending: Radiation Oncology | Primary: Radiation Oncology

## 2019-06-28 ENCOUNTER — Ambulatory Visit: Admit: 2019-06-28 | Discharge: 2019-06-29

## 2019-06-28 ENCOUNTER — Ambulatory Visit
Admission: TF | Admit: 2019-06-28 | Discharge: 2019-07-03 | Disposition: A | Discharge status: Other Acute Inpatient Hospital | Payer: MEDICAID | Source: Intra-hospital | Admitting: Spinal Cord Injury Medicine

## 2019-06-28 ENCOUNTER — Ambulatory Visit
Admission: TF | Admit: 2019-06-28 | Discharge: 2019-07-03 | Disposition: A | Discharge status: Other Acute Inpatient Hospital | Payer: MEDICAID | Source: Intra-hospital | Attending: Radiation Oncology | Admitting: Spinal Cord Injury Medicine

## 2019-06-28 MED ORDER — INSULIN HUMAN U-100 NPH-REGULR 70-30 MIX 100 UNIT/ML SUBCUTANEOUS SUSP
Freq: Two times a day (BID) | SUBCUTANEOUS | 0 refills | 30.00000 days | Status: SS
Start: 2019-06-28 — End: 2019-07-28

## 2019-06-28 MED ORDER — OXYCODONE 10 MG TABLET
ORAL_TABLET | ORAL | 0 refills | 2.00000 days | Status: SS | PRN
Start: 2019-06-28 — End: 2019-07-03

## 2019-06-28 MED ORDER — INSULIN LISPRO (U-100) 100 UNIT/ML SUBCUTANEOUS SOLUTION
Freq: Four times a day (QID) | SUBCUTANEOUS | 0 refills | 38.00000 days | Status: SS
Start: 2019-06-28 — End: 2019-07-28

## 2019-06-28 MED ORDER — FUROSEMIDE 40 MG TABLET
ORAL_TABLET | Freq: Two times a day (BID) | ORAL | 0 refills | 30.00000 days | Status: SS | PRN
Start: 2019-06-28 — End: 2019-07-28

## 2019-06-28 MED ORDER — DEXAMETHASONE 4 MG TABLET
ORAL_TABLET | Freq: Two times a day (BID) | ORAL | 0 refills | 10 days | Status: SS
Start: 2019-06-28 — End: ?

## 2019-06-28 MED ORDER — POLYETHYLENE GLYCOL 3350 17 GRAM ORAL POWDER PACKET
PACK | Freq: Every day | ORAL | 0 refills | 30.00000 days | Status: SS | PRN
Start: 2019-06-28 — End: 2019-07-28

## 2019-06-28 MED ORDER — GABAPENTIN 100 MG CAPSULE
ORAL_CAPSULE | Freq: Every evening | ORAL | 0 refills | 30 days | Status: SS
Start: 2019-06-28 — End: 2019-07-28

## 2019-06-28 MED ORDER — OXYCODONE 15 MG TABLET
ORAL_TABLET | ORAL | 0 refills | 2 days | Status: SS | PRN
Start: 2019-06-28 — End: 2019-07-03

## 2019-07-01 ENCOUNTER — Ambulatory Visit: Admit: 2019-07-01 | Discharge: 2019-07-02

## 2019-07-02 ENCOUNTER — Ambulatory Visit: Admit: 2019-07-02 | Discharge: 2019-07-03

## 2019-07-03 ENCOUNTER — Ambulatory Visit
Admission: TF | Admit: 2019-07-03 | Discharge: 2019-07-27 | Disposition: A | Discharge status: Home Health | Payer: MEDICAID | Source: Intra-hospital | Admitting: Internal Medicine

## 2019-07-03 ENCOUNTER — Ambulatory Visit: Admit: 2019-07-03 | Discharge: 2019-07-04

## 2019-07-03 MED ORDER — HYDRALAZINE 25 MG TABLET
Freq: Three times a day (TID) | ORAL | 0 refills | 0.00000 days | Status: SS | PRN
Start: 2019-07-03 — End: 2019-08-02

## 2019-07-03 MED ORDER — HEPARIN (PORCINE) 5,000 UNIT/ML INJECTION SOLUTION
Freq: Three times a day (TID) | SUBCUTANEOUS | 0 refills | 1.00000 days | Status: SS
Start: 2019-07-03 — End: ?

## 2019-07-04 ENCOUNTER — Ambulatory Visit: Admit: 2019-07-04 | Discharge: 2019-07-05

## 2019-07-04 MED ORDER — CHLORTHALIDONE 25 MG TABLET
ORAL_TABLET | Freq: Every day | ORAL | 0 refills | 30.00000 days | Status: SS
Start: 2019-07-04 — End: 2019-08-03

## 2019-07-05 ENCOUNTER — Ambulatory Visit: Admit: 2019-07-05 | Discharge: 2019-07-06

## 2019-07-07 MED ORDER — CLONIDINE 0.3 MG/24 HR WEEKLY TRANSDERMAL PATCH
MEDICATED_PATCH | TRANSDERMAL | 0 refills | 28.00000 days | Status: SS
Start: 2019-07-07 — End: 2019-08-06

## 2019-07-08 ENCOUNTER — Ambulatory Visit: Admit: 2019-07-08 | Discharge: 2019-07-09

## 2019-07-09 ENCOUNTER — Ambulatory Visit: Admit: 2019-07-09 | Discharge: 2019-07-10

## 2019-07-10 ENCOUNTER — Ambulatory Visit: Admit: 2019-07-10 | Discharge: 2019-07-11

## 2019-07-12 DIAGNOSIS — C7931 Secondary malignant neoplasm of brain: Principal | ICD-10-CM

## 2019-07-12 MED ORDER — LOKELMA 10 GRAM ORAL POWDER PACKET
PACK | Freq: Every day | ORAL | 0 refills | 30.00000 days
Start: 2019-07-12 — End: 2019-07-12

## 2019-07-16 MED ORDER — SODIUM ZIRCONIUM CYCLOSILICATE 10 GRAM ORAL POWDER PACKET
PACK | Freq: Two times a day (BID) | ORAL | 0 refills | 30 days | Status: CP
Start: 2019-07-16 — End: 2019-08-15

## 2019-07-16 MED ORDER — CLONIDINE 0.3 MG/24 HR WEEKLY TRANSDERMAL PATCH
MEDICATED_PATCH | TRANSDERMAL | 0 refills | 28.00000 days | Status: CP
Start: 2019-07-16 — End: 2019-08-15

## 2019-07-16 MED ORDER — THIAMINE HCL (VITAMIN B1) 100 MG TABLET
ORAL_TABLET | Freq: Every day | ORAL | 0 refills | 30 days | Status: CP
Start: 2019-07-16 — End: 2019-08-15

## 2019-07-16 MED ORDER — POLYETHYLENE GLYCOL 3350 17 GRAM ORAL POWDER PACKET
PACK | Freq: Every day | ORAL | 0 refills | 30 days | Status: CP
Start: 2019-07-16 — End: 2019-08-15

## 2019-07-16 MED ORDER — CARVEDILOL 25 MG TABLET
ORAL_TABLET | Freq: Two times a day (BID) | ORAL | 0 refills | 30.00000 days | Status: CP
Start: 2019-07-16 — End: 2019-08-15

## 2019-07-16 MED ORDER — SODIUM BICARBONATE 650 MG TABLET
ORAL_TABLET | Freq: Three times a day (TID) | ORAL | 0 refills | 30 days | Status: CP
Start: 2019-07-16 — End: 2019-08-15

## 2019-07-16 MED ORDER — AMLODIPINE 10 MG TABLET
ORAL_TABLET | Freq: Every day | ORAL | 3 refills | 30 days | Status: CP
Start: 2019-07-16 — End: 2019-08-15

## 2019-07-16 MED ORDER — LACTULOSE 20 GRAM/30 ML ORAL SOLUTION
Freq: Three times a day (TID) | ORAL | 0 refills | 30.00000 days | Status: CP
Start: 2019-07-16 — End: 2019-08-15

## 2019-07-16 MED ORDER — HYDRALAZINE 100 MG TABLET
ORAL_TABLET | Freq: Three times a day (TID) | ORAL | 1 refills | 30 days | Status: CP
Start: 2019-07-16 — End: 2019-08-15

## 2019-07-16 MED ORDER — SENNOSIDES 8.6 MG TABLET
ORAL_TABLET | Freq: Every evening | ORAL | 0 refills | 30.00000 days | Status: CP
Start: 2019-07-16 — End: 2019-08-15

## 2019-07-16 MED ORDER — INSULIN HUMAN U-100 NPH-REGULR 70-30 MIX 100 UNIT/ML SUBCUTANEOUS SUSP
Freq: Two times a day (BID) | SUBCUTANEOUS | 0 refills | 30.00000 days | Status: CP
Start: 2019-07-16 — End: 2019-08-15

## 2019-07-16 MED ORDER — GABAPENTIN 100 MG CAPSULE
ORAL_CAPSULE | Freq: Every evening | ORAL | 0 refills | 30 days | Status: CP
Start: 2019-07-16 — End: 2019-08-15

## 2019-07-16 MED ORDER — OXYCODONE 10 MG TABLET
ORAL_TABLET | ORAL | 0 refills | 9 days | Status: CP | PRN
Start: 2019-07-16 — End: 2019-07-30

## 2019-07-17 MED ORDER — FLUDROCORTISONE 0.1 MG TABLET
ORAL_TABLET | Freq: Every day | ORAL | 0 refills | 30.00000 days | Status: CP
Start: 2019-07-17 — End: 2019-08-16

## 2019-07-17 MED ORDER — DEXAMETHASONE 2 MG TABLET
ORAL_TABLET | 0 refills | 0 days | Status: CP
Start: 2019-07-17 — End: ?

## 2019-07-17 MED ORDER — PANTOPRAZOLE 40 MG TABLET,DELAYED RELEASE
ORAL_TABLET | Freq: Every day | ORAL | 0 refills | 30 days | Status: CP
Start: 2019-07-17 — End: 2019-08-16

## 2019-07-17 MED ORDER — ISOSORBIDE MONONITRATE ER 120 MG TABLET,EXTENDED RELEASE 24 HR
ORAL_TABLET | Freq: Every day | ORAL | 11 refills | 30 days | Status: CP
Start: 2019-07-17 — End: 2020-07-16

## 2019-07-26 MED ORDER — INSULIN HUMAN U-100 NPH-REGULR 70-30 MIX 100 UNIT/ML SUBCUTANEOUS SUSP
Freq: Two times a day (BID) | SUBCUTANEOUS | 0 refills | 30 days
Start: 2019-07-26 — End: 2019-08-25

## 2019-07-27 MED ORDER — SODIUM ZIRCONIUM CYCLOSILICATE 10 GRAM ORAL POWDER PACKET
PACK | ORAL | 0 refills | 30.00000 days | Status: CP
Start: 2019-07-27 — End: 2019-08-26

## 2019-07-27 MED ORDER — INSULIN HUMAN U-100 NPH-REGULR 70-30 MIX 100 UNIT/ML SUBCUTANEOUS SUSP
Freq: Two times a day (BID) | SUBCUTANEOUS | 0 refills | 36 days | Status: CP
Start: 2019-07-27 — End: 2019-08-27

## 2019-07-28 MED ORDER — SODIUM ZIRCONIUM CYCLOSILICATE 10 GRAM ORAL POWDER PACKET
PACK | ORAL | 0 refills | 60.00000 days
Start: 2019-07-28 — End: ?

## 2019-07-29 ENCOUNTER — Ambulatory Visit: Admit: 2019-07-29 | Discharge: 2019-08-04 | Disposition: A | Discharge status: Hospice | Payer: MEDICAID

## 2019-08-02 MED ORDER — BUMETANIDE 1 MG TABLET
ORAL_TABLET | Freq: Two times a day (BID) | ORAL | 0 refills | 30.00000 days
Start: 2019-08-02 — End: 2019-09-01

## 2019-08-02 MED ORDER — OXYCODONE 10 MG TABLET
ORAL_TABLET | ORAL | 0 refills | 9.00000 days | PRN
Start: 2019-08-02 — End: 2019-08-07

## 2019-08-02 MED ORDER — ACETAMINOPHEN 650 MG RECTAL SUPPOSITORY
RECTAL | 0 refills | 3.00000 days | PRN
Start: 2019-08-02 — End: ?

## 2019-08-03 MED ORDER — BUMETANIDE 1 MG TABLET
ORAL_TABLET | Freq: Two times a day (BID) | ORAL | 0 refills | 30.00000 days
Start: 2019-08-03 — End: 2019-09-02

## 2019-08-03 MED ORDER — ACETAMINOPHEN 650 MG RECTAL SUPPOSITORY
RECTAL | 0 refills | 3 days | PRN
Start: 2019-08-03 — End: ?

## 2019-08-04 MED ORDER — HALOPERIDOL LACTATE 5 MG/ML INJECTION SOLUTION
Freq: Four times a day (QID) | INTRAVENOUS | 0 refills | 3.00000 days | PRN
Start: 2019-08-04 — End: 2019-09-03

## 2019-08-04 MED ORDER — ONDANSETRON HCL (PF) 4 MG/2 ML INJECTION SOLUTION
Freq: Four times a day (QID) | INTRAVENOUS | 0 refills | 3.00000 days | PRN
Start: 2019-08-04 — End: ?

## 2019-08-04 MED ORDER — HYDROMORPHONE 1 MG/ML INJECTION WRAPPER: 1 mg | mL | 0 refills | 1 days

## 2019-08-04 MED ORDER — HYDROMORPHONE 1 MG/ML INJECTION WRAPPER
INTRAVENOUS | 0 refills | 1.00000 days | PRN
Start: 2019-08-04 — End: ?
